# Patient Record
Sex: Female | Born: 1945
Health system: Southern US, Community
[De-identification: ages and names within clinical notes are randomized; demographics above are authoritative.]

## PROBLEM LIST (undated history)

## (undated) DIAGNOSIS — C4491 Basal cell carcinoma of skin, unspecified: Secondary | ICD-10-CM

## (undated) DIAGNOSIS — D122 Benign neoplasm of ascending colon: Secondary | ICD-10-CM

## (undated) DIAGNOSIS — D649 Anemia, unspecified: Secondary | ICD-10-CM

## (undated) DIAGNOSIS — M1990 Unspecified osteoarthritis, unspecified site: Secondary | ICD-10-CM

## (undated) DIAGNOSIS — R011 Cardiac murmur, unspecified: Secondary | ICD-10-CM

## (undated) DIAGNOSIS — E042 Nontoxic multinodular goiter: Secondary | ICD-10-CM

## (undated) DIAGNOSIS — R0600 Dyspnea, unspecified: Principal | ICD-10-CM

## (undated) DIAGNOSIS — K2100 Gastro-esophageal reflux disease with esophagitis, without bleeding: Secondary | ICD-10-CM

## (undated) DIAGNOSIS — R195 Other fecal abnormalities: Secondary | ICD-10-CM

## (undated) DIAGNOSIS — K219 Gastro-esophageal reflux disease without esophagitis: Secondary | ICD-10-CM

## (undated) DIAGNOSIS — H3530 Unspecified macular degeneration: Secondary | ICD-10-CM

## (undated) DIAGNOSIS — G4730 Sleep apnea, unspecified: Secondary | ICD-10-CM

## (undated) DIAGNOSIS — M810 Age-related osteoporosis without current pathological fracture: Secondary | ICD-10-CM

## (undated) HISTORY — DX: Sleep apnea, unspecified: G47.30

## (undated) HISTORY — DX: Basal cell carcinoma of skin, unspecified: C44.91

## (undated) HISTORY — DX: Cardiac murmur, unspecified: R01.1

## (undated) HISTORY — DX: Nontoxic multinodular goiter: E04.2

## (undated) HISTORY — DX: Gastro-esophageal reflux disease with esophagitis, without bleeding: K21.00

## (undated) HISTORY — DX: Anemia, unspecified: D64.9

## (undated) HISTORY — PX: OTHER SURGICAL HISTORY: SHX169

## (undated) HISTORY — DX: Other fecal abnormalities: R19.5

## (undated) HISTORY — DX: Unspecified macular degeneration: H35.30

## (undated) HISTORY — DX: Unspecified osteoarthritis, unspecified site: M19.90

## (undated) HISTORY — DX: Dyspnea, unspecified: R06.00

## (undated) HISTORY — DX: Age-related osteoporosis without current pathological fracture: M81.0

## (undated) HISTORY — DX: Gastro-esophageal reflux disease without esophagitis: K21.9

## (undated) HISTORY — DX: Benign neoplasm of ascending colon: D12.2

---

## 1986-05-26 HISTORY — PX: BREAST SURGERY: SHX581

## 2004-04-03 ENCOUNTER — Ambulatory Visit: Payer: Self-pay | Admitting: Internal Medicine

## 2004-05-26 HISTORY — PX: CARDIAC CATHETERIZATION: SHX172

## 2004-10-16 ENCOUNTER — Ambulatory Visit: Payer: Self-pay | Admitting: General Practice

## 2005-01-31 ENCOUNTER — Ambulatory Visit: Payer: Self-pay | Admitting: Cardiovascular Disease

## 2005-12-11 ENCOUNTER — Ambulatory Visit: Payer: Self-pay | Admitting: General Practice

## 2006-05-21 ENCOUNTER — Ambulatory Visit: Payer: Self-pay | Admitting: Gastroenterology

## 2006-12-24 ENCOUNTER — Ambulatory Visit: Payer: Self-pay | Admitting: Endocrinology

## 2008-04-12 ENCOUNTER — Emergency Department (HOSPITAL_COMMUNITY): Admission: EM | Admit: 2008-04-12 | Discharge: 2008-04-12 | Payer: Self-pay | Admitting: Emergency Medicine

## 2009-03-22 ENCOUNTER — Encounter (INDEPENDENT_AMBULATORY_CARE_PROVIDER_SITE_OTHER): Payer: Self-pay | Admitting: Internal Medicine

## 2009-03-22 ENCOUNTER — Ambulatory Visit: Payer: Self-pay | Admitting: Internal Medicine

## 2009-03-22 DIAGNOSIS — K449 Diaphragmatic hernia without obstruction or gangrene: Secondary | ICD-10-CM

## 2009-03-22 DIAGNOSIS — M1990 Unspecified osteoarthritis, unspecified site: Secondary | ICD-10-CM | POA: Insufficient documentation

## 2009-03-22 DIAGNOSIS — E042 Nontoxic multinodular goiter: Secondary | ICD-10-CM | POA: Insufficient documentation

## 2009-03-22 DIAGNOSIS — G4730 Sleep apnea, unspecified: Secondary | ICD-10-CM

## 2009-03-22 HISTORY — DX: Sleep apnea, unspecified: G47.30

## 2009-04-02 ENCOUNTER — Ambulatory Visit: Payer: Self-pay | Admitting: Internal Medicine

## 2009-04-02 LAB — CONVERTED CEMR LAB
ALT: 24 units/L (ref 0–35)
Alkaline Phosphatase: 65 units/L (ref 39–117)
BUN: 12 mg/dL (ref 6–23)
Basophils Relative: 0 % (ref 0–1)
Calcium: 9.3 mg/dL (ref 8.4–10.5)
Cholesterol: 172 mg/dL (ref 0–200)
Eosinophils Absolute: 0.2 10*3/uL (ref 0.0–0.7)
Eosinophils Relative: 3 % (ref 0–5)
Free T4: 1.32 ng/dL (ref 0.80–1.80)
HCT: 38.1 % (ref 36.0–46.0)
HDL: 58 mg/dL (ref >39)
LDL Cholesterol: 96 mg/dL (ref 0–99)
Lymphocytes Relative: 41 % (ref 12–46)
Lymphs Abs: 2 10*3/uL (ref 0.7–4.0)
MCHC: 30.4 g/dL (ref 30.0–36.0)
Monocytes Relative: 8 % (ref 3–12)
Neutro Abs: 2.3 10*3/uL (ref 1.7–7.7)
OCCULT 1: NEGATIVE
OCCULT 2: NEGATIVE
OCCULT 3: NEGATIVE
RBC: 4.35 M/uL (ref 3.87–5.11)
TSH: 2.787 microintl units/mL (ref 0.350–4.5)
Total CHOL/HDL Ratio: 3
Total Protein: 7.1 g/dL (ref 6.0–8.3)

## 2010-02-13 ENCOUNTER — Ambulatory Visit: Payer: Self-pay

## 2010-02-19 ENCOUNTER — Ambulatory Visit: Payer: Self-pay

## 2011-05-06 ENCOUNTER — Ambulatory Visit: Payer: Self-pay

## 2012-10-15 ENCOUNTER — Ambulatory Visit: Payer: Self-pay | Admitting: Family

## 2013-02-03 ENCOUNTER — Ambulatory Visit: Payer: Self-pay | Admitting: Gastroenterology

## 2013-09-28 ENCOUNTER — Encounter: Payer: Self-pay | Admitting: Internal Medicine

## 2013-09-28 ENCOUNTER — Ambulatory Visit (INDEPENDENT_AMBULATORY_CARE_PROVIDER_SITE_OTHER): Payer: Commercial Managed Care - HMO | Admitting: Internal Medicine

## 2013-09-28 VITALS — BP 146/71 | HR 60 | Temp 97.0°F | Ht 62.0 in | Wt 209.1 lb

## 2013-09-28 DIAGNOSIS — M1990 Unspecified osteoarthritis, unspecified site: Secondary | ICD-10-CM

## 2013-09-28 DIAGNOSIS — D649 Anemia, unspecified: Secondary | ICD-10-CM

## 2013-09-28 DIAGNOSIS — Z1239 Encounter for other screening for malignant neoplasm of breast: Secondary | ICD-10-CM

## 2013-09-28 DIAGNOSIS — Z862 Personal history of diseases of the blood and blood-forming organs and certain disorders involving the immune mechanism: Secondary | ICD-10-CM

## 2013-09-28 DIAGNOSIS — E041 Nontoxic single thyroid nodule: Secondary | ICD-10-CM

## 2013-09-28 DIAGNOSIS — J449 Chronic obstructive pulmonary disease, unspecified: Secondary | ICD-10-CM | POA: Insufficient documentation

## 2013-09-28 DIAGNOSIS — Z8639 Personal history of other endocrine, nutritional and metabolic disease: Secondary | ICD-10-CM

## 2013-09-28 DIAGNOSIS — R0600 Dyspnea, unspecified: Secondary | ICD-10-CM

## 2013-09-28 DIAGNOSIS — K219 Gastro-esophageal reflux disease without esophagitis: Secondary | ICD-10-CM

## 2013-09-28 DIAGNOSIS — G4730 Sleep apnea, unspecified: Secondary | ICD-10-CM

## 2013-09-28 DIAGNOSIS — J438 Other emphysema: Secondary | ICD-10-CM

## 2013-09-28 DIAGNOSIS — Z0000 Encounter for general adult medical examination without abnormal findings: Secondary | ICD-10-CM

## 2013-09-28 HISTORY — DX: Dyspnea, unspecified: R06.00

## 2013-09-28 LAB — CBC WITH DIFFERENTIAL/PLATELET
BASOS ABS: 0 10*3/uL (ref 0.0–0.1)
BASOS PCT: 0 % (ref 0–1)
EOS ABS: 0.1 10*3/uL (ref 0.0–0.7)
EOS PCT: 2 % (ref 0–5)
HCT: 35.6 % — ABNORMAL LOW (ref 36.0–46.0)
Hemoglobin: 11.7 g/dL — ABNORMAL LOW (ref 12.0–15.0)
Lymphocytes Relative: 28 % (ref 12–46)
Lymphs Abs: 1.8 10*3/uL (ref 0.7–4.0)
MCH: 27.9 pg (ref 26.0–34.0)
MCHC: 32.9 g/dL (ref 30.0–36.0)
MCV: 85 fL (ref 78.0–100.0)
Monocytes Absolute: 0.4 10*3/uL (ref 0.1–1.0)
Monocytes Relative: 6 % (ref 3–12)
NEUTROS ABS: 4.1 10*3/uL (ref 1.7–7.7)
Neutrophils Relative %: 64 % (ref 43–77)
PLATELETS: 262 10*3/uL (ref 150–400)
RBC: 4.19 MIL/uL (ref 3.87–5.11)
RDW: 14.9 % (ref 11.5–15.5)
WBC: 6.4 10*3/uL (ref 4.0–10.5)

## 2013-09-28 LAB — GLUCOSE, CAPILLARY: GLUCOSE-CAPILLARY: 86 mg/dL (ref 70–99)

## 2013-09-28 MED ORDER — DICLOFENAC SODIUM 75 MG PO TBEC: 75.0000 mg | DELAYED_RELEASE_TABLET | Freq: Two times a day (BID) | ORAL | Status: DC

## 2013-09-28 MED ORDER — OMEPRAZOLE 20 MG PO CPDR: 20.0000 mg | DELAYED_RELEASE_CAPSULE | Freq: Every day | ORAL | Status: DC

## 2013-09-28 MED ORDER — TIOTROPIUM BROMIDE MONOHYDRATE 18 MCG IN CAPS: 18.0000 ug | ORAL_CAPSULE | Freq: Every day | RESPIRATORY_TRACT | Status: DC

## 2013-09-28 MED ORDER — IPRATROPIUM-ALBUTEROL 0.5-2.5 (3) MG/3ML IN SOLN: RESPIRATORY_TRACT | Status: DC

## 2013-09-28 MED ORDER — ALBUTEROL SULFATE HFA 108 (90 BASE) MCG/ACT IN AERS: 2.0000 | INHALATION_SPRAY | Freq: Four times a day (QID) | RESPIRATORY_TRACT | Status: DC | PRN

## 2013-09-28 NOTE — Assessment & Plan Note (Addendum)
Patient reports that she had multiple nodules in her thyroid in the past. Not sure if she had any further work up. A nodule is palpable on exam today on the left thyroid lobe. Discussed with the attending regarding further management and plan.  Plans: Repeat US of the neck to check for thyroid nodules. Will follow up.

## 2013-09-28 NOTE — Assessment & Plan Note (Addendum)
We do not have her health records currently. Not sure how the diagnosis was made. But signs of emphysema on the CXR in 2009 and patient was on spiriva and albuterol in the past.  Plans: Refilled spiriva, albuterol inhalers. Gave prescription for duonebs as well. Will consider spirometry to assess her lung function.

## 2013-09-28 NOTE — Assessment & Plan Note (Addendum)
Patient states that she had never used BiPap as she couldn't sleep with it.  Plans: Recommended to reconsider BiPAP.

## 2013-09-28 NOTE — Assessment & Plan Note (Addendum)
Check routene labs - CBC, CMP, FBG.FLP. Referral to mammogram was made. Patient is uptodate on colonoscopy. Requested to get the reports of colonoscopy.

## 2013-09-28 NOTE — Progress Notes (Signed)
Subjective:   Patient ID: Madison Craig female   DOB: [birth year] 68 y.o.   MRN: [id number]  HPI: [name] is a 68 y.o. woman with PMH significant for  Emphysema, H/O thyroid nodule comes to the office to re-establish her care with the office.  Patient was seen here in 2010 but she states that she didn't have insurance then hence couldn't follow up with the office. Now that she has medicare, she wants to re-establish her care with the clinic.   Patient reports that she has DOE and has not been using her inhalers for the last 4-5 years. Patient is requesting refill of her inhalers.   Patient is complains of chronic knee, ankle, hip, shoulder pain and states that she takes ibuprofen for her chronic pains as needed. She is requesting for diclofenac instead of ibuprofen as they work better for her.   She states that she had a mammogram two years ago which was normal and is requesting for a referral for mammogram at [facility] in [place]. Patient reports that she had a colonoscopy in 2014 September which showed diverticuli.   She denies any other complaints during this office visit.    Past Medical History  Diagnosis Date  . Anemia   . Arthritis   . Emphysema of lung   . GERD (gastroesophageal reflux disease)   . Thyroid disease    Current Outpatient Prescriptions  Medication Sig Dispense Refill  . albuterol (PROVENTIL HFA;VENTOLIN HFA) 108 (90 BASE) MCG/ACT inhaler Inhale 2 puffs into the lungs every 6 (six) hours as needed for wheezing or shortness of breath.  1 Inhaler  2  . diclofenac (VOLTAREN) 75 MG EC tablet Take 1 tablet (75 mg total) by mouth 2 (two) times daily.  60 tablet  2  . ipratropium-albuterol (DUONEB) 0.5-2.5 (3) MG/3ML SOLN Take 3 ml by nebulization as needed for shortness of breath.  360 mL  3  . omeprazole (PRILOSEC) 20 MG capsule Take 1 capsule (20 mg total) by mouth daily.  30 capsule  2  . tiotropium (SPIRIVA HANDIHALER) 18 MCG inhalation  capsule Place 1 capsule (18 mcg total) into inhaler and inhale daily.  30 capsule  2   No current facility-administered medications for this visit.   Family History  Problem Relation Age of Onset  . Heart disease Father   . Diabetes Son    History   Social History  . Marital Status: Widowed    Spouse Name: N/A    Number of Children: N/A  . Years of Education: N/A   Social History Main Topics  . Smoking status: Former Smoker -- 2.00 packs/day    Types: Cigarettes    Quit date: 09/29/1978  . Smokeless tobacco: None  . Alcohol Use: No  . Drug Use: No  . Sexual Activity: None   Other Topics Concern  . None   Social History Narrative  . None   Review of Systems: Pertinent items are noted in HPI. Objective:  Physical Exam: Filed Vitals:   09/28/13 0844  BP: 146/71  Pulse: 60  Temp: 97 F (36.1 C)  TempSrc: Oral  Height: 5\' 2"  (1.575 m)  Weight: 209 lb 1.6 oz (94.847 kg)  SpO2: 99%   Constitutional: Vital signs reviewed.   Patient is a well-developed and well-nourished and is in no acute distress and cooperative with exam. Alert and oriented x3.  Head: Normocephalic and atraumatic Nose: No erythema or drainage noted.  Turbinates normal Mouth: no  erythema or exudates, MMM Eyes: PERRL, EOMI, conjunctivae normal, No scleral icterus.  Neck: Supple, Trachea midline normal ROM. One palpable thyroid nodule on the left side. No thyromegaly noted.  Cardiovascular: RRR, S1 normal, S2 normal, no MRG, pulses symmetric and intact bilaterally Pulmonary/Chest: normal respiratory effort, CTAB, no wheezes, rales, or rhonchi Abdominal: Soft. Non-tender, non-distended, bowel sounds are normal, no masses, organomegaly, or guarding present.  Musculoskeletal: No joint deformities, erythema, or stiffness, ROM full and no nontender Neurological: A&O x3, Strength is normal and symmetric bilaterally, cranial nerve II-XII are grossly intact, no focal motor deficit, sensory intact to light  touch bilaterally.  Skin: Warm, dry and intact.  Psychiatric: Normal mood and affect.    Assessment & Plan:

## 2013-09-28 NOTE — Patient Instructions (Signed)
Take all the medications as advised. Follow up with our office in 3 months. Please get the thyroid ultrasound done and we will follow up with the results.

## 2013-09-29 LAB — COMPLETE METABOLIC PANEL WITH GFR
ALK PHOS: 74 U/L (ref 39–117)
ALT: 16 U/L (ref 0–35)
AST: 17 U/L (ref 0–37)
Albumin: 4.1 g/dL (ref 3.5–5.2)
BILIRUBIN TOTAL: 0.5 mg/dL (ref 0.2–1.2)
BUN: 15 mg/dL (ref 6–23)
CALCIUM: 9.5 mg/dL (ref 8.4–10.5)
CO2: 29 mEq/L (ref 19–32)
Chloride: 102 mEq/L (ref 96–112)
Creat: 0.67 mg/dL (ref 0.50–1.10)
GFR, Est African American: >89 mL/min
GLUCOSE: 85 mg/dL (ref 70–99)
POTASSIUM: 4.3 meq/L (ref 3.5–5.3)
SODIUM: 137 meq/L (ref 135–145)
TOTAL PROTEIN: 7.2 g/dL (ref 6.0–8.3)

## 2013-09-29 LAB — LIPID PANEL
CHOLESTEROL: 140 mg/dL (ref 0–200)
HDL: 49 mg/dL (ref >39)
LDL CALC: 79 mg/dL (ref 0–99)
TRIGLYCERIDES: 59 mg/dL (ref <150)
Total CHOL/HDL Ratio: 2.9 Ratio
VLDL: 12 mg/dL (ref 0–40)

## 2013-09-29 NOTE — Progress Notes (Signed)
Case discussed with Dr. Boggala at the time of the visit.  We reviewed the resident's history and exam and pertinent patient test results.  I agree with the assessment, diagnosis, and plan of care documented in the resident's note. 

## 2013-09-30 ENCOUNTER — Ambulatory Visit: Payer: Self-pay | Admitting: Internal Medicine

## 2013-10-03 ENCOUNTER — Other Ambulatory Visit: Payer: Self-pay | Admitting: Internal Medicine

## 2013-10-03 DIAGNOSIS — E669 Obesity, unspecified: Secondary | ICD-10-CM

## 2013-10-25 ENCOUNTER — Ambulatory Visit: Payer: Self-pay

## 2014-01-11 ENCOUNTER — Encounter: Payer: Self-pay | Admitting: Internal Medicine

## 2014-01-11 ENCOUNTER — Ambulatory Visit (INDEPENDENT_AMBULATORY_CARE_PROVIDER_SITE_OTHER): Payer: Commercial Managed Care - HMO | Admitting: Internal Medicine

## 2014-01-11 VITALS — BP 138/77 | HR 62 | Temp 97.1°F | Wt 211.0 lb

## 2014-01-11 DIAGNOSIS — K219 Gastro-esophageal reflux disease without esophagitis: Secondary | ICD-10-CM

## 2014-01-11 DIAGNOSIS — Z23 Encounter for immunization: Secondary | ICD-10-CM

## 2014-01-11 DIAGNOSIS — D500 Iron deficiency anemia secondary to blood loss (chronic): Secondary | ICD-10-CM | POA: Insufficient documentation

## 2014-01-11 DIAGNOSIS — H9190 Unspecified hearing loss, unspecified ear: Secondary | ICD-10-CM

## 2014-01-11 DIAGNOSIS — E041 Nontoxic single thyroid nodule: Secondary | ICD-10-CM

## 2014-01-11 DIAGNOSIS — J438 Other emphysema: Secondary | ICD-10-CM

## 2014-01-11 DIAGNOSIS — H9193 Unspecified hearing loss, bilateral: Secondary | ICD-10-CM

## 2014-01-11 DIAGNOSIS — D649 Anemia, unspecified: Secondary | ICD-10-CM

## 2014-01-11 DIAGNOSIS — H3530 Unspecified macular degeneration: Secondary | ICD-10-CM | POA: Insufficient documentation

## 2014-01-11 HISTORY — DX: Unspecified macular degeneration: H35.30

## 2014-01-11 LAB — TSH: TSH: 1.767 u[IU]/mL (ref 0.350–4.500)

## 2014-01-11 LAB — FERRITIN: Ferritin: 8 ng/mL — ABNORMAL LOW (ref 10–291)

## 2014-01-11 LAB — T4, FREE: FREE T4: 1.39 ng/dL (ref 0.80–1.80)

## 2014-01-11 NOTE — Assessment & Plan Note (Addendum)
Assessment: Symptoms are well controlled on omeprazole 40 mg daily.    Plan: Continue omeprazole 40 mg daily.  

## 2014-01-11 NOTE — Assessment & Plan Note (Signed)
Assessment: Patient reports that this is a chronic problem that started years ago, and that she has had a prior hearing test done.  Plan: Try to obtain outside records a prior workup.

## 2014-01-11 NOTE — Assessment & Plan Note (Addendum)
Lab Results  Component Value Date   HGB 11.7* 09/28/2013   HGB 11.6* 04/02/2009   MCV 85.0 09/28/2013   MCV 87.6 04/02/2009     Assessment: Patient has mild normocytic anemia.  She is asymptomatic.  Plan: Check ferritin level today.

## 2014-01-11 NOTE — Progress Notes (Signed)
   Subjective:    Patient ID: Madison Craig, female    DOB: [birth year], 68 y.o.   MRN: [id number]  HPI This is the first visit to my continuity clinic for Madison Craig, a patient who was previously followed in our clinic in 2010, who was seen here in May by Dr. Eyvonne Mechanic in order to re-establish care here.  Today she has no acute complaints.  She has several chronic complaints including: stable exertional dyspnea; occasional leg swelling which resolves when she elevates her legs at night; chronic hearing loss for more than 7 years which she attributes to having worked in [employer], worse on the right side; chronic joint pains involving ankles, knees, wrists/hands, and arms/shoulders.  She reports a history of macular degeneration followed by an ophthalmologist at [facility] in [place]; she reports that she was last seen there about 1 year ago.  She reports occasional forgetfulness but nothing significant.  She reports a history of obstructive sleep apnea diagnosed years ago by a sleep study in [place]; she reports that she has a BiPAP machine but has never used it because she was unable to sleep with it on.  She has GERD, and says her symptoms are well controlled on omeprazole 40 mg daily.  She has not been using her Spiriva regularly, and says she uses it mainly when she is having an increase in her dyspnea which occurs more in the fall.  She uses her albuterol inhaler on an as-needed basis only.  I reviewed and updated the past medical history, past surgical history, family history, and social history  Review of Systems  Constitutional: Negative for fever, chills and diaphoresis.  HENT: Positive for hearing loss. Negative for ear discharge and ear pain.   Eyes: Negative for pain.  Respiratory: Positive for shortness of breath (Chronic exertional dyspnea). Negative for cough and wheezing.   Cardiovascular: Positive for leg swelling (Occasional). Negative for chest pain.  Gastrointestinal:  Negative for nausea, vomiting, abdominal pain, diarrhea, blood in stool and anal bleeding.  Genitourinary: Negative for dysuria, urgency and difficulty urinating.  Musculoskeletal: Positive for arthralgias (Ankles, knees, wrists/hands, arms/shoulders) and myalgias.  Skin: Negative for rash.  Neurological: Negative for dizziness, syncope, weakness and numbness.       Objective:   Physical Exam  Constitutional: No distress.  Cardiovascular: Normal rate, regular rhythm and normal heart sounds.  Exam reveals no gallop and no friction rub.   No murmur heard. Pulmonary/Chest: Effort normal and breath sounds normal. No respiratory distress. She has no wheezes. She has no rales.  Abdominal: Soft. Bowel sounds are normal. There is no tenderness.  Musculoskeletal: She exhibits no edema.       Assessment & Plan:

## 2014-01-11 NOTE — Assessment & Plan Note (Addendum)
Assessment: Patient reports that this is followed by [facility] in [place].  Plan: I advised patient to followup with her ophthalmologist.

## 2014-01-11 NOTE — Assessment & Plan Note (Addendum)
Assessment: Patient reports reasonable control of her symptoms; she uses her albuterol inhaler on an as-needed basis; she uses her Spiriva intermittently depending on the level of her symptoms; she has not recently been using the Combivent nebs although she has it available.  She reports that she has never had pulmonary function studies.  Plan: Continue inhaled bronchodilator regimen.  Refer for pulmonary function tests.

## 2014-01-11 NOTE — Assessment & Plan Note (Addendum)
Assessment: Patient has a history of multiple thyroid nodules.  Thyroid ultrasound was ordered at the time of her last visit, but has not been done.  Plan: Check TSH and free T4; obtain thyroid ultrasound as previously ordered.

## 2014-01-11 NOTE — Patient Instructions (Signed)
A referral has been made for pulmonary function tests. A referral has been made for a thyroid ultrasound. Please ask your pharmacist what the monthly co-pay would be for an ipratropium (Atrovent) inhaler.

## 2014-01-20 ENCOUNTER — Telehealth: Payer: Self-pay | Admitting: Internal Medicine

## 2014-01-20 DIAGNOSIS — D649 Anemia, unspecified: Secondary | ICD-10-CM

## 2014-01-20 MED ORDER — FERROUS SULFATE 325 (65 FE) MG PO TABS: 325.0000 mg | ORAL_TABLET | Freq: Two times a day (BID) | ORAL | Status: DC

## 2014-01-20 NOTE — Telephone Encounter (Signed)
Telephone Contact Note  Lab Results  Component Value Date   HGB 11.7* 09/28/2013   MCV 85.0 09/28/2013    Lab Results  Component Value Date   FERRITIN 8* 01/11/2014     Assessment: Patient has mild normocytic anemia.  Ferritin is low.    Plan: Plan is start ferrous sulfate 325 mg twice a day.  I called patient at home and discussed this with her.

## 2014-01-20 NOTE — Assessment & Plan Note (Addendum)
Telephone Contact Note  Lab Results  Component Value Date   HGB 11.7* 09/28/2013   MCV 85.0 09/28/2013    Lab Results  Component Value Date   FERRITIN 8* 01/11/2014     Assessment: Patient has mild normocytic anemia.  Ferritin is low.    Plan: Plan is start ferrous sulfate 325 mg twice a day.  I called patient at home and discussed this with her.

## 2014-01-25 ENCOUNTER — Other Ambulatory Visit: Payer: Self-pay | Admitting: Internal Medicine

## 2014-01-25 ENCOUNTER — Telehealth: Payer: Self-pay | Admitting: Internal Medicine

## 2014-01-25 ENCOUNTER — Ambulatory Visit (HOSPITAL_COMMUNITY)
Admission: RE | Admit: 2014-01-25 | Discharge: 2014-01-25 | Disposition: A | Discharge status: Home / Self Care | Payer: Medicare PPO | Source: Ambulatory Visit | Attending: Internal Medicine | Admitting: Internal Medicine

## 2014-01-25 DIAGNOSIS — E042 Nontoxic multinodular goiter: Secondary | ICD-10-CM | POA: Insufficient documentation

## 2014-01-25 DIAGNOSIS — E041 Nontoxic single thyroid nodule: Secondary | ICD-10-CM

## 2014-01-25 NOTE — Telephone Encounter (Signed)
Called and spoke with [name] regarding the results of her thyroid ultrasound.  The ultrasound showed a nodule that does meet criteria for biopsy.  I have ordered an US guided biopsy and [name] is aware someone will be calling her to schedule this study.   I also spoke with [facility] who will assist in scheduling.   [name] also was concerned that she had not gotten her iron prescription from [facility].  I called and confirmed that the prescription was there and relayed the message to [name].   Gilles Chiquito, MD

## 2014-02-06 ENCOUNTER — Ambulatory Visit (HOSPITAL_COMMUNITY)
Admission: RE | Admit: 2014-02-06 | Discharge: 2014-02-06 | Disposition: A | Discharge status: Home / Self Care | Payer: Medicare PPO | Source: Ambulatory Visit | Attending: Internal Medicine | Admitting: Internal Medicine

## 2014-02-06 DIAGNOSIS — E041 Nontoxic single thyroid nodule: Secondary | ICD-10-CM | POA: Diagnosis not present

## 2014-02-06 MED ORDER — LIDOCAINE HCL (PF) 1 % IJ SOLN
INTRAMUSCULAR | Status: AC
  Filled 2014-02-06: qty 10

## 2014-02-06 NOTE — Procedures (Signed)
Left thyroid nodule Enlarging  Inrad needle 22 g x 3  Pt tolerated well

## 2014-02-09 ENCOUNTER — Ambulatory Visit (HOSPITAL_COMMUNITY)
Admission: RE | Admit: 2014-02-09 | Discharge: 2014-02-09 | Disposition: A | Discharge status: Home / Self Care | Payer: Medicare PPO | Source: Ambulatory Visit | Attending: Internal Medicine | Admitting: Internal Medicine

## 2014-02-09 ENCOUNTER — Telehealth: Payer: Self-pay | Admitting: Internal Medicine

## 2014-02-09 DIAGNOSIS — R0609 Other forms of dyspnea: Secondary | ICD-10-CM | POA: Diagnosis not present

## 2014-02-09 DIAGNOSIS — J438 Other emphysema: Secondary | ICD-10-CM | POA: Insufficient documentation

## 2014-02-09 DIAGNOSIS — R062 Wheezing: Secondary | ICD-10-CM | POA: Diagnosis not present

## 2014-02-09 DIAGNOSIS — Z87891 Personal history of nicotine dependence: Secondary | ICD-10-CM | POA: Diagnosis not present

## 2014-02-09 DIAGNOSIS — R0989 Other specified symptoms and signs involving the circulatory and respiratory systems: Secondary | ICD-10-CM | POA: Insufficient documentation

## 2014-02-09 LAB — PULMONARY FUNCTION TEST
DL/VA % PRED: 135 %
DL/VA: 5.75 ml/min/mmHg/L
DLCO COR % PRED: 102 %
DLCO COR: 19.39 ml/min/mmHg
DLCO UNC % PRED: 102 %
DLCO UNC: 19.39 ml/min/mmHg
FEF 25-75 POST: 1.96 L/s
FEF 25-75 Pre: 1.17 L/sec
FEF2575-%Change-Post: 67 %
FEF2575-%PRED-POST: 106 %
FEF2575-%PRED-PRE: 63 %
FEV1-%Change-Post: 10 %
FEV1-%PRED-POST: 94 %
FEV1-%Pred-Pre: 85 %
FEV1-POST: 1.9 L
FEV1-Pre: 1.71 L
FEV1FVC-%Change-Post: 5 %
FEV1FVC-%Pred-Pre: 95 %
FEV6-%Change-Post: 6 %
FEV6-%PRED-PRE: 90 %
FEV6-%Pred-Post: 96 %
FEV6-POST: 2.44 L
FEV6-PRE: 2.28 L
FEV6FVC-%CHANGE-POST: 1 %
FEV6FVC-%PRED-POST: 105 %
FEV6FVC-%PRED-PRE: 103 %
FVC-%Change-Post: 4 %
FVC-%PRED-POST: 92 %
FVC-%PRED-PRE: 87 %
FVC-PRE: 2.32 L
FVC-Post: 2.44 L
POST FEV6/FVC RATIO: 100 %
Post FEV1/FVC ratio: 78 %
Pre FEV1/FVC ratio: 74 %
Pre FEV6/FVC Ratio: 98 %
RV % pred: 116 %
RV: 2.23 L
TLC % pred: 100 %
TLC: 4.46 L

## 2014-02-09 MED ORDER — ALBUTEROL SULFATE (2.5 MG/3ML) 0.083% IN NEBU
2.5000 mg | INHALATION_SOLUTION | Freq: Once | RESPIRATORY_TRACT | Status: AC
  Administered 2014-02-09: 2.5 mg via RESPIRATORY_TRACT

## 2014-02-09 NOTE — Telephone Encounter (Signed)
I called the patient and discussed the results of her thyroid ultrasound which showed a multinodular goiter, and the results of the fine-needle aspiration of the 1.5 cm nodule in the inferior left thyroid lobe, which showed findings consistent with a nonneoplastic goiter.  I also briefly discussed the preliminary report of her pulmonary function studies, and will call her when the final report is available for review.  I will be away next week and will call after I return.

## 2014-04-05 ENCOUNTER — Ambulatory Visit (INDEPENDENT_AMBULATORY_CARE_PROVIDER_SITE_OTHER): Payer: Commercial Managed Care - HMO | Admitting: Internal Medicine

## 2014-04-05 ENCOUNTER — Encounter: Payer: Self-pay | Admitting: Internal Medicine

## 2014-04-05 VITALS — BP 142/61 | HR 66 | Temp 98.2°F | Ht 63.0 in | Wt 205.3 lb

## 2014-04-05 DIAGNOSIS — Z0000 Encounter for general adult medical examination without abnormal findings: Secondary | ICD-10-CM

## 2014-04-05 DIAGNOSIS — E041 Nontoxic single thyroid nodule: Secondary | ICD-10-CM

## 2014-04-05 DIAGNOSIS — D649 Anemia, unspecified: Secondary | ICD-10-CM

## 2014-04-05 DIAGNOSIS — Z13820 Encounter for screening for osteoporosis: Secondary | ICD-10-CM

## 2014-04-05 DIAGNOSIS — E042 Nontoxic multinodular goiter: Secondary | ICD-10-CM

## 2014-04-05 DIAGNOSIS — Z23 Encounter for immunization: Secondary | ICD-10-CM

## 2014-04-05 DIAGNOSIS — R0600 Dyspnea, unspecified: Secondary | ICD-10-CM

## 2014-04-05 LAB — CBC WITH DIFFERENTIAL/PLATELET
Basophils Absolute: 0 10*3/uL (ref 0.0–0.1)
Basophils Relative: 0 % (ref 0–1)
Eosinophils Absolute: 0.1 10*3/uL (ref 0.0–0.7)
Eosinophils Relative: 3 % (ref 0–5)
HCT: 39.3 % (ref 36.0–46.0)
Hemoglobin: 12.8 g/dL (ref 12.0–15.0)
LYMPHS ABS: 1.8 10*3/uL (ref 0.7–4.0)
LYMPHS PCT: 36 % (ref 12–46)
MCH: 28.8 pg (ref 26.0–34.0)
MCHC: 32.6 g/dL (ref 30.0–36.0)
MCV: 88.5 fL (ref 78.0–100.0)
Monocytes Absolute: 0.3 10*3/uL (ref 0.1–1.0)
Monocytes Relative: 7 % (ref 3–12)
NEUTROS ABS: 2.6 10*3/uL (ref 1.7–7.7)
Neutrophils Relative %: 54 % (ref 43–77)
Platelets: 226 10*3/uL (ref 150–400)
RBC: 4.44 MIL/uL (ref 3.87–5.11)
RDW: 13.8 % (ref 11.5–15.5)
WBC: 4.9 10*3/uL (ref 4.0–10.5)

## 2014-04-05 LAB — FERRITIN: Ferritin: 21 ng/mL (ref 10–291)

## 2014-04-05 LAB — BASIC METABOLIC PANEL WITH GFR
BUN: 13 mg/dL (ref 6–23)
CHLORIDE: 100 meq/L (ref 96–112)
CO2: 27 mEq/L (ref 19–32)
Calcium: 9.6 mg/dL (ref 8.4–10.5)
Creat: 0.83 mg/dL (ref 0.50–1.10)
GFR, EST AFRICAN AMERICAN: 85 mL/min
GFR, EST NON AFRICAN AMERICAN: 74 mL/min
Glucose, Bld: 97 mg/dL (ref 70–99)
Potassium: 4.6 mEq/L (ref 3.5–5.3)
SODIUM: 139 meq/L (ref 135–145)

## 2014-04-05 LAB — PRO B NATRIURETIC PEPTIDE: Pro B Natriuretic peptide (BNP): 24.03 pg/mL (ref <126)

## 2014-04-05 MED ORDER — FERROUS SULFATE 325 (65 FE) MG PO TABS: 325.0000 mg | ORAL_TABLET | Freq: Two times a day (BID) | ORAL | Status: DC

## 2014-04-05 MED ORDER — ZOSTER VACCINE LIVE 19400 UNT/0.65ML ~~LOC~~ SOLR: 0.6500 mL | Freq: Once | SUBCUTANEOUS | Status: DC

## 2014-04-05 MED ORDER — IPRATROPIUM BROMIDE HFA 17 MCG/ACT IN AERS: 2.0000 | INHALATION_SPRAY | Freq: Four times a day (QID) | RESPIRATORY_TRACT | Status: DC | PRN

## 2014-04-05 NOTE — Patient Instructions (Addendum)
Stop Spiriva. Start ipratropium (Atrovent) inhaler 2 puffs every 6 hours as needed for wheezing or shortness of breath. Please schedule an appointment with your ophthalmologist (eye doctor). An echocardiogram has been ordered to evaluate your heart function and valves. A bone density (DXA) scan has been ordered to screen for osteoporosis.

## 2014-04-05 NOTE — Progress Notes (Signed)
   Subjective:    Patient ID: Madison Craig, female    DOB: 05-01-1946, 68 y.o.   MRN: [id number]  HPI Patient returns for follow-up of her dyspnea, anemia, and other chronic problems.  She reports that she has been doing well since her last visit, with no new complaints.  She has stable exertional dyspnea with some dyspnea at rest.  She has not been using Spiriva for some time because of the cost; she occasionally uses her albuterol inhaler or her Duonebs but not frequently.  She takes her omeprazole regularly, and reports that she does have reflux symptoms whenever she forgets to take omeprazole.  She has not recently been taking her ferrous sulfate.  She uses diclofenac on an as-needed basis.   Review of Systems  Constitutional: Negative for fever, chills, diaphoresis and appetite change.  Respiratory: Positive for shortness of breath (Chronic, stable) and wheezing (Occasional). Negative for cough.   Cardiovascular: Positive for leg swelling (Mild stable ankle edema after she has been on her feet). Negative for chest pain.  Gastrointestinal: Negative for nausea, vomiting, abdominal pain, blood in stool and anal bleeding.  Genitourinary: Negative for dysuria and difficulty urinating.    I reviewed and updated the medication list, allergies, past medical history, past surgical history, family history, and social history.     Objective:   Physical Exam  Constitutional: No distress.  Cardiovascular: Normal rate, regular rhythm and normal heart sounds.  Exam reveals no gallop and no friction rub.   No murmur heard. Pulmonary/Chest: Effort normal and breath sounds normal. No respiratory distress. She has no wheezes. She has no rales.  Abdominal: Soft. Bowel sounds are normal. She exhibits no distension. There is no tenderness. There is no rebound and no guarding.  Musculoskeletal: She exhibits no edema.       Assessment & Plan:

## 2014-04-05 NOTE — Assessment & Plan Note (Addendum)
Assessment: The patient carries a label of emphysema, but pulmonary function testing done on 02/09/2014 showed minimal airway obstruction with a lack of response to bronchodilators; her FEV1 was normal, the FEV1/FVC ratio and the FEF 25-75% were reduced; the airway resistance was normal; lung volumes were within normal limits; the diffusing capacity was high for the measured volumes.  The conclusion of the pulmonary function testing was that she has minimal obstructive airways disease, slight response to bronchodilator, and increased diffusing capacity which was felt to be consistent with asthma or a cardiovascular process such as left heart failure or shunting.  Plan: Patient appears to get some benefit clinically from her inhaled bronchodilators, but she cannot afford Spiriva.  The plan is to stop Spiriva and start ipratropium (Atrovent) inhaler 2 puffs every 6 hours at as needed; continue albuterol 2 puffs every 6 hours as needed (I emphasized to her that the albuterol is for rescue inhaler); obtain 2-D echocardiogram.  I discussed patient by phone with cardiologist Dr. Haroldine Laws, and he advised getting a 2-D echocardiogram with microcavitation bubble study, with subsequent workup that would include a cardiopulmonary exercise test (CPX test) if the echocardiogram is unrevealing.  He stated that he would be happy to see her in consultation if needed.

## 2014-04-05 NOTE — Assessment & Plan Note (Deleted)
A flu shot was given today.  I discussed the risks/benefits of Zostavax with patient and provided the vaccine information statement for her to review; I also provided a prescription for the Zostavax to be administered by her pharmacy.  Pneumovax will be due one year after Prevnar, which was given on 01/11/2014.  A DEXA scan was ordered today.

## 2014-04-05 NOTE — Assessment & Plan Note (Signed)
A flu shot was given today.  I discussed the risks/benefits of Zostavax with patient and provided the vaccine information statement for her to review; I also provided a prescription for the Zostavax to be administered by her pharmacy.  Pneumovax will be due one year after Prevnar, which was given on 01/11/2014.  A DEXA scan was ordered today.

## 2014-04-05 NOTE — Addendum Note (Signed)
Addended by: Bertha Stakes on: 04/05/2014 03:59 PM   Modules accepted: Orders

## 2014-04-05 NOTE — Assessment & Plan Note (Signed)
Lab Results  Component Value Date   TSH 1.767 01/11/2014   FREET4 1.39 01/11/2014     Assessment: Thyroid ultrasound on 01/25/2014 showed a multinodular goiter, with a 1.5 cm nodule in the inferior left thyroid lobe that met the criteria for ultrasound-guided biopsy.  On 02/06/2014 an ultrasound guided needle aspirate biopsy was performed of the left inferior thyroid nodule; cytopathology findings were consistent with non-neoplastic goiter.   Plan: I discussed the results with patient again today; these were previously discussed by phone with her.  No further workup is indicated at this time.

## 2014-04-05 NOTE — Assessment & Plan Note (Signed)
Lab Results  Component Value Date   HGB 12.8 04/05/2014   FERRITIN 21 04/05/2014     Assessment: Hemoglobin is stable; ferritin is still low.  Plan: Continue ferrous sulfate 325 mg twice a day.

## 2014-04-14 ENCOUNTER — Other Ambulatory Visit: Payer: Self-pay | Admitting: *Deleted

## 2014-04-14 MED ORDER — DICLOFENAC SODIUM 75 MG PO TBEC: 75.0000 mg | DELAYED_RELEASE_TABLET | Freq: Every day | ORAL | Status: DC

## 2014-04-25 ENCOUNTER — Ambulatory Visit (HOSPITAL_COMMUNITY)
Admission: RE | Admit: 2014-04-25 | Discharge: 2014-04-25 | Disposition: A | Discharge status: Home / Self Care | Payer: Commercial Managed Care - HMO | Source: Ambulatory Visit | Attending: Internal Medicine | Admitting: Internal Medicine

## 2014-04-25 DIAGNOSIS — R0600 Dyspnea, unspecified: Secondary | ICD-10-CM | POA: Insufficient documentation

## 2014-04-25 DIAGNOSIS — I517 Cardiomegaly: Secondary | ICD-10-CM

## 2014-04-25 NOTE — Progress Notes (Signed)
  Echocardiogram 2D Echocardiogram has been performed.  Darlina Sicilian M 04/25/2014, 9:46 AM

## 2014-07-05 ENCOUNTER — Ambulatory Visit (HOSPITAL_COMMUNITY)
Admission: RE | Admit: 2014-07-05 | Discharge: 2014-07-05 | Disposition: A | Discharge status: Home / Self Care | Payer: Commercial Managed Care - HMO | Source: Ambulatory Visit | Attending: Internal Medicine | Admitting: Internal Medicine

## 2014-07-05 ENCOUNTER — Ambulatory Visit (INDEPENDENT_AMBULATORY_CARE_PROVIDER_SITE_OTHER): Payer: Commercial Managed Care - HMO | Admitting: Internal Medicine

## 2014-07-05 ENCOUNTER — Encounter: Payer: Self-pay | Admitting: Internal Medicine

## 2014-07-05 VITALS — BP 132/69 | HR 66 | Temp 98.0°F | Wt 207.0 lb

## 2014-07-05 DIAGNOSIS — M19011 Primary osteoarthritis, right shoulder: Secondary | ICD-10-CM | POA: Diagnosis not present

## 2014-07-05 DIAGNOSIS — K219 Gastro-esophageal reflux disease without esophagitis: Secondary | ICD-10-CM

## 2014-07-05 DIAGNOSIS — M170 Bilateral primary osteoarthritis of knee: Secondary | ICD-10-CM

## 2014-07-05 DIAGNOSIS — Z0000 Encounter for general adult medical examination without abnormal findings: Secondary | ICD-10-CM

## 2014-07-05 DIAGNOSIS — R0602 Shortness of breath: Secondary | ICD-10-CM | POA: Insufficient documentation

## 2014-07-05 DIAGNOSIS — R0600 Dyspnea, unspecified: Secondary | ICD-10-CM

## 2014-07-05 DIAGNOSIS — M1990 Unspecified osteoarthritis, unspecified site: Secondary | ICD-10-CM | POA: Diagnosis not present

## 2014-07-05 DIAGNOSIS — M19071 Primary osteoarthritis, right ankle and foot: Secondary | ICD-10-CM | POA: Diagnosis not present

## 2014-07-05 DIAGNOSIS — M19072 Primary osteoarthritis, left ankle and foot: Secondary | ICD-10-CM | POA: Diagnosis not present

## 2014-07-05 DIAGNOSIS — M19012 Primary osteoarthritis, left shoulder: Secondary | ICD-10-CM

## 2014-07-05 DIAGNOSIS — Z780 Asymptomatic menopausal state: Secondary | ICD-10-CM

## 2014-07-05 LAB — BASIC METABOLIC PANEL WITH GFR
BUN: 15 mg/dL (ref 6–23)
CALCIUM: 9.2 mg/dL (ref 8.4–10.5)
CO2: 28 mEq/L (ref 19–32)
CREATININE: 0.93 mg/dL (ref 0.50–1.10)
Chloride: 103 mEq/L (ref 96–112)
GFR, EST NON AFRICAN AMERICAN: 64 mL/min
GFR, Est African American: 74 mL/min
Glucose, Bld: 91 mg/dL (ref 70–99)
Potassium: 4.4 mEq/L (ref 3.5–5.3)
Sodium: 140 mEq/L (ref 135–145)

## 2014-07-05 MED ORDER — DICLOFENAC SODIUM 75 MG PO TBEC: 75.0000 mg | DELAYED_RELEASE_TABLET | Freq: Every day | ORAL | Status: DC | PRN

## 2014-07-05 MED ORDER — TIOTROPIUM BROMIDE MONOHYDRATE 18 MCG IN CAPS: 18.0000 ug | ORAL_CAPSULE | Freq: Every day | RESPIRATORY_TRACT | Status: DC

## 2014-07-05 NOTE — Assessment & Plan Note (Signed)
I previously gave patient a prescription for Zostavax, but she did not have the vaccine administered at a pharmacy.  I offered today to provide another prescription, but she declined saying that she had decided not to get the Zostavax vaccine.  She would prefer to have her DEXA bone density scan done at [facility], so I reordered that study today.

## 2014-07-05 NOTE — Assessment & Plan Note (Signed)
Assessment: Patient reports chronic joint pain involving both shoulders, both knees, and both ankles.  She reports good control of her symptoms on diclofenac EC 75 mg daily, which she has been taking on a fairly regular basis and which she can afford when purchased at [facility].  She denies any GI symptoms to suggest upper GI side effects.  I discussed with her the potential risks of long-term NSAID use, but given her preference for diclofenac and its affordability, I will continue that medication.  I did encourage her if possible to limit the use of diclofenac to an as-needed basis rather than taking it on a daily basis chronically.  Plan: Continue diclofenac EC 75 mg daily as needed for pain; will check a basic metabolic panel today to monitor for possible renal toxicity.  Continue omeprazole for GI protection.

## 2014-07-05 NOTE — Addendum Note (Signed)
Addended by: Marcelino Duster on: 07/05/2014 09:59 AM   Modules accepted: Orders

## 2014-07-05 NOTE — Patient Instructions (Signed)
A referral has been made for a DEXA bone density scan at [facility].

## 2014-07-05 NOTE — Progress Notes (Signed)
   Subjective:    Patient ID: Madison Craig, female    DOB: 1945/08/21, 69 y.o.   MRN: [id number]  HPI Patient returns for management of her chronic exertional dyspnea, osteoarthritis, GERD, and other chronic medical problems.  Today she has no acute complaints.  She reports that her exertional dyspnea is stable; following her last visit, she did not get her ipratropium HFA filled because of the cost.  She prefers to go back to Spiriva in combination with albuterol HFA when necessary.  She has a nebulizer which she uses very infrequently.  She otherwise reports that she has been compliant with her medications.  Her GERD symptoms are well controlled on her current dose of omeprazole.  She has been using the diclofenac on a daily basis for her chronic arthritis pain, and reports adequate pain relief on the diclofenac. Upon further discussion of her prior workup in [facility], patient reports that she had a negative cardiac catheterization done around 2007 at [facility].  She was also previously followed by [facility].    Review of Systems  Respiratory: Positive for shortness of breath (Stable exertional dyspnea). Negative for wheezing.   Cardiovascular: Positive for leg swelling (Mild ankle swelling after being on feet; goes away at night). Negative for chest pain and palpitations.  Gastrointestinal: Negative for nausea, vomiting and blood in stool.  Genitourinary: Negative for dysuria, frequency and difficulty urinating.  Musculoskeletal: Positive for arthralgias (Chronic affecting shoulders, knees and ankles). Negative for back pain.  Neurological: Negative for dizziness, seizures and syncope.  Psychiatric/Behavioral: Negative for sleep disturbance and dysphoric mood.    I reviewed and updated the medication list, allergies, past medical history, past surgical history, family history, and social history.      Objective:   Physical Exam    Constitutional: No distress.  Cardiovascular: Normal rate and regular rhythm.  Exam reveals no gallop and no friction rub.   No murmur heard. No lower extremity edema  Pulmonary/Chest: Effort normal. She has rales (Few bibasilar crackles).  Abdominal: Soft. Bowel sounds are normal. She exhibits no distension. There is no tenderness. There is no rebound and no guarding.        Assessment & Plan:

## 2014-07-05 NOTE — Assessment & Plan Note (Signed)
Assessment: Patient is doing well on omeprazole 40 mg daily.    Plan: Continue omeprazole 40 mg daily.   

## 2014-07-05 NOTE — Assessment & Plan Note (Addendum)
Assessment: Pulmonary function testing done on 02/09/2014 showed minimal obstructive airways disease, slight response to bronchodilator, and increased diffusing capacity which was felt to be consistent with asthma or a cardiovascular process such as left heart failure or shunting.  A 2-D echocardiogram with bubble study done 04/25/2014 showed normal left ventricular systolic function with an estimated ejection fraction of 55% to 60%, normal wall motion, and normal left ventricular diastolic function parameters; there was no atrial septal defect or patent foramen ovale identified.  A chest x-ray today showed chronic bronchitic change without acute superimposed disease.  Plan: Patient does appear to get some clinical benefit from inhaled bronchodilators; she requested today that I change her back from ipratropium HFA to Spiriva, which is more affordable with her insurance plan.  Will also continue albuterol HFA 2 puffs every 6 hours as needed.  Patient also has DuoNebs which she uses very infrequently; I advised her not to use Spiriva on days when she is using the Duonebs.  I again discussed with patient the option of referral to cardiologist Dr. Haroldine Laws for workup that might include a cardiopulmonary exercise test, or alternatively referral to a pulmonologist.  However, she feels that her exertional dyspnea is chronic and stable, and she declined referral at this time.  She agreed to let us know if her symptoms worsen or she changes her mind about referral.  Today she reported a history of prior cardiac catheterization done at [facility], and we have requested copies of records from [facility] and from [facility].

## 2014-08-09 ENCOUNTER — Encounter: Payer: Self-pay | Admitting: *Deleted

## 2014-08-16 ENCOUNTER — Ambulatory Visit: Payer: Self-pay

## 2014-08-16 DIAGNOSIS — E2839 Other primary ovarian failure: Secondary | ICD-10-CM | POA: Diagnosis not present

## 2014-08-16 DIAGNOSIS — M810 Age-related osteoporosis without current pathological fracture: Secondary | ICD-10-CM | POA: Diagnosis not present

## 2014-09-18 ENCOUNTER — Encounter: Payer: Self-pay | Admitting: Internal Medicine

## 2014-09-18 DIAGNOSIS — M810 Age-related osteoporosis without current pathological fracture: Secondary | ICD-10-CM | POA: Insufficient documentation

## 2014-09-18 HISTORY — DX: Age-related osteoporosis without current pathological fracture: M81.0

## 2014-09-22 ENCOUNTER — Telehealth: Payer: Self-pay | Admitting: Internal Medicine

## 2014-09-22 ENCOUNTER — Encounter: Payer: Self-pay | Admitting: Internal Medicine

## 2014-09-22 DIAGNOSIS — M810 Age-related osteoporosis without current pathological fracture: Secondary | ICD-10-CM

## 2014-09-22 MED ORDER — CALCIUM-VITAMIN D 600-400 MG-UNIT PO TABS: 1.0000 | ORAL_TABLET | Freq: Two times a day (BID) | ORAL | Status: DC

## 2014-09-22 NOTE — Assessment & Plan Note (Signed)
Telephone Contact Note  I called patient and discussed the results of her DXA scan done 08/16/2014 which showed osteopenia of right femur neck (T-score -2.3) and osteoporosis of left forearm radius (T-score -2.9) (lumbar spine was not utilized due to advanced degenerative changes.)  I advised that she start taking calcium 600 mg-vitamin D 400 units twice a day; I also discussed bisphosphonate therapy, but I would like for her to discuss the risks/benefits of a bisphosphonate in person with Dr. Algis Liming at her upcoming clinic visit scheduled for 10/03/2014.  She will also need her vitamin D level checked then.

## 2014-09-22 NOTE — Telephone Encounter (Signed)
I called patient and discussed the results of her DXA scan done 08/16/2014 which showed osteopenia of right femur neck (T-score -2.3) and osteoporosis of left forearm radius (T-score -2.9) (lumbar spine was not utilized due to advanced degenerative changes.)  I advised that she start taking calcium 600 mg-vitamin D 400 units twice a day; I also discussed bisphosphonate therapy, but I would like for her to discuss the risks/benefits of a bisphosphonate in person with Dr. Algis Liming at her upcoming clinic visit scheduled for 10/03/2014.  She will also need her vitamin D level checked then.  Patient reports that she previously called and made the clinic appointment for 10/03/2014 because of hip pain which was limiting her ability to climb stairs; she asked about referral to a rheumatologist.  Her hip pain has improved but has not completely resolved.  She denies any injury or trauma to her hip, and denies any fever, chills, or other constitutional symptoms.  I advised her that it would be best for this to be evaluated initially in clinic and that hip x-rays would be likely be needed prior to a rheumatology referral; I offered to have the clinic move her appointment up to an earlier date, but she declined and would prefer to keep the 10/03/2014 appointment rather than coming in earlier.  I advised her that if her symptoms worsen, or she has any new symptoms, she should come in right away.

## 2014-10-03 ENCOUNTER — Encounter: Payer: Self-pay | Admitting: Internal Medicine

## 2014-10-03 ENCOUNTER — Ambulatory Visit (INDEPENDENT_AMBULATORY_CARE_PROVIDER_SITE_OTHER): Payer: Commercial Managed Care - HMO | Admitting: Internal Medicine

## 2014-10-03 VITALS — BP 132/67 | HR 87 | Temp 98.2°F | Resp 20 | Ht 61.5 in | Wt 206.3 lb

## 2014-10-03 DIAGNOSIS — Z91048 Other nonmedicinal substance allergy status: Secondary | ICD-10-CM

## 2014-10-03 DIAGNOSIS — Z9109 Other allergy status, other than to drugs and biological substances: Secondary | ICD-10-CM | POA: Insufficient documentation

## 2014-10-03 NOTE — Assessment & Plan Note (Signed)
Patient symptoms and physical exam findings inconsistent with seasonal allergies. The patient is currently on no treatment. The patient has been previous surgery with Zyrtec when she had similar symptoms and improved. The patient shows no systemic signs of infection and this is less likely bronchitis. The patient has minimal to no wheezes on exam. Therefore no need for chest x-ray today. Over-the-counter Zyrtec and over-the-counter Flonase or Rhinocort nasal spray Reevaluated the clinic if symptoms progress or get worse

## 2014-10-03 NOTE — Progress Notes (Signed)
Subjective:   Patient ID: Madison Craig female   DOB: 09-04-1945 69 y.o.   MRN: [id number]  HPI: Madison Craig is a 69 y.o. woman with past medical history as listed below who presents for acute visit regarding nasal congestion.  Patient states this has started over the past couple of days and consisted of some itchy eyes, PND, irritated throat causing a cough and difficulty breathing at night given some congestion. She has denied any fever or chills, sore throat, skin rashes, headaches,wheezing, significant shortness of breath, or chest pain. She has not tried any over-the-counter medications to relieve her symptoms. She was told at one point that she have seasonal allergies and was on Zyrtec that provided improvement in her symptoms but has not since used any of these medications. She is a nonsmoker. She works in a daycare.   Past Medical History  Diagnosis Date  . Anemia   . Arthritis   . Obstructive lung disease     Minimal  . GERD (gastroesophageal reflux disease)   . Multiple thyroid nodules     Thyroid ultrasound on 01/25/2014 showed a multinodular goiter, with a 1.5 cm nodule in the inferior left thyroid lobe that met the criteria for ultrasound-guided biopsy.  On 02/06/2014 an ultrasound guided needle aspirate biopsy was performed of the left inferior thyroid nodule; cytopathology findings were consistent with non-neoplastic goiter.     . Macular degeneration 01/11/2014    Followed at [facility] in [place], [place].   Marland Kitchen SLEEP APNEA 03/22/2009    Had sleep study in [place] about 10 years, has BiPAP, cannot sleep with it on.    Marland Kitchen Dyspnea 09/28/2013    Pulmonary function testing done on 02/09/2014 showed minimal airway obstruction with a lack of response to bronchodilators; her FEV1 was normal, the FEV1/FVC ratio and the FEF 25-75% were reduced; the airway resistance was normal; lung volumes were within normal limits; the diffusing capacity was high for the  measured volumes.    . Osteoporosis 09/18/2014    DXA 08/16/2014 showed osteopenia of right femur neck (T-score -2.3) and osteoporosis of left forearm radius (T-score -2.9).  Lumbar spine was not utilized due to advanced degenerative changes.    Current Outpatient Prescriptions  Medication Sig Dispense Refill  . albuterol (PROVENTIL HFA;VENTOLIN HFA) 108 (90 BASE) MCG/ACT inhaler Inhale 2 puffs into the lungs every 6 (six) hours as needed for wheezing or shortness of breath. (Patient not taking: Reported on 07/05/2014) 1 Inhaler 2  . Calcium Carb-Cholecalciferol (CALCIUM-VITAMIN D) 600-400 MG-UNIT TABS Take 1 tablet by mouth 2 (two) times daily. 60 tablet 3  . diclofenac (VOLTAREN) 75 MG EC tablet Take 1 tablet (75 mg total) by mouth daily as needed for moderate pain. 90 tablet 0  . ferrous sulfate 325 (65 FE) MG tablet Take 1 tablet (325 mg total) by mouth 2 (two) times daily with a meal. 60 tablet 2  . ipratropium-albuterol (DUONEB) 0.5-2.5 (3) MG/3ML SOLN Take 3 ml by nebulization as needed for shortness of breath. 360 mL 3  . omeprazole (PRILOSEC) 40 MG capsule Take 40 mg by mouth daily.    Marland Kitchen tiotropium (SPIRIVA HANDIHALER) 18 MCG inhalation capsule Place 1 capsule (18 mcg total) into inhaler and inhale daily. 30 capsule 5   No current facility-administered medications for this visit.   Family History  Problem Relation Age of Onset  . Heart disease Father 25  . Diabetes Son 74  . Stroke Mother   . Stroke Sister  31  . Heart attack Mother 48  . Colon cancer Neg Hx   . Breast cancer Neg Hx    History   Social History  . Marital Status: Widowed    Spouse Name: N/A  . Number of Children: 3  . Years of Education: N/A   Social History Main Topics  . Smoking status: Former Smoker -- 2.00 packs/day for 16 years    Types: Cigarettes    Quit date: 09/29/1978  . Smokeless tobacco: Never Used  . Alcohol Use: No  . Drug Use: No  . Sexual Activity: Not on file   Other Topics Concern  .  None   Social History Narrative   Review of Systems: Pertinent items are noted in HPI. Objective:  Physical Exam: Filed Vitals:   10/03/14 1004 10/03/14 1005  BP: 119/51 132/67  Pulse: 77 87  Temp: 98.2 F (36.8 C)   TempSrc: Oral   Resp: 20   Height: 5' 1.5" (1.562 m)   Weight: 206 lb 4.8 oz (93.577 kg)   SpO2: 97% 97%   General: sitting in chair, NAD HEENT: PERRL, EOMI, no scleral icterus, watery eyes with minimal conjunctival injection, clear PND with some erythema/cobblestoning of oropharynx Cardiac: RRR, no rubs, murmurs or gallops Pulm: clear to auscultation bilaterally, only slight minimal expiratory wheeze, moving normal volumes of air Abd: soft, nontender, nondistended, BS present Ext: warm and well perfused, no pedal edema Neuro: alert and oriented X3, cranial nerves II-XII grossly intact  Assessment & Plan:  Please see problem oriented charting  Pt discussed with Dr. Dareen Piano

## 2014-10-03 NOTE — Patient Instructions (Addendum)
General Instructions:   Please bring your medicines with you each time you come to clinic.  Medicines may include prescription medications, over-the-counter medications, herbal remedies, eye drops, vitamins, or other pills.   For your allergies:  1. Try Zyrtec $RemoveBe'10mg'ZrJgAkZAy$  2. Try flonase or rhinocort which is an over the counter nasal spray and can start with the 2 puffs a day until it gets more clear   Progress Toward Treatment Goals:  No flowsheet data found.  Self Care Goals & Plans:  No flowsheet data found.  No flowsheet data found.   Care Management & Community Referrals:  Referral 07/05/2014  Referrals made to community resources none       Allergies Allergies may happen from anything your body is sensitive to. This may be food, medicines, pollens, chemicals, and nearly anything around you in everyday life that produces allergens. An allergen is anything that causes an allergy producing substance. Heredity is often a factor in causing these problems. This means you may have some of the same allergies as your parents. Food allergies happen in all age groups. Food allergies are some of the most severe and life threatening. Some common food allergies are cow's milk, seafood, eggs, nuts, wheat, and soybeans. SYMPTOMS   Swelling around the mouth.  An itchy red rash or hives.  Vomiting or diarrhea.  Difficulty breathing. SEVERE ALLERGIC REACTIONS ARE LIFE-THREATENING. This reaction is called anaphylaxis. It can cause the mouth and throat to swell and cause difficulty with breathing and swallowing. In severe reactions only a trace amount of food (for example, peanut oil in a salad) may cause death within seconds. Seasonal allergies occur in all age groups. These are seasonal because they usually occur during the same season every year. They may be a reaction to molds, grass pollens, or tree pollens. Other causes of problems are house dust mite allergens, pet dander, and mold spores.  The symptoms often consist of nasal congestion, a runny itchy nose associated with sneezing, and tearing itchy eyes. There is often an associated itching of the mouth and ears. The problems happen when you come in contact with pollens and other allergens. Allergens are the particles in the air that the body reacts to with an allergic reaction. This causes you to release allergic antibodies. Through a chain of events, these eventually cause you to release histamine into the blood stream. Although it is meant to be protective to the body, it is this release that causes your discomfort. This is why you were given anti-histamines to feel better. If you are unable to pinpoint the offending allergen, it may be determined by skin or blood testing. Allergies cannot be cured but can be controlled with medicine. Hay fever is a collection of all or some of the seasonal allergy problems. It may often be treated with simple over-the-counter medicine such as diphenhydramine. Take medicine as directed. Do not drink alcohol or drive while taking this medicine. Check with your caregiver or package insert for child dosages. If these medicines are not effective, there are many new medicines your caregiver can prescribe. Stronger medicine such as nasal spray, eye drops, and corticosteroids may be used if the first things you try do not work well. Other treatments such as immunotherapy or desensitizing injections can be used if all else fails. Follow up with your caregiver if problems continue. These seasonal allergies are usually not life threatening. They are generally more of a nuisance that can often be handled using medicine. HOME CARE INSTRUCTIONS  If unsure what causes a reaction, keep a diary of foods eaten and symptoms that follow. Avoid foods that cause reactions.  If hives or rash are present:  Take medicine as directed.  You may use an over-the-counter antihistamine (diphenhydramine) for hives and itching as  needed.  Apply cold compresses (cloths) to the skin or take baths in cool water. Avoid hot baths or showers. Heat will make a rash and itching worse.  If you are severely allergic:  Following a treatment for a severe reaction, hospitalization is often required for closer follow-up.  Wear a medic-alert bracelet or necklace stating the allergy.  You and your family must learn how to give adrenaline or use an anaphylaxis kit.  If you have had a severe reaction, always carry your anaphylaxis kit or EpiPen with you. Use this medicine as directed by your caregiver if a severe reaction is occurring. Failure to do so could have a fatal outcome. SEEK MEDICAL CARE IF:  You suspect a food allergy. Symptoms generally happen within 30 minutes of eating a food.  Your symptoms have not gone away within 2 days or are getting worse.  You develop new symptoms.  You want to retest yourself or your child with a food or drink you think causes an allergic reaction. Never do this if an anaphylactic reaction to that food or drink has happened before. Only do this under the care of a caregiver. SEEK IMMEDIATE MEDICAL CARE IF:   You have difficulty breathing, are wheezing, or have a tight feeling in your chest or throat.  You have a swollen mouth, or you have hives, swelling, or itching all over your body.  You have had a severe reaction that has responded to your anaphylaxis kit or an EpiPen. These reactions may return when the medicine has worn off. These reactions should be considered life threatening. MAKE SURE YOU:   Understand these instructions.  Will watch your condition.  Will get help right away if you are not doing well or get worse. Document Released: 08/05/2002 Document Revised: 09/06/2012 Document Reviewed: 01/10/2008 [facility] Patient Information 2015 [place], [place]. This information is not intended to replace advice given to you by your health care provider. Make sure you discuss any  questions you have with your health care provider.

## 2014-10-03 NOTE — Progress Notes (Signed)
INTERNAL MEDICINE TEACHING ATTENDING ADDENDUM - Weldon Nouri, MD: I reviewed and discussed at the time of visit with the resident Dr. Krall, the patient's medical history, physical examination, diagnosis and results of pertinent tests and treatment and I agree with the patient's care as documented.  

## 2014-10-26 ENCOUNTER — Other Ambulatory Visit: Payer: Self-pay | Admitting: Internal Medicine

## 2014-10-27 NOTE — Telephone Encounter (Signed)
Saw PCP Feb. Needs PCP appt Aug / Sep  [facility] Feb.

## 2014-12-15 ENCOUNTER — Emergency Department (HOSPITAL_COMMUNITY): Payer: Commercial Managed Care - HMO

## 2014-12-15 ENCOUNTER — Encounter (HOSPITAL_COMMUNITY): Payer: Self-pay

## 2014-12-15 ENCOUNTER — Emergency Department (HOSPITAL_COMMUNITY)
Admission: EM | Admit: 2014-12-15 | Discharge: 2014-12-15 | Disposition: A | Discharge status: Home / Self Care | Payer: Commercial Managed Care - HMO | Attending: Emergency Medicine | Admitting: Emergency Medicine

## 2014-12-15 DIAGNOSIS — Z8669 Personal history of other diseases of the nervous system and sense organs: Secondary | ICD-10-CM | POA: Diagnosis not present

## 2014-12-15 DIAGNOSIS — Z8639 Personal history of other endocrine, nutritional and metabolic disease: Secondary | ICD-10-CM | POA: Insufficient documentation

## 2014-12-15 DIAGNOSIS — Z87891 Personal history of nicotine dependence: Secondary | ICD-10-CM | POA: Diagnosis not present

## 2014-12-15 DIAGNOSIS — K219 Gastro-esophageal reflux disease without esophagitis: Secondary | ICD-10-CM | POA: Insufficient documentation

## 2014-12-15 DIAGNOSIS — Z8739 Personal history of other diseases of the musculoskeletal system and connective tissue: Secondary | ICD-10-CM | POA: Insufficient documentation

## 2014-12-15 DIAGNOSIS — M810 Age-related osteoporosis without current pathological fracture: Secondary | ICD-10-CM | POA: Diagnosis not present

## 2014-12-15 DIAGNOSIS — R0602 Shortness of breath: Secondary | ICD-10-CM | POA: Diagnosis not present

## 2014-12-15 DIAGNOSIS — Z79899 Other long term (current) drug therapy: Secondary | ICD-10-CM | POA: Insufficient documentation

## 2014-12-15 DIAGNOSIS — R0789 Other chest pain: Secondary | ICD-10-CM | POA: Insufficient documentation

## 2014-12-15 DIAGNOSIS — S299XXA Unspecified injury of thorax, initial encounter: Secondary | ICD-10-CM | POA: Diagnosis not present

## 2014-12-15 DIAGNOSIS — D649 Anemia, unspecified: Secondary | ICD-10-CM | POA: Diagnosis not present

## 2014-12-15 DIAGNOSIS — R05 Cough: Secondary | ICD-10-CM | POA: Insufficient documentation

## 2014-12-15 DIAGNOSIS — R52 Pain, unspecified: Secondary | ICD-10-CM

## 2014-12-15 DIAGNOSIS — R059 Cough, unspecified: Secondary | ICD-10-CM

## 2014-12-15 MED ORDER — PREDNISONE 10 MG PO TABS: ORAL_TABLET | ORAL | Status: DC

## 2014-12-15 MED ORDER — AZITHROMYCIN 250 MG PO TABS
500.0000 mg | ORAL_TABLET | Freq: Once | ORAL | Status: AC
  Administered 2014-12-15: 500 mg via ORAL
  Filled 2014-12-15: qty 2

## 2014-12-15 MED ORDER — AZITHROMYCIN 250 MG PO TABS: 250.0000 mg | ORAL_TABLET | Freq: Every day | ORAL | Status: DC

## 2014-12-15 MED ORDER — HYDROCOD POLST-CPM POLST ER 10-8 MG/5ML PO SUER: 5.0000 mL | Freq: Every evening | ORAL | Status: DC | PRN

## 2014-12-15 NOTE — ED Provider Notes (Signed)
CSN: [id number]     Arrival date & time 12/15/14  1912 History   First MD Initiated Contact with Patient 12/15/14 2006     Chief Complaint  Patient presents with  . Cough     (Consider location/radiation/quality/duration/timing/severity/associated sxs/prior Treatment) Patient is a 69 y.o. female presenting with cough. The history is provided by the patient. No language interpreter was used.  Cough Cough characteristics:  Productive Sputum characteristics:  Green Duration:  12 weeks Progression:  Worsening Smoker: no   Worsened by:  Lying down and activity Associated symptoms: chest pain and shortness of breath   Associated symptoms: no chills, no fever, no myalgias and no sore throat   Associated symptoms comment:  Cough for the past 3 months, productive, without fever. She has a nebulizer at home that offers no relief. Non-smoker. Onset chest pain on right yesterday, "I'm concerned about a fractured rib." Symptoms initially included head congestion but no current symptoms of sinus or nasal pressure/congestion, sore throat.   Past Medical History  Diagnosis Date  . Anemia   . Arthritis   . Obstructive lung disease     Minimal  . GERD (gastroesophageal reflux disease)   . Multiple thyroid nodules     Thyroid ultrasound on 01/25/2014 showed a multinodular goiter, with a 1.5 cm nodule in the inferior left thyroid lobe that met the criteria for ultrasound-guided biopsy.  On 02/06/2014 an ultrasound guided needle aspirate biopsy was performed of the left inferior thyroid nodule; cytopathology findings were consistent with non-neoplastic goiter.     . Macular degeneration 01/11/2014    Followed at [facility] in [place], [place].   Marland Kitchen SLEEP APNEA 03/22/2009    Had sleep study in [place] about 10 years, has BiPAP, cannot sleep with it on.    Marland Kitchen Dyspnea 09/28/2013    Pulmonary function testing done on 02/09/2014 showed minimal airway obstruction with a lack of response to bronchodilators;  her FEV1 was normal, the FEV1/FVC ratio and the FEF 25-75% were reduced; the airway resistance was normal; lung volumes were within normal limits; the diffusing capacity was high for the measured volumes.    . Osteoporosis 09/18/2014    DXA 08/16/2014 showed osteopenia of right femur neck (T-score -2.3) and osteoporosis of left forearm radius (T-score -2.9).  Lumbar spine was not utilized due to advanced degenerative changes.    History reviewed. No pertinent past surgical history. Family History  Problem Relation Age of Onset  . Heart disease Father 62  . Diabetes Son 36  . Stroke Mother   . Stroke Sister 93  . Heart attack Mother 76  . Colon cancer Neg Hx   . Breast cancer Neg Hx    History  Substance Use Topics  . Smoking status: Former Smoker -- 2.00 packs/day for 16 years    Types: Cigarettes    Quit date: 09/29/1978  . Smokeless tobacco: Never Used  . Alcohol Use: No   OB History    No data available     Review of Systems  Constitutional: Negative for fever and chills.  HENT: Negative.  Negative for congestion and sore throat.   Respiratory: Positive for cough, chest tightness and shortness of breath.   Cardiovascular: Positive for chest pain.  Gastrointestinal: Negative.  Negative for nausea and vomiting.  Musculoskeletal: Negative.  Negative for myalgias.  Skin: Negative.   Neurological: Negative.       Allergies  Review of patient's allergies indicates no known allergies.  Home Medications  Prior to Admission medications   Medication Sig Start Date End Date Taking? Authorizing Provider  albuterol (PROVENTIL HFA;VENTOLIN HFA) 108 (90 BASE) MCG/ACT inhaler Inhale 2 puffs into the lungs every 6 (six) hours as needed for wheezing or shortness of breath. Patient not taking: Reported on 07/05/2014 09/28/13   Malena Catholic, MD  Calcium Carb-Cholecalciferol (CALCIUM-VITAMIN D) 600-400 MG-UNIT TABS Take 1 tablet by mouth 2 (two) times daily. 09/22/14   Bertha Stakes, MD  diclofenac (VOLTAREN) 75 MG EC tablet TAKE ONE TABLET BY MOUTH ONCE DAILY AS NEEDED FOR MODERATE PAIN 10/27/14   Bartholomew Crews, MD  ferrous sulfate 325 (65 FE) MG tablet Take 1 tablet (325 mg total) by mouth 2 (two) times daily with a meal. 04/05/14 04/05/15  Bertha Stakes, MD  ipratropium-albuterol (DUONEB) 0.5-2.5 (3) MG/3ML SOLN Take 3 ml by nebulization as needed for shortness of breath. 09/28/13   Malena Catholic, MD  omeprazole (PRILOSEC) 40 MG capsule Take 40 mg by mouth daily.    Historical Provider, MD  tiotropium (SPIRIVA HANDIHALER) 18 MCG inhalation capsule Place 1 capsule (18 mcg total) into inhaler and inhale daily. 07/05/14 07/05/15  Bertha Stakes, MD   BP 132/71 mmHg  Pulse 83  Temp(Src) 97.8 F (36.6 C) (Oral)  Resp 20  Ht 5' 2"  (1.575 m)  Wt 205 lb (92.987 kg)  BMI 37.49 kg/m2  SpO2 95% Physical Exam  Constitutional: She is oriented to person, place, and time. She appears well-developed and well-nourished.  HENT:  Head: Normocephalic.  Mouth/Throat: Oropharynx is clear and moist.  Eyes: Conjunctivae are normal.  Neck: Normal range of motion. Neck supple.  Cardiovascular: Normal rate and regular rhythm.   Pulmonary/Chest: Effort normal and breath sounds normal. She has no wheezes. She has no rales. She exhibits tenderness.  Abdominal: Soft. Bowel sounds are normal. There is no tenderness. There is no rebound and no guarding.  Musculoskeletal: Normal range of motion. She exhibits no edema.  Lymphadenopathy:    She has no cervical adenopathy.  Neurological: She is alert and oriented to person, place, and time.  Skin: Skin is warm and dry. No rash noted.  Psychiatric: She has a normal mood and affect.    ED Course  Procedures (including critical care time) Labs Review Labs Reviewed - No data to display  Imaging Review Dg Chest 2 View  12/15/2014   CLINICAL DATA:  Productive cough for 2 months  EXAM: CHEST  2 VIEW  COMPARISON:  July 05, 2014   FINDINGS: There is no edema or consolidation. There is central peribronchial thickening, stable. Heart size and pulmonary vascularity are normal. No adenopathy. There is degenerative change in thoracic spine.  IMPRESSION: Evidence a degree of chronic bronchitis, stable. No edema or consolidation.   Electronically Signed   By: Lowella Grip III M.D.   On: 12/15/2014 20:02     EKG Interpretation None      MDM   Final diagnoses:  None   1. Cough  The patient is non-toxic appearing. No respiratory distress. Cough for 3 months that persists. Will treat with abx and prednisone. Tussionex for night cough relief. Encouraged continuation of Zyrtec and nebulizer and PCP recheck in 1 week.    Charlann Lange, PA-C 12/16/14 [id number]  Alfonzo Beers, MD 12/17/14 [phone]

## 2014-12-15 NOTE — Discharge Instructions (Signed)
Cough, Adult  A cough is a reflex that helps clear your throat and airways. It can help heal the body or may be a reaction to an irritated airway. A cough may only last 2 or 3 weeks (acute) or may last more than 8 weeks (chronic).  CAUSES Acute cough:  Viral or bacterial infections. Chronic cough:  Infections.  Allergies.  Asthma.  Post-nasal drip.  Smoking.  Heartburn or acid reflux.  Some medicines.  Chronic lung problems (COPD).  Cancer. SYMPTOMS   Cough.  Fever.  Chest pain.  Increased breathing rate.  High-pitched whistling sound when breathing (wheezing).  Colored mucus that you cough up (sputum). TREATMENT   A bacterial cough may be treated with antibiotic medicine.  A viral cough must run its course and will not respond to antibiotics.  Your caregiver may recommend other treatments if you have a chronic cough. HOME CARE INSTRUCTIONS   Only take over-the-counter or prescription medicines for pain, discomfort, or fever as directed by your caregiver. Use cough suppressants only as directed by your caregiver.  Use a cold steam vaporizer or humidifier in your bedroom or home to help loosen secretions.  Sleep in a semi-upright position if your cough is worse at night.  Rest as needed.  Stop smoking if you smoke. SEEK IMMEDIATE MEDICAL CARE IF:   You have pus in your sputum.  Your cough starts to worsen.  You cannot control your cough with suppressants and are losing sleep.  You begin coughing up blood.  You have difficulty breathing.  You develop pain which is getting worse or is uncontrolled with medicine.  You have a fever. MAKE SURE YOU:   Understand these instructions.  Will watch your condition.  Will get help right away if you are not doing well or get worse. Document Released: 11/08/2010 Document Revised: 08/04/2011 Document Reviewed: 11/08/2010 Exit[employer] Patient Information 2015 [employer]. This information is not intended  to replace advice given to you by your health care provider. Make sure you discuss any questions you have with your health care provider.  

## 2014-12-15 NOTE — ED Notes (Signed)
Patient reports she has had chest congestion since May.  Her PCP diagnosed her with seasonal allergies.  She states that Zyrtec and PRN Albuterol treatments are not helping.  She says when she had the same symptoms in the past she was successfully treated with ZPack and prednisone.

## 2014-12-15 NOTE — ED Notes (Signed)
Patient is alert and orientated.  She verbalize discharge instructions.

## 2015-02-02 ENCOUNTER — Telehealth: Payer: Self-pay | Admitting: Student in an Organized Health Care Education/Training Program

## 2015-02-02 NOTE — Telephone Encounter (Signed)
Call to patient to confirm appointment for 02/05/15 at 8:15 lmtcb

## 2015-02-05 ENCOUNTER — Ambulatory Visit (HOSPITAL_COMMUNITY)
Admission: RE | Admit: 2015-02-05 | Discharge: 2015-02-05 | Disposition: A | Discharge status: Home / Self Care | Payer: Commercial Managed Care - HMO | Source: Ambulatory Visit | Attending: Student in an Organized Health Care Education/Training Program | Admitting: Student in an Organized Health Care Education/Training Program

## 2015-02-05 ENCOUNTER — Encounter: Payer: Self-pay | Admitting: Student in an Organized Health Care Education/Training Program

## 2015-02-05 ENCOUNTER — Ambulatory Visit (INDEPENDENT_AMBULATORY_CARE_PROVIDER_SITE_OTHER): Payer: Commercial Managed Care - HMO | Admitting: Student in an Organized Health Care Education/Training Program

## 2015-02-05 ENCOUNTER — Other Ambulatory Visit: Payer: Self-pay | Admitting: Student in an Organized Health Care Education/Training Program

## 2015-02-05 VITALS — BP 151/70 | HR 63 | Temp 98.0°F | Wt 207.0 lb

## 2015-02-05 DIAGNOSIS — Z0000 Encounter for general adult medical examination without abnormal findings: Secondary | ICD-10-CM

## 2015-02-05 DIAGNOSIS — J4520 Mild intermittent asthma, uncomplicated: Secondary | ICD-10-CM

## 2015-02-05 DIAGNOSIS — M810 Age-related osteoporosis without current pathological fracture: Secondary | ICD-10-CM | POA: Diagnosis not present

## 2015-02-05 DIAGNOSIS — M25561 Pain in right knee: Secondary | ICD-10-CM | POA: Diagnosis not present

## 2015-02-05 DIAGNOSIS — J989 Respiratory disorder, unspecified: Secondary | ICD-10-CM | POA: Diagnosis not present

## 2015-02-05 DIAGNOSIS — M1990 Unspecified osteoarthritis, unspecified site: Secondary | ICD-10-CM | POA: Diagnosis not present

## 2015-02-05 DIAGNOSIS — Z23 Encounter for immunization: Secondary | ICD-10-CM

## 2015-02-05 DIAGNOSIS — D500 Iron deficiency anemia secondary to blood loss (chronic): Secondary | ICD-10-CM | POA: Diagnosis not present

## 2015-02-05 DIAGNOSIS — M170 Bilateral primary osteoarthritis of knee: Secondary | ICD-10-CM | POA: Diagnosis not present

## 2015-02-05 DIAGNOSIS — D509 Iron deficiency anemia, unspecified: Secondary | ICD-10-CM

## 2015-02-05 DIAGNOSIS — M25562 Pain in left knee: Secondary | ICD-10-CM

## 2015-02-05 DIAGNOSIS — M8580 Other specified disorders of bone density and structure, unspecified site: Secondary | ICD-10-CM

## 2015-02-05 MED ORDER — ALENDRONATE SODIUM 70 MG PO TABS: 70.0000 mg | ORAL_TABLET | ORAL | Status: DC

## 2015-02-05 MED ORDER — DICLOFENAC SODIUM 75 MG PO TBEC: DELAYED_RELEASE_TABLET | ORAL | Status: DC

## 2015-02-05 NOTE — Assessment & Plan Note (Signed)
Iron deficiency anemia, could be exacerbating symptoms of dyspnea on exertion. Last hemoglobin was low normal at 12.8, ferritin has been as low as 8 in 2015. Source of blood loss is unclear. She is post-menopausal, last colonoscopy in 2014 at outside facility. Repeat CBC and ferritin today. If ferritin is still low, we'll need to get the records about her prior GI imaging and workup source of blood loss.

## 2015-02-05 NOTE — Assessment & Plan Note (Signed)
DEXA scan in 2016 with right femur T score -2.3. She had one fracture about 10 years ago of her left humerus after a fall from standing. Based on this I think the patient has osteoporosis. We talked about the risks and benefits of fracture primary prevention with bisphosphonate therapy. We will initiate alendronate 70 mg once weekly, I advised reflux precautions. Plan for at least 5 years of treatment.

## 2015-02-05 NOTE — Assessment & Plan Note (Signed)
Symptoms of pain in her knees seems very consistent with osteoarthritis. She has a prior history of joint effusion, and some sensation of morning stiffness, so we will evaluate for rheumatoid arthritis. We'll get x-rays of bilateral knees today, which I anticipate will show degenerative changes. Checking CRP, rheumatoid factor, and anti-CCP. Plan for conservative treatment with diclofenac 75 mg daily along with omeprazole 40 mg daily for gastritis prophylaxis. Also advised to use Tylenol as needed for bad days.

## 2015-02-05 NOTE — Assessment & Plan Note (Signed)
Subjective sensation of dyspnea on exertion is stable, and currently not preventing her daily activities. Pulmonary function testing completed in 2015 and showed very mild small airway obstruction that was somewhat reversible (FEV1 improved by 10%). She doesn't meet the threshold for COPD or asthma diagnosis at this point. Because she gets so many flares of symptoms that resolve with prednisone and azithromycin, I think she has a predisposition to reactive airway disease especially after viral URIs. For now we will treat her conservatively, discontinue tiotropium and DuoNeb's. Advised her to use albuterol as needed and to call me if she starts to use it more frequently. We'll treat her exacerbations with either inhaled corticosteroids for mild symptoms or systemic steroids for more severe.

## 2015-02-05 NOTE — Progress Notes (Signed)
   See Encounters tab for problem-based medical decision making  __________________________________________________________  HPI:  69 year old woman comes in today for evaluation of worsening pain in both of her knees. Patient reports a long history of waxing and waning pain in both knees and along both greater trochanters. She reports that about 20 years ago she was told that she might have rheumatoid arthritis by a physician because she was having some swelling around her knees. No further diagnostic workup done since that time. Symptoms have worsened over the last few weeks. She says that she mostly has pain and stiffness at the end of the day, especially when she gets into her car after work. Denies any recent trauma. Reports mild morning stiffness in her knees and hands which resolves after only a few minutes. Denies any pain in her shoulders or elbows. No recent rashes or skin nodules. No fevers or chills. Denies any swelling of her joints. No changes in her skin or erythema. For this pain she takes diclofenac once daily and reports some good benefit. Also uses Tylenol as needed. 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 Denies any falls this year, but she does feel unsteady on her feet, she is very careful to go up and down stairs.  Patient also has a history of a normocytic anemia. She was prescribed ferrous sulfate twice a day but did not start this medication. She's been about 5 years postmenopausal. Does have a history of colonic polyps, last colonoscopy 2014. She has had a recent workup for dyspnea on exertion. Currently she says she feels the same. She is able to get through her day of work, walk up a couple flight of stairs, and does walk 2 the end of her driveway without difficulty. She has a little bit of shortness of breath if she does much more than this. Denies any chest pain. No cough or productive sputum.  She says she had one upper respiratory infection this spring which had to be treated with prednisone and azithromycin. This cleared up her symptoms very well. She self discontinued the DuoNeb and Spiriva she didn't find much benefit from them. Currently not using albuterol at all.  __________________________________________________________  Problem List: Patient Active Problem List   Diagnosis Date Noted  . Osteoporosis 09/18/2014  . Health care maintenance 04/05/2014  . Anemia 01/11/2014  . Macular degeneration 01/11/2014  . Hearing loss of both ears 01/11/2014  . Obesity 10/03/2013  . Dyspnea 09/28/2013  . Routine health maintenance 09/28/2013  . GERD (gastroesophageal reflux disease) 09/28/2013  . HIATAL HERNIA WITH REFLUX 03/22/2009  . Osteoarthritis 03/22/2009  . SLEEP APNEA 03/22/2009  . Thyroid nodule 03/22/2009    Medications: Reconciled today in Epic __________________________________________________________  Physical Exam:  Vital Signs: Filed Vitals:   02/05/15 0824  BP: 151/70  Pulse: 63  Temp: 98 F (36.7 C)  TempSrc: Oral  Weight: 207 lb (93.895 kg)  SpO2: 99%    Gen: Well appearing, NAD ENT: OP clear without erythema or exudate. Upper dentures, lower edentulous. Neck: No cervical LAD, No thyromegaly or nodules, No JVD. CV: RRR, no murmurs Pulm: Normal effort, CTA throughout, no wheezing Abd: Soft, NT, ND. Ext: Warm, trace non-pitting edema, bilateral LE varicose veins around the ankles, right knee with moderate crepitus, no effusion. Skin: No atypical appearing moles. No rashes

## 2015-02-05 NOTE — Patient Instructions (Signed)
1. For your knee pain, keep taking Voltaren daily, you can also use Tylenol as needed if the pain is worse.   2. Start Fosamax for osteoperosis (thinning of the bones) which will reduce your risk of fractures.   3. I will call you with your blood work results.   4. Let me know if you have any further upper respiratory infections in the future

## 2015-02-06 LAB — CBC
Hematocrit: 38.6 % (ref 34.0–46.6)
Hemoglobin: 12.5 g/dL (ref 11.1–15.9)
MCH: 29.3 pg (ref 26.6–33.0)
MCHC: 32.4 g/dL (ref 31.5–35.7)
MCV: 90 fL (ref 79–97)
Platelets: 234 10*3/uL (ref 150–379)
RBC: 4.27 x10E6/uL (ref 3.77–5.28)
RDW: 14.6 % (ref 12.3–15.4)
WBC: 5 10*3/uL (ref 3.4–10.8)

## 2015-02-06 LAB — FERRITIN: FERRITIN: 17 ng/mL (ref 15–150)

## 2015-02-06 LAB — RHEUMATOID FACTOR: RHEUMATOID FACTOR: 14.4 [IU]/mL — AB (ref 0.0–13.9)

## 2015-02-06 LAB — C-REACTIVE PROTEIN: CRP: 3.2 mg/L (ref 0.0–4.9)

## 2015-02-06 LAB — HEPATITIS C ANTIBODY: HEP C VIRUS AB: 0.1 {s_co_ratio} (ref 0.0–0.9)

## 2015-02-07 LAB — CYCLIC CITRUL PEPTIDE ANTIBODY, IGG/IGA: CYCLIC CITRULLIN PEPTIDE AB: 16 U (ref 0–19)

## 2015-02-08 ENCOUNTER — Encounter: Payer: Self-pay | Admitting: Student in an Organized Health Care Education/Training Program

## 2015-07-13 ENCOUNTER — Encounter: Payer: Self-pay | Admitting: Internal Medicine

## 2015-07-13 ENCOUNTER — Ambulatory Visit (INDEPENDENT_AMBULATORY_CARE_PROVIDER_SITE_OTHER): Payer: Commercial Managed Care - HMO | Admitting: Internal Medicine

## 2015-07-13 VITALS — BP 142/71 | HR 83 | Temp 98.0°F | Wt 206.2 lb

## 2015-07-13 DIAGNOSIS — Z0000 Encounter for general adult medical examination without abnormal findings: Secondary | ICD-10-CM

## 2015-07-13 DIAGNOSIS — J069 Acute upper respiratory infection, unspecified: Secondary | ICD-10-CM

## 2015-07-13 DIAGNOSIS — J020 Streptococcal pharyngitis: Secondary | ICD-10-CM

## 2015-07-13 LAB — RAPID STREP SCREEN (MED CTR MEBANE ONLY): Streptococcus, Group A Screen (Direct): NEGATIVE

## 2015-07-13 NOTE — Patient Instructions (Signed)
-  Drink plenty of fluids and gargle with salt water  -Take tylenol as needed for fever and pain -Will call you with the results of your rapid strep test -If you do not improve in the next 5 days then come back -Very nice meeting you and hope you feel better soon!  Upper Respiratory Infection, Adult Most upper respiratory infections (URIs) are caused by a virus. A URI affects the nose, throat, and upper air passages. The most common type of URI is often called "the common cold." HOME CARE   Take medicines only as told by your doctor.  Gargle warm saltwater or take cough drops to comfort your throat as told by your doctor.  Use a warm mist humidifier or inhale steam from a shower to increase air moisture. This may make it easier to breathe.  Drink enough fluid to keep your pee (urine) clear or pale yellow.  Eat soups and other clear broths.  Have a healthy diet.  Rest as needed.  Go back to work when your fever is gone or your doctor says it is okay.  You may need to stay home longer to avoid giving your URI to others.  You can also wear a face mask and wash your hands often to prevent spread of the virus.  Use your inhaler more if you have asthma.  Do not use any tobacco products, including cigarettes, chewing tobacco, or electronic cigarettes. If you need help quitting, ask your doctor. GET HELP IF:  You are getting worse, not better.  Your symptoms are not helped by medicine.  You have chills.  You are getting more short of breath.  You have brown or red mucus.  You have yellow or brown discharge from your nose.  You have pain in your face, especially when you bend forward.  You have a fever.  You have puffy (swollen) neck glands.  You have pain while swallowing.  You have white areas in the back of your throat. GET HELP RIGHT AWAY IF:   You have very bad or constant:  Headache.  Ear pain.  Pain in your forehead, behind your eyes, and over your  cheekbones (sinus pain).  Chest pain.  You have long-lasting (chronic) lung disease and any of the following:  Wheezing.  Long-lasting cough.  Coughing up blood.  A change in your usual mucus.  You have a stiff neck.  You have changes in your:  Vision.  Hearing.  Thinking.  Mood. MAKE SURE YOU:   Understand these instructions.  Will watch your condition.  Will get help right away if you are not doing well or get worse.   This information is not intended to replace advice given to you by your health care provider. Make sure you discuss any questions you have with your health care provider.   Document Released: 10/29/2007 Document Revised: 09/26/2014 Document Reviewed: 08/17/2013 Elsevier Interactive Patient Education 2016 [employer] Instructions:   Please bring your medicines with you each time you come to clinic.  Medicines may include prescription medications, over-the-counter medications, herbal remedies, eye drops, vitamins, or other pills.   Progress Toward Treatment Goals:  No flowsheet data found.  Self Care Goals & Plans:  No flowsheet data found.  No flowsheet data found.   Care Management & Community Referrals:  Referral 07/05/2014  Referrals made to community resources none

## 2015-07-13 NOTE — Assessment & Plan Note (Addendum)
Assessment: Pt with acute URI symptoms with recent sick contacts at daycare most likely due to the common cold.  Plan:  -Rapid strep throat performed was negative  -Pt instructed to increase fluid intake, rest, and gargle salt water  -Pt instructed to take OTC tylenol for pain and fever -Pt instructed to return if symptoms do no improve within 5 days   ADDENDUM on 07/16/15: Pt with few GABS on throat culture, will prescribe penicillin V potassium 500 mg BID for 10 days, pt called this morning regarding results.

## 2015-07-13 NOTE — Progress Notes (Signed)
Patient ID: Madison Craig, female   DOB: 1945/12/22, 70 y.o.   MRN: [id number]    Subjective:   Patient ID: Madison Craig female   DOB: 25-Feb-1946 70 y.o.   MRN: [id number]  HPI: [name] is a 70 y.o. pleasant woman with past medical history of RAD, macular degeneration, osteoporosis, multinodular goiter, OA, GERD, and OSA who presents with chief complaint of sore throat.  She reports having sore throat which she describes as feeling "raw" with rhinorrhea, nasal congestion, productive cough, chills, subjective fever, right ear pain, and headache for the past 2 days. She reports her symptoms have improved since they first began. 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 She denies body aches worse from baseline or extreme weakness or fatigue. She denies tonsillar exudates, cervical lymphadenopathy, sinus/facial pain, dyspnea, or wheezing. She had mild watery diarrhea earlier today but denies nausea, vomiting, abdominal pain, or urinary symptoms. She took tylenol without relief. She did receive the flu shot this year.       Past Medical History  Diagnosis Date  . Anemia   . Arthritis   . Obstructive lung disease (HCC)     Minimal  . GERD (gastroesophageal reflux disease)   . Multiple thyroid nodules     Thyroid ultrasound on 01/25/2014 showed a multinodular goiter, with a 1.5 cm nodule in the inferior left thyroid lobe that met the criteria for ultrasound-guided biopsy.  On 02/06/2014 an ultrasound guided needle aspirate biopsy was performed of the left inferior thyroid nodule; cytopathology findings were consistent with non-neoplastic goiter.     . Macular degeneration 01/11/2014    Followed at [facility] in [place], [place].   Marland Kitchen SLEEP APNEA 03/22/2009    Had sleep study in [place] about 10 years, has BiPAP, cannot sleep with it on.    Marland Kitchen Dyspnea 09/28/2013    Pulmonary  function testing done on 02/09/2014 showed minimal airway obstruction with a lack of response to bronchodilators; her FEV1 was normal, the FEV1/FVC ratio and the FEF 25-75% were reduced; the airway resistance was normal; lung volumes were within normal limits; the diffusing capacity was high for the measured volumes.    . Osteoporosis 09/18/2014    DXA 08/16/2014 showed osteopenia of right femur neck (T-score -2.3) and osteoporosis of left forearm radius (T-score -2.9).  Lumbar spine was not utilized due to advanced degenerative changes.    Current Outpatient Prescriptions  Medication Sig Dispense Refill  . albuterol (PROVENTIL HFA;VENTOLIN HFA) 108 (90 BASE) MCG/ACT inhaler Inhale 2 puffs into the lungs every 6 (six) hours as needed for wheezing or shortness of breath. (Patient not taking: Reported on 07/05/2014) 1 Inhaler 2  . alendronate (FOSAMAX) 70 MG tablet Take 1 tablet (70 mg total) by mouth once a week. Take with a full glass of water on an empty stomach. 12 tablet 3  . diclofenac (VOLTAREN) 75 MG EC tablet One tablet daily 90 tablet 2  . omeprazole (PRILOSEC) 40 MG capsule Take 40 mg by mouth daily.     No current facility-administered medications for this visit.   Family History  Problem Relation Age of Onset  . Heart disease Father 39  . Diabetes Son 23  . Stroke Mother   . Stroke Sister 41  . Heart attack Mother 79  . Colon cancer Neg Hx   . Breast cancer Neg Hx    Social History  Social History  . Marital Status: Widowed    Spouse Name: N/A  . Number of Children: 3  . Years of Education: N/A   Social History Main Topics  . Smoking status: Former Smoker -- 2.00 packs/day for 16 years    Types: Cigarettes    Quit date: 09/29/1978  . Smokeless tobacco: Never Used  . Alcohol Use: No  . Drug Use: No  . Sexual Activity: Not Asked   Other Topics Concern  . None   Social History Narrative   Review of Systems: Review of Systems  Constitutional: Positive for fever  (subjective) and chills. Negative for malaise/fatigue.  HENT: Positive for congestion, ear pain (right) and sore throat.        Rhinorrhea  Respiratory: Positive for cough and sputum production. Negative for shortness of breath and wheezing.   Cardiovascular: Negative for chest pain and leg swelling.  Gastrointestinal: Positive for diarrhea. Negative for nausea, vomiting, abdominal pain and constipation.  Genitourinary: Negative for dysuria, urgency and frequency.  Neurological: Positive for headaches. Negative for dizziness and weakness.     Objective:  Physical Exam: Filed Vitals:   07/13/15 1424  BP: 142/71  Pulse: 83  Temp: 98 F (36.7 C)  TempSrc: Oral  Weight: 206 lb 3.2 oz (93.532 kg)  SpO2: 97%    Physical Exam  Constitutional: She is oriented to person, place, and time. She appears well-nourished. No distress.  HENT:  Head: Normocephalic and atraumatic.  Right Ear: External ear normal.  Left Ear: External ear normal.  Nose: Nose normal.  Mouth/Throat: Oropharynx is clear and moist. No oropharyngeal exudate.  Tympanic membranes normal.   Eyes: Conjunctivae and EOM are normal. Pupils are equal, round, and reactive to light. Right eye exhibits no discharge. No scleral icterus.  Neck: Normal range of motion. Neck supple.  Cardiovascular: Normal rate, regular rhythm and normal heart sounds.   No murmur heard. Pulmonary/Chest: Effort normal and breath sounds normal. No respiratory distress. She has no wheezes. She has no rales.  Abdominal: Soft. Bowel sounds are normal. She exhibits no distension. There is no tenderness. There is no rebound and no guarding.  Musculoskeletal: Normal range of motion. She exhibits no edema or tenderness.  Neurological: She is alert and oriented to person, place, and time.  Skin: Skin is warm and dry. No rash noted. She is not diaphoretic. No erythema. No pallor.  Psychiatric: She has a normal mood and affect. Her behavior is normal. Judgment  and thought content normal.    Assessment & Plan:   Please see problem list for problem-based assessment and plan

## 2015-07-13 NOTE — Assessment & Plan Note (Addendum)
Once pt in normal health should receive PCV 13 and zoster vaccinations

## 2015-07-15 LAB — CULTURE, GROUP A STREP (THRC)

## 2015-07-16 DIAGNOSIS — J020 Streptococcal pharyngitis: Secondary | ICD-10-CM | POA: Insufficient documentation

## 2015-07-16 MED ORDER — PENICILLIN V POTASSIUM 500 MG PO TABS: 500.0000 mg | ORAL_TABLET | Freq: Two times a day (BID) | ORAL | Status: DC

## 2015-07-16 NOTE — Addendum Note (Signed)
Addended byJuluis Mire on: 07/16/2015 07:48 AM   Modules accepted: Orders

## 2015-07-16 NOTE — Progress Notes (Signed)
Medicine attending: Medical history, presenting problems, physical findings, and medications, reviewed with resident physician Dr Marjan Rabbani on the day of the patient visit and I concur with her evaluation and management plan. 

## 2015-08-07 ENCOUNTER — Ambulatory Visit: Payer: Self-pay | Admitting: Student in an Organized Health Care Education/Training Program

## 2015-11-21 ENCOUNTER — Telehealth: Payer: Self-pay | Admitting: Student in an Organized Health Care Education/Training Program

## 2015-11-21 ENCOUNTER — Ambulatory Visit: Payer: Self-pay | Admitting: Family Medicine

## 2015-11-21 NOTE — Telephone Encounter (Signed)
Needs appt asap

## 2015-11-22 NOTE — Telephone Encounter (Signed)
Spoke with patient today and she confirmed transferring care to [facility] in [place].  Dr. Evette Doffing removed as pcp.

## 2015-12-03 ENCOUNTER — Encounter: Payer: Self-pay | Admitting: Family Medicine

## 2015-12-03 ENCOUNTER — Ambulatory Visit (INDEPENDENT_AMBULATORY_CARE_PROVIDER_SITE_OTHER): Payer: Commercial Managed Care - HMO | Admitting: Family Medicine

## 2015-12-03 VITALS — BP 162/96 | HR 65 | Temp 97.6°F | Ht 61.25 in | Wt 207.2 lb

## 2015-12-03 DIAGNOSIS — J449 Chronic obstructive pulmonary disease, unspecified: Secondary | ICD-10-CM

## 2015-12-03 DIAGNOSIS — K449 Diaphragmatic hernia without obstruction or gangrene: Secondary | ICD-10-CM | POA: Diagnosis not present

## 2015-12-03 DIAGNOSIS — Z862 Personal history of diseases of the blood and blood-forming organs and certain disorders involving the immune mechanism: Secondary | ICD-10-CM

## 2015-12-03 DIAGNOSIS — R030 Elevated blood-pressure reading, without diagnosis of hypertension: Secondary | ICD-10-CM

## 2015-12-03 DIAGNOSIS — IMO0001 Reserved for inherently not codable concepts without codable children: Secondary | ICD-10-CM

## 2015-12-03 DIAGNOSIS — E669 Obesity, unspecified: Secondary | ICD-10-CM | POA: Diagnosis not present

## 2015-12-03 DIAGNOSIS — E042 Nontoxic multinodular goiter: Secondary | ICD-10-CM | POA: Diagnosis not present

## 2015-12-03 DIAGNOSIS — Z23 Encounter for immunization: Secondary | ICD-10-CM

## 2015-12-03 DIAGNOSIS — E785 Hyperlipidemia, unspecified: Secondary | ICD-10-CM | POA: Diagnosis not present

## 2015-12-03 DIAGNOSIS — M1990 Unspecified osteoarthritis, unspecified site: Secondary | ICD-10-CM | POA: Diagnosis not present

## 2015-12-03 DIAGNOSIS — I10 Essential (primary) hypertension: Secondary | ICD-10-CM | POA: Insufficient documentation

## 2015-12-03 DIAGNOSIS — Z1239 Encounter for other screening for malignant neoplasm of breast: Secondary | ICD-10-CM

## 2015-12-03 DIAGNOSIS — Z0001 Encounter for general adult medical examination with abnormal findings: Secondary | ICD-10-CM

## 2015-12-03 LAB — CBC
HCT: 37 % (ref 36.0–46.0)
Hemoglobin: 12.2 g/dL (ref 12.0–15.0)
MCHC: 32.9 g/dL (ref 30.0–36.0)
MCV: 86 fl (ref 78.0–100.0)
PLATELETS: 225 10*3/uL (ref 150.0–400.0)
RBC: 4.3 Mil/uL (ref 3.87–5.11)
RDW: 14 % (ref 11.5–15.5)
WBC: 6.4 10*3/uL (ref 4.0–10.5)

## 2015-12-03 LAB — COMPREHENSIVE METABOLIC PANEL
ALT: 21 U/L (ref 0–35)
AST: 22 U/L (ref 0–37)
Albumin: 4.2 g/dL (ref 3.5–5.2)
Alkaline Phosphatase: 64 U/L (ref 39–117)
BUN: 14 mg/dL (ref 6–23)
CALCIUM: 9.8 mg/dL (ref 8.4–10.5)
CO2: 30 mEq/L (ref 19–32)
CREATININE: 0.84 mg/dL (ref 0.40–1.20)
Chloride: 101 mEq/L (ref 96–112)
GFR: 71.59 mL/min (ref >60.00)
Glucose, Bld: 90 mg/dL (ref 70–99)
Potassium: 4.2 mEq/L (ref 3.5–5.1)
SODIUM: 137 meq/L (ref 135–145)
Total Bilirubin: 0.5 mg/dL (ref 0.2–1.2)
Total Protein: 7.4 g/dL (ref 6.0–8.3)

## 2015-12-03 LAB — LIPID PANEL
Cholesterol: 175 mg/dL (ref 0–200)
HDL: 55.5 mg/dL (ref >39.00)
LDL CALC: 103 mg/dL — AB (ref 0–99)
NonHDL: 119.08
TRIGLYCERIDES: 82 mg/dL (ref 0.0–149.0)
Total CHOL/HDL Ratio: 3
VLDL: 16.4 mg/dL (ref 0.0–40.0)

## 2015-12-03 LAB — HEMOGLOBIN A1C: Hgb A1c MFr Bld: 5.9 % (ref 4.6–6.5)

## 2015-12-03 MED ORDER — BECLOMETHASONE DIPROPIONATE 80 MCG/ACT IN AERS: 1.0000 | INHALATION_SPRAY | Freq: Two times a day (BID) | RESPIRATORY_TRACT | Status: DC

## 2015-12-03 NOTE — Assessment & Plan Note (Signed)
Mammogram ordered. Pneumovax given today. Screening labs today.  Declined zostavax. Remainder of preventative health care up to date.

## 2015-12-03 NOTE — Assessment & Plan Note (Signed)
Starting QVAR.

## 2015-12-03 NOTE — Assessment & Plan Note (Signed)
Needs follow up US. Order placed today.

## 2015-12-03 NOTE — Patient Instructions (Signed)
Start the QVAR (1 puff twice daily).  We will call regarding your Ultrasound and your lab results.  Follow up in 1 month.  Take care  Dr. Adriana Simas   Health Maintenance, Female Adopting a healthy lifestyle and getting preventive care can go a long way to promote health and wellness. Talk with your health care provider about what schedule of regular examinations is right for you. This is a good chance for you to check in with your provider about disease prevention and staying healthy. In between checkups, there are plenty of things you can do on your own. Experts have done a lot of research about which lifestyle changes and preventive measures are most likely to keep you healthy. Ask your health care provider for more information. WEIGHT AND DIET  Eat a healthy diet  Be sure to include plenty of vegetables, fruits, low-fat dairy products, and lean protein.  Do not eat a lot of foods high in solid fats, added sugars, or salt.  Get regular exercise. This is one of the most important things you can do for your health.  Most adults should exercise for at least 150 minutes each week. The exercise should increase your heart rate and make you sweat (moderate-intensity exercise).  Most adults should also do strengthening exercises at least twice a week. This is in addition to the moderate-intensity exercise.  Maintain a healthy weight  Body mass index (BMI) is a measurement that can be used to identify possible weight problems. It estimates body fat based on height and weight. Your health care provider can help determine your BMI and help you achieve or maintain a healthy weight.  For females 73 years of age and older:   A BMI below 18.5 is considered underweight.  A BMI of 18.5 to 24.9 is normal.  A BMI of 25 to 29.9 is considered overweight.  A BMI of 30 and above is considered obese.  Watch levels of cholesterol and blood lipids  You should start having your blood tested for lipids  and cholesterol at 68 years of age, then have this test every 5 years.  You may need to have your cholesterol levels checked more often if:  Your lipid or cholesterol levels are high.  You are older than 68 years of age.  You are at high risk for heart disease.  CANCER SCREENING   Lung Cancer  Lung cancer screening is recommended for adults 76-44 years old who are at high risk for lung cancer because of a history of smoking.  A yearly low-dose CT scan of the lungs is recommended for people who:  Currently smoke.  Have quit within the past 15 years.  Have at least a 30-pack-year history of smoking. A pack year is smoking an average of one pack of cigarettes a day for 1 year.  Yearly screening should continue until it has been 15 years since you quit.  Yearly screening should stop if you develop a health problem that would prevent you from having lung cancer treatment.  Breast Cancer  Practice breast self-awareness. This means understanding how your breasts normally appear and feel.  It also means doing regular breast self-exams. Let your health care provider know about any changes, no matter how small.  If you are in your 20s or 30s, you should have a clinical breast exam (CBE) by a health care provider every 1-3 years as part of a regular health exam.  If you are 78 or older, have a CBE every  year. Also consider having a breast X-ray (mammogram) every year.  If you have a family history of breast cancer, talk to your health care provider about genetic screening.  If you are at high risk for breast cancer, talk to your health care provider about having an MRI and a mammogram every year.  Breast cancer gene (BRCA) assessment is recommended for women who have family members with BRCA-related cancers. BRCA-related cancers include:  Breast.  Ovarian.  Tubal.  Peritoneal cancers.  Results of the assessment will determine the need for genetic counseling and BRCA1 and  BRCA2 testing. Cervical Cancer Your health care provider may recommend that you be screened regularly for cancer of the pelvic organs (ovaries, uterus, and vagina). This screening involves a pelvic examination, including checking for microscopic changes to the surface of your cervix (Pap test). You may be encouraged to have this screening done every 3 years, beginning at age 21.  For women ages 30-65, health care providers may recommend pelvic exams and Pap testing every 3 years, or they may recommend the Pap and pelvic exam, combined with testing for human papilloma virus (HPV), every 5 years. Some types of HPV increase your risk of cervical cancer. Testing for HPV may also be done on women of any age with unclear Pap test results.  Other health care providers may not recommend any screening for nonpregnant women who are considered low risk for pelvic cancer and who do not have symptoms. Ask your health care provider if a screening pelvic exam is right for you.  If you have had past treatment for cervical cancer or a condition that could lead to cancer, you need Pap tests and screening for cancer for at least 20 years after your treatment. If Pap tests have been discontinued, your risk factors (such as having a new sexual partner) need to be reassessed to determine if screening should resume. Some women have medical problems that increase the chance of getting cervical cancer. In these cases, your health care provider may recommend more frequent screening and Pap tests. Colorectal Cancer  This type of cancer can be detected and often prevented.  Routine colorectal cancer screening usually begins at 68 years of age and continues through 68 years of age.  Your health care provider may recommend screening at an earlier age if you have risk factors for colon cancer.  Your health care provider may also recommend using home test kits to check for hidden blood in the stool.  A small camera at the end of  a tube can be used to examine your colon directly (sigmoidoscopy or colonoscopy). This is done to check for the earliest forms of colorectal cancer.  Routine screening usually begins at age 50.  Direct examination of the colon should be repeated every 5-10 years through 68 years of age. However, you may need to be screened more often if early forms of precancerous polyps or small growths are found. Skin Cancer  Check your skin from head to toe regularly.  Tell your health care provider about any new moles or changes in moles, especially if there is a change in a mole's shape or color.  Also tell your health care provider if you have a mole that is larger than the size of a pencil eraser.  Always use sunscreen. Apply sunscreen liberally and repeatedly throughout the day.  Protect yourself by wearing long sleeves, pants, a wide-brimmed hat, and sunglasses whenever you are outside. HEART DISEASE, DIABETES, AND HIGH BLOOD PRESSURE     High blood pressure causes heart disease and increases the risk of stroke. High blood pressure is more likely to develop in:  People who have blood pressure in the high end of the normal range (130-139/85-89 mm Hg).  People who are overweight or obese.  People who are African American.  If you are 18-39 years of age, have your blood pressure checked every 3-5 years. If you are 40 years of age or older, have your blood pressure checked every year. You should have your blood pressure measured twice--once when you are at a hospital or clinic, and once when you are not at a hospital or clinic. Record the average of the two measurements. To check your blood pressure when you are not at a hospital or clinic, you can use:  An automated blood pressure machine at a pharmacy.  A home blood pressure monitor.  If you are between 55 years and 79 years old, ask your health care provider if you should take aspirin to prevent strokes.  Have regular diabetes screenings. This  involves taking a blood sample to check your fasting blood sugar level.  If you are at a normal weight and have a low risk for diabetes, have this test once every three years after 68 years of age.  If you are overweight and have a high risk for diabetes, consider being tested at a younger age or more often. PREVENTING INFECTION  Hepatitis B  If you have a higher risk for hepatitis B, you should be screened for this virus. You are considered at high risk for hepatitis B if:  You were born in a country where hepatitis B is common. Ask your health care provider which countries are considered high risk.  Your parents were born in a high-risk country, and you have not been immunized against hepatitis B (hepatitis B vaccine).  You have HIV or AIDS.  You use needles to inject street drugs.  You live with someone who has hepatitis B.  You have had sex with someone who has hepatitis B.  You get hemodialysis treatment.  You take certain medicines for conditions, including cancer, organ transplantation, and autoimmune conditions. Hepatitis C  Blood testing is recommended for:  Everyone born from 1945 through 1965.  Anyone with known risk factors for hepatitis C. Sexually transmitted infections (STIs)  You should be screened for sexually transmitted infections (STIs) including gonorrhea and chlamydia if:  You are sexually active and are younger than 68 years of age.  You are older than 68 years of age and your health care provider tells you that you are at risk for this type of infection.  Your sexual activity has changed since you were last screened and you are at an increased risk for chlamydia or gonorrhea. Ask your health care provider if you are at risk.  If you do not have HIV, but are at risk, it may be recommended that you take a prescription medicine daily to prevent HIV infection. This is called pre-exposure prophylaxis (PrEP). You are considered at risk if:  You are  sexually active and do not regularly use condoms or know the HIV status of your partner(s).  You take drugs by injection.  You are sexually active with a partner who has HIV. Talk with your health care provider about whether you are at high risk of being infected with HIV. If you choose to begin PrEP, you should first be tested for HIV. You should then be tested every 3 months for as   long as you are taking PrEP.  PREGNANCY   If you are premenopausal and you may become pregnant, ask your health care provider about preconception counseling.  If you may become pregnant, take 400 to 800 micrograms (mcg) of folic acid every day.  If you want to prevent pregnancy, talk to your health care provider about birth control (contraception). OSTEOPOROSIS AND MENOPAUSE   Osteoporosis is a disease in which the bones lose minerals and strength with aging. This can result in serious bone fractures. Your risk for osteoporosis can be identified using a bone density scan.  If you are 27 years of age or older, or if you are at risk for osteoporosis and fractures, ask your health care provider if you should be screened.  Ask your health care provider whether you should take a calcium or vitamin D supplement to lower your risk for osteoporosis.  Menopause may have certain physical symptoms and risks.  Hormone replacement therapy may reduce some of these symptoms and risks. Talk to your health care provider about whether hormone replacement therapy is right for you.  HOME CARE INSTRUCTIONS   Schedule regular health, dental, and eye exams.  Stay current with your immunizations.   Do not use any tobacco products including cigarettes, chewing tobacco, or electronic cigarettes.  If you are pregnant, do not drink alcohol.  If you are breastfeeding, limit how much and how often you drink alcohol.  Limit alcohol intake to no more than 1 drink per day for nonpregnant women. One drink equals 12 ounces of beer, 5  ounces of wine, or 1 ounces of hard liquor.  Do not use street drugs.  Do not share needles.  Ask your health care provider for help if you need support or information about quitting drugs.  Tell your health care provider if you often feel depressed.  Tell your health care provider if you have ever been abused or do not feel safe at home.   This information is not intended to replace advice given to you by your health care provider. Make sure you discuss any questions you have with your health care provider.   Document Released: 11/25/2010 Document Revised: 06/02/2014 Document Reviewed: 04/13/2013 Elsevier Interactive Patient Education [employer].

## 2015-12-03 NOTE — Assessment & Plan Note (Signed)
BP elevated today. Refused treatment. Will follow closely.

## 2015-12-03 NOTE — Progress Notes (Signed)
Subjective:  Patient ID: Madison Craig, female    DOB: 04/08/1946  Age: 70 y.o. MRN: [id number]  CC: Establish care  HPI Madison Craig is a 70 y.o. female presents to the clinic today to establish care. Issues/concerns are below.  Preventative Healthcare  Pap smear: No longer needed per screening guidelines.  Mammogram: In need of. Will order today.   Colonoscopy: Up to date.  Immunizations  Tetanus - Up to date.  Pneumococcal - In need of Pneumococcal vaccine today.  Zoster - Declines.   Hepatitis C screening - Done.  Labs: Screening labs today.  Exercise: No regular exercise.   Alcohol use: No.   Smoking/tobacco use: Former smoker.  Thyroid nodules  History of Multinodular goiter.  Has had biopsy of a thyroid nodule in 2015.  In need of follow-up ultrasound which has not been done.  SOB/cough  Patient with ongoing shortness of breath/cough which has been worse recently.  Patient has a history of mild obstructive airway disease per pulmonary function tests.  Uses albuterol. Not on a controller medication.  No recent fever, chills.  No know exacerbating or relieving factors.  PMH, Surgical Hx, Family Hx, Social History reviewed and updated as below.  Past Medical History  Diagnosis Date  . Anemia   . Arthritis   . GERD (gastroesophageal reflux disease)   . Multiple thyroid nodules     Thyroid ultrasound on 01/25/2014 showed a multinodular goiter, with a 1.5 cm nodule in the inferior left thyroid lobe that met the criteria for ultrasound-guided biopsy.  On 02/06/2014 an ultrasound guided needle aspirate biopsy was performed of the left inferior thyroid nodule; cytopathology findings were consistent with non-neoplastic goiter.     . Macular degeneration 01/11/2014    Followed at [facility] in [place], [place].   Marland Kitchen SLEEP APNEA 03/22/2009    Had sleep study in [place] about 10 years, has BiPAP, cannot sleep with it on.    Marland Kitchen  Dyspnea 09/28/2013    Pulmonary function testing done on 02/09/2014 showed minimal airway obstruction with a lack of response to bronchodilators; her FEV1 was normal, the FEV1/FVC ratio and the FEF 25-75% were reduced; the airway resistance was normal; lung volumes were within normal limits; the diffusing capacity was high for the measured volumes.    . Osteoporosis 09/18/2014    DXA 08/16/2014 showed osteopenia of right femur neck (T-score -2.3) and osteoporosis of left forearm radius (T-score -2.9).  Lumbar spine was not utilized due to advanced degenerative changes.    Past Surgical History  Procedure Laterality Date  . Breast surgery  1988    breast biopsy   Family History  Problem Relation Age of Onset  . Heart disease Father 100  . Sudden death Father   . Hypertension Father   . Hyperlipidemia Father   . Diabetes Son 74  . Stroke Mother   . Heart attack Mother 81  . Hypertension Mother   . Stroke Sister 28  . Alcohol abuse Sister   . Colon cancer Neg Hx   . Breast cancer Neg Hx    Social History  Substance Use Topics  . Smoking status: Former Smoker -- 2.00 packs/day for 16 years    Types: Cigarettes    Quit date: 09/29/1978  . Smokeless tobacco: Never Used  . Alcohol Use: No    Review of Systems  HENT: Positive for hearing loss.   Respiratory: Positive for shortness of breath.   All other systems reviewed and  are negative.  Objective:   Today's Vitals: BP 162/96 mmHg  Pulse 65  Temp(Src) 97.6 F (36.4 C) (Oral)  Ht 5' 1.25" (1.556 m)  Wt 207 lb 4 oz (94.008 kg)  BMI 38.83 kg/m2  SpO2 97%  Physical Exam  Constitutional: She is oriented to person, place, and time. She appears well-developed and well-nourished. No distress.  HENT:  Head: Normocephalic and atraumatic.  Nose: Nose normal.  Mouth/Throat: Oropharynx is clear and moist. No oropharyngeal exudate.  Normal TM's bilaterally.   Eyes: Conjunctivae are normal. No scleral icterus.  Neck: Neck supple. No  thyromegaly present.  Cardiovascular: Normal rate and regular rhythm.   No murmur heard. Pulmonary/Chest: Effort normal and breath sounds normal. She has no wheezes. She has no rales.  Abdominal: Soft. She exhibits no distension. There is no tenderness. There is no rebound and no guarding.  Musculoskeletal: Normal range of motion. She exhibits no edema.  Lymphadenopathy:    She has no cervical adenopathy.  Neurological: She is alert and oriented to person, place, and time.  Skin: Skin is warm and dry. No rash noted.  Psychiatric: She has a normal mood and affect.  Vitals reviewed.  Assessment & Plan:   Problem List Items Addressed This Visit    Multinodular goiter    Needs follow up US. Order placed today.      Relevant Orders   US Soft Tissue Head/Neck   OAD (obstructive airway disease) ([place])    Starting QVAR.      Relevant Medications   beclomethasone (QVAR) 80 MCG/ACT inhaler   Encounter for preventative adult health care exam with abnormal findings - Primary    Mammogram ordered. Pneumovax given today. Screening labs today.  Declined zostavax. Remainder of preventative health care up to date.       Elevated BP    BP elevated today. Refused treatment. Will follow closely.      Relevant Orders   Comp Met (CMET) (Completed)    Other Visit Diagnoses    History of anemia        Relevant Orders    CBC (Completed)    Hyperlipidemia        Relevant Orders    Lipid Profile (Completed)    Obesity (BMI 30-39.9)        Relevant Orders    HgB A1c (Completed)    Screening for breast cancer        Relevant Orders    MM DIGITAL SCREENING BILATERAL    Need for pneumococcal vaccination        Relevant Orders    Pneumococcal polysaccharide vaccine 23-valent greater than or equal to 2yo subcutaneous/IM (Completed)       Outpatient Encounter Prescriptions as of 12/03/2015  Medication Sig  . diclofenac (VOLTAREN) 75 MG EC tablet TAKE ONE TABLET BY MOUTH ONCE DAILY  .  omeprazole (PRILOSEC) 40 MG capsule Take 40 mg by mouth daily.  . beclomethasone (QVAR) 80 MCG/ACT inhaler Inhale 1 puff into the lungs 2 (two) times daily.  . [DISCONTINUED] albuterol (PROVENTIL HFA;VENTOLIN HFA) 108 (90 BASE) MCG/ACT inhaler Inhale 2 puffs into the lungs every 6 (six) hours as needed for wheezing or shortness of breath. (Patient not taking: Reported on 07/05/2014)  . [DISCONTINUED] penicillin v potassium (VEETID) 500 MG tablet Take 1 tablet (500 mg total) by mouth 2 (two) times daily.   No facility-administered encounter medications on file as of 12/03/2015.    Follow-up: 1 month.  [facility]  [employer]

## 2015-12-05 ENCOUNTER — Other Ambulatory Visit: Payer: Self-pay | Admitting: Family Medicine

## 2015-12-05 MED ORDER — ATORVASTATIN CALCIUM 40 MG PO TABS: 40.0000 mg | ORAL_TABLET | Freq: Every day | ORAL | Status: DC

## 2015-12-10 ENCOUNTER — Ambulatory Visit
Admission: RE | Admit: 2015-12-10 | Discharge: 2015-12-10 | Disposition: A | Discharge status: Home / Self Care | Payer: Commercial Managed Care - HMO | Source: Ambulatory Visit | Attending: Family Medicine | Admitting: Family Medicine

## 2015-12-10 DIAGNOSIS — E042 Nontoxic multinodular goiter: Secondary | ICD-10-CM | POA: Insufficient documentation

## 2015-12-10 DIAGNOSIS — E041 Nontoxic single thyroid nodule: Secondary | ICD-10-CM | POA: Diagnosis not present

## 2015-12-20 ENCOUNTER — Other Ambulatory Visit: Payer: Self-pay | Admitting: Family Medicine

## 2015-12-20 ENCOUNTER — Ambulatory Visit
Admission: RE | Admit: 2015-12-20 | Discharge: 2015-12-20 | Disposition: A | Discharge status: Home / Self Care | Payer: Commercial Managed Care - HMO | Source: Ambulatory Visit | Attending: Family Medicine | Admitting: Family Medicine

## 2015-12-20 DIAGNOSIS — Z1231 Encounter for screening mammogram for malignant neoplasm of breast: Secondary | ICD-10-CM | POA: Insufficient documentation

## 2015-12-20 DIAGNOSIS — Z1239 Encounter for other screening for malignant neoplasm of breast: Secondary | ICD-10-CM

## 2016-01-03 ENCOUNTER — Ambulatory Visit: Payer: Self-pay | Admitting: Family Medicine

## 2016-01-23 ENCOUNTER — Encounter (INDEPENDENT_AMBULATORY_CARE_PROVIDER_SITE_OTHER): Payer: Self-pay

## 2016-01-23 ENCOUNTER — Ambulatory Visit (INDEPENDENT_AMBULATORY_CARE_PROVIDER_SITE_OTHER): Payer: Commercial Managed Care - HMO | Admitting: Family Medicine

## 2016-01-23 DIAGNOSIS — K219 Gastro-esophageal reflux disease without esophagitis: Secondary | ICD-10-CM

## 2016-01-23 DIAGNOSIS — M1990 Unspecified osteoarthritis, unspecified site: Secondary | ICD-10-CM | POA: Diagnosis not present

## 2016-01-23 DIAGNOSIS — IMO0001 Reserved for inherently not codable concepts without codable children: Secondary | ICD-10-CM

## 2016-01-23 DIAGNOSIS — R030 Elevated blood-pressure reading, without diagnosis of hypertension: Secondary | ICD-10-CM

## 2016-01-23 DIAGNOSIS — M810 Age-related osteoporosis without current pathological fracture: Secondary | ICD-10-CM | POA: Diagnosis not present

## 2016-01-23 DIAGNOSIS — Z23 Encounter for immunization: Secondary | ICD-10-CM | POA: Diagnosis not present

## 2016-01-23 DIAGNOSIS — E042 Nontoxic multinodular goiter: Secondary | ICD-10-CM

## 2016-01-23 MED ORDER — NAPROXEN 500 MG PO TABS: 500.0000 mg | ORAL_TABLET | Freq: Two times a day (BID) | ORAL | 6 refills | Status: DC

## 2016-01-23 NOTE — Progress Notes (Signed)
Pre visit review using our clinic review tool, if applicable. No additional management support is needed unless otherwise documented below in the visit note. 

## 2016-01-23 NOTE — Assessment & Plan Note (Signed)
BP mildly elevated today. Given age, goal is less than 150/90. We'll continue to monitor closely. Patient to check blood pressures regularly at home.

## 2016-01-23 NOTE — Assessment & Plan Note (Signed)
Stable. Starting naproxen 500 mg twice a day PRN. Cautioned about chronic/frequent use. On PPI.

## 2016-01-23 NOTE — Patient Instructions (Signed)
Take the naproxen twice daily as needed.  Follow up in 6 months.  Keep an eye on your BP.  Take care  Dr. Lacinda Axon

## 2016-01-23 NOTE — Assessment & Plan Note (Signed)
Uncontrolled/untreated.  Discussed treatment options today. Patient will think about treatment options and consider.

## 2016-01-23 NOTE — Assessment & Plan Note (Addendum)
Stable per ultrasound. Follow-up ultrasound in 12 months - July 2018.

## 2016-01-23 NOTE — Assessment & Plan Note (Signed)
Stable on omeprazole  Continue

## 2016-01-23 NOTE — Progress Notes (Signed)
Subjective:  Patient ID: Madison Craig, female    DOB: Jan 26, 1946  Age: 70 y.o. MRN: [id number]  CC: Follow up  HPI:  70 year old female with a past medical history of sleep apnea, multinodular goiter, OA, osteoporosis, GERD, elevated BP presents for follow-up.  OA  Patient has a long-standing history of osteoarthritis.  She has been recently taking naproxen and is doing fairly well on this.  She would like to discuss starting prescription strength naproxen today.  GERD  Stable on omeprazole.  Osteoporosis  Patient is on calcium and vitamin D at this time.   Is not on any other treatment. This is uncontrolled at this time.  Will discuss today.  Elevated BP  Blood pressure was significantly elevated at our last visit.  BP much improved today.  We'll discuss today.  Multinodular goiter  Stable.  Had ultrasound.  Will review and discuss today.  Social Hx   Social History   Social History  . Marital status: Widowed    Spouse name: N/A  . Number of children: 3  . Years of education: N/A   Social History Main Topics  . Smoking status: Former Smoker    Packs/day: 2.00    Years: 16.00    Types: Cigarettes    Quit date: 09/29/1978  . Smokeless tobacco: Never Used  . Alcohol use No  . Drug use: No  . Sexual activity: Not on file   Other Topics Concern  . Not on file   Social History Narrative  . No narrative on file   Review of Systems  Constitutional: Negative.   Musculoskeletal: Positive for arthralgias.  Neurological: Positive for numbness.   Objective:  BP 140/84 (BP Location: Left Arm, Patient Position: Sitting, Cuff Size: Large)   Pulse 69   Temp 97.9 F (36.6 C) (Oral)   Wt 205 lb 8 oz (93.2 kg)   SpO2 96%   BMI 38.51 kg/m   BP/Weight 01/23/2016 12/03/2015 99991111  Systolic BP [id number]  Diastolic BP 84 96 71  Wt. (Lbs) 205.5 207.25 206.2  BMI 38.51 38.83 37.71   Physical Exam  Constitutional: She is oriented to  person, place, and time. She appears well-developed. No distress.  Cardiovascular: Normal rate and regular rhythm.   Pulmonary/Chest: Effort normal. She has no wheezes. She has no rales.  Musculoskeletal:  Heberden's nodes noted.   Neurological: She is alert and oriented to person, place, and time.  Psychiatric: She has a normal mood and affect.  Vitals reviewed.  Lab Results  Component Value Date   WBC 6.4 12/03/2015   HGB 12.2 12/03/2015   HCT 37.0 12/03/2015   PLT 225.0 12/03/2015   GLUCOSE 90 12/03/2015   CHOL 175 12/03/2015   TRIG 82.0 12/03/2015   HDL 55.50 12/03/2015   LDLCALC 103 (H) 12/03/2015   ALT 21 12/03/2015   AST 22 12/03/2015   NA 137 12/03/2015   K 4.2 12/03/2015   CL 101 12/03/2015   CREATININE 0.84 12/03/2015   BUN 14 12/03/2015   CO2 30 12/03/2015   TSH 1.767 01/11/2014   HGBA1C 5.9 12/03/2015    Assessment & Plan:   Problem List Items Addressed This Visit    Osteoarthritis    Stable. Starting naproxen 500 mg twice a day PRN. Cautioned about chronic/frequent use. On PPI.      Relevant Medications   naproxen (NAPROSYN) 500 MG tablet   Multinodular goiter    Stable per ultrasound. Follow-up ultrasound in 12 months -  July 2018.      GERD (gastroesophageal reflux disease)    Stable on omeprazole. Continue.      Osteoporosis    Uncontrolled/untreated.  Discussed treatment options today. Patient will think about treatment options and consider.      Elevated BP    BP mildly elevated today. Given age, goal is less than 150/90. We'll continue to monitor closely. Patient to check blood pressures regularly at home.       Other Visit Diagnoses    Encounter for immunization       Relevant Orders   Flu Vaccine QUAD 36+ mos IM (Completed)      Meds ordered this encounter  Medications  . naproxen (NAPROSYN) 500 MG tablet    Sig: Take 1 tablet (500 mg total) by mouth 2 (two) times daily with a meal. As needed.    Dispense:  60 tablet     Refill:  6    Follow-up: Return in about 6 months (around 07/23/2016).  [place]

## 2016-06-22 ENCOUNTER — Other Ambulatory Visit: Payer: Self-pay | Admitting: Student in an Organized Health Care Education/Training Program

## 2016-06-30 ENCOUNTER — Telehealth: Payer: Self-pay | Admitting: Family Medicine

## 2016-06-30 NOTE — Telephone Encounter (Signed)
I called pt and left a vm to call the office to sch a AWV. Thank you! °

## 2016-07-15 IMAGING — CR DG CHEST 2V
2 series · 2 of 2 positions shown · non-contrast
Comparison: July 05, 2014

CLINICAL DATA: Productive cough for 2 months

EXAM:
CHEST  2 VIEW

[w chest pa]
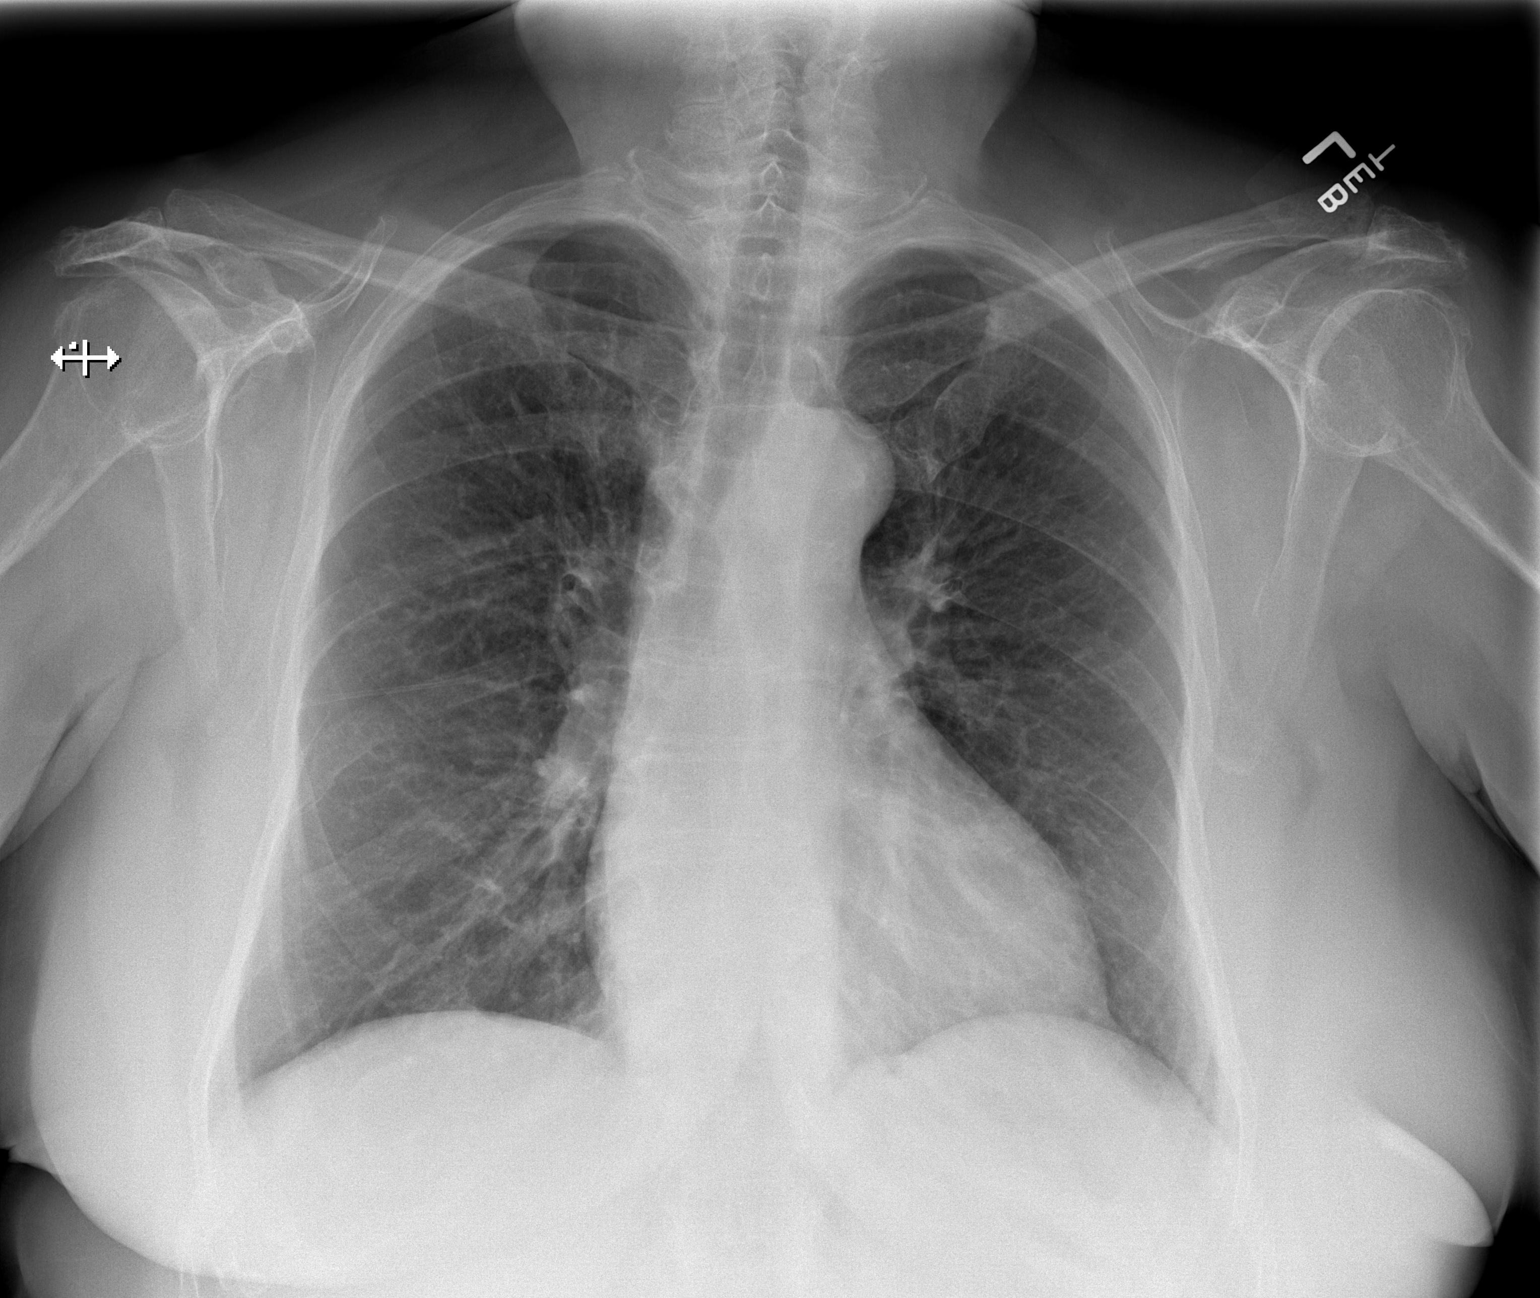

[w chest lat]
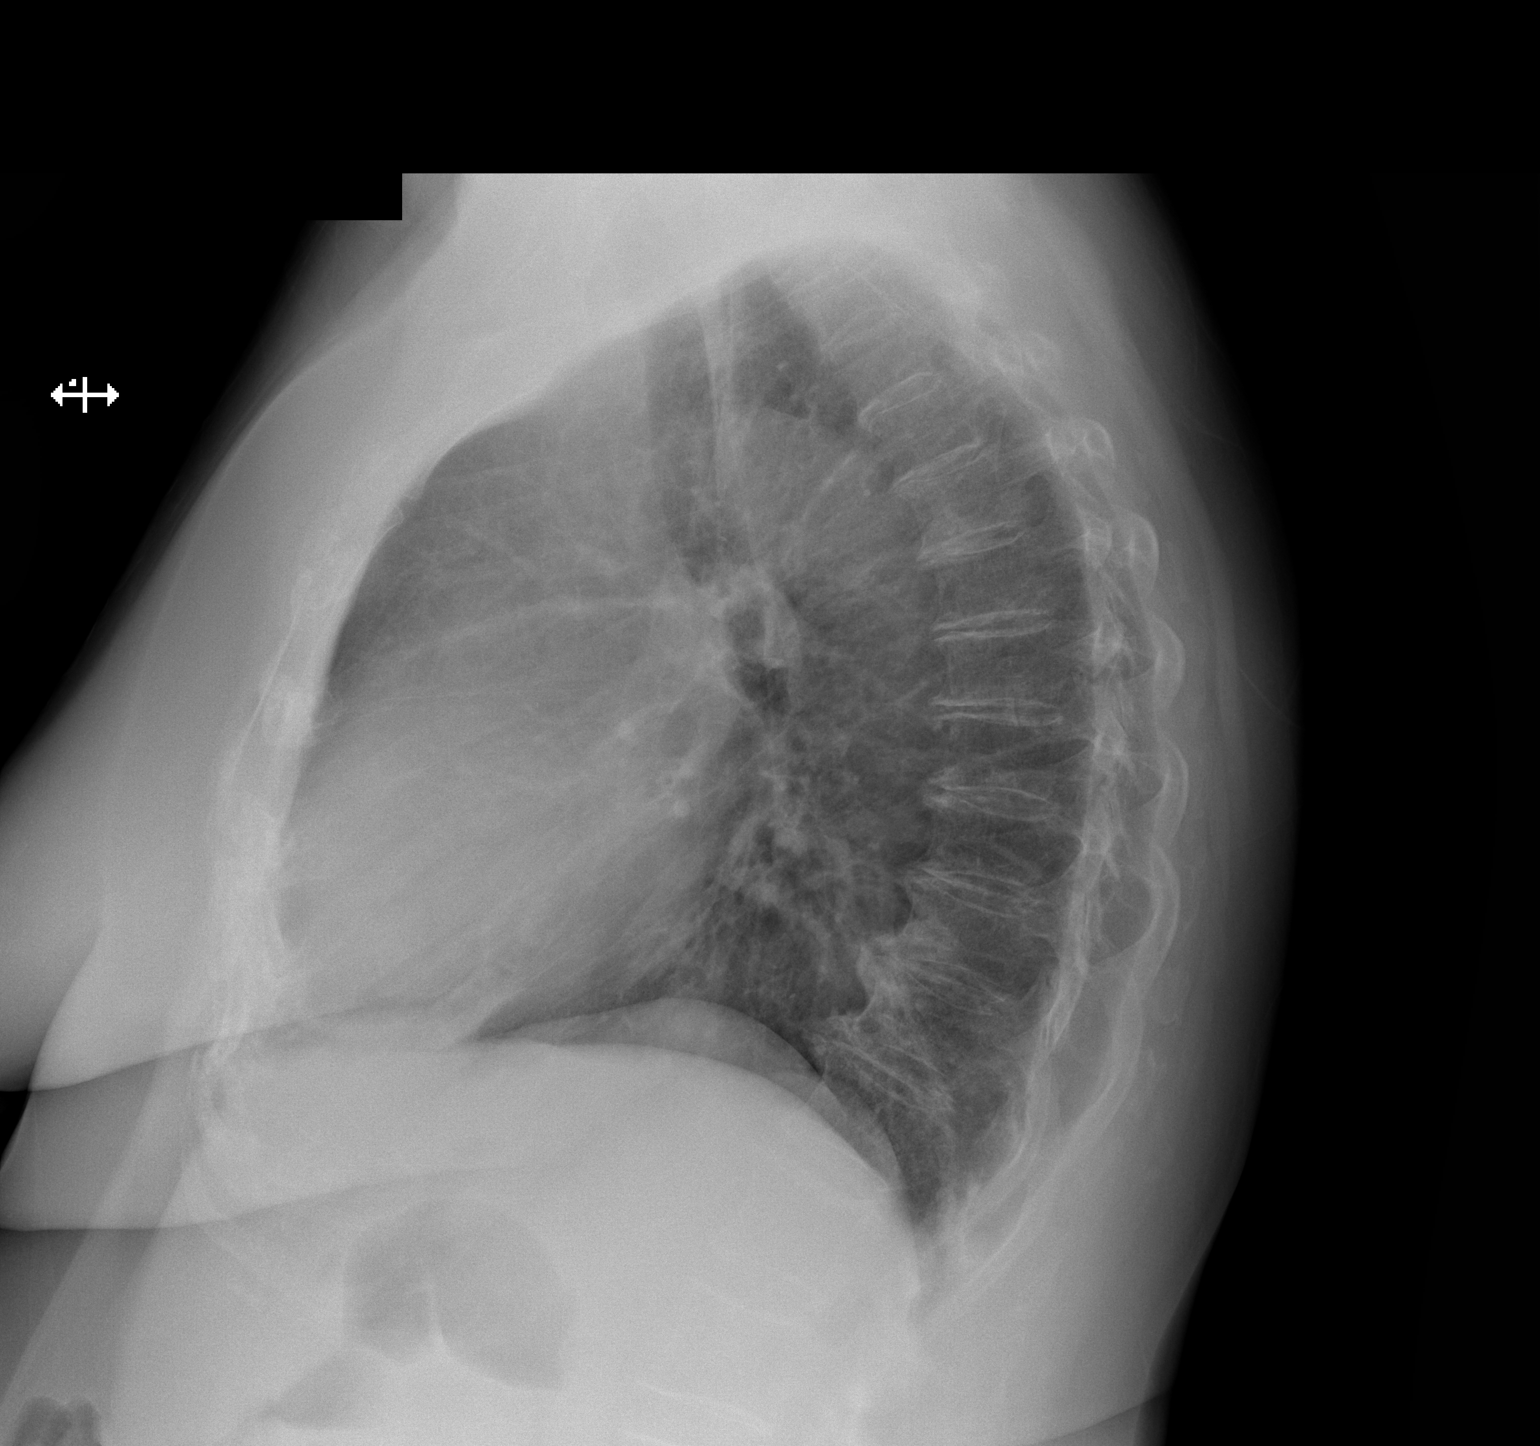

[2 of 2 positions shown; findings below may reference images not displayed]

FINDINGS: There is no edema or consolidation. There is central peribronchial
thickening, stable. Heart size and pulmonary vascularity are normal.
No adenopathy. There is degenerative change in thoracic spine.
IMPRESSION: Evidence a degree of chronic bronchitis, stable. No edema or
consolidation.

## 2016-07-24 ENCOUNTER — Ambulatory Visit (INDEPENDENT_AMBULATORY_CARE_PROVIDER_SITE_OTHER): Payer: PPO | Admitting: Family Medicine

## 2016-07-24 ENCOUNTER — Encounter: Payer: Self-pay | Admitting: Family Medicine

## 2016-07-24 DIAGNOSIS — M810 Age-related osteoporosis without current pathological fracture: Secondary | ICD-10-CM | POA: Diagnosis not present

## 2016-07-24 DIAGNOSIS — I10 Essential (primary) hypertension: Secondary | ICD-10-CM

## 2016-07-24 DIAGNOSIS — K219 Gastro-esophageal reflux disease without esophagitis: Secondary | ICD-10-CM | POA: Diagnosis not present

## 2016-07-24 DIAGNOSIS — M1990 Unspecified osteoarthritis, unspecified site: Secondary | ICD-10-CM

## 2016-07-24 MED ORDER — DICLOFENAC SODIUM 75 MG PO TBEC: 75.0000 mg | DELAYED_RELEASE_TABLET | Freq: Two times a day (BID) | ORAL | 1 refills | Status: DC | PRN

## 2016-07-24 MED ORDER — HYDROCOD POLST-CPM POLST ER 10-8 MG/5ML PO SUER: 5.0000 mL | Freq: Two times a day (BID) | ORAL | 0 refills | Status: DC | PRN

## 2016-07-24 MED ORDER — RALOXIFENE HCL 60 MG PO TABS: 60.0000 mg | ORAL_TABLET | Freq: Every day | ORAL | 3 refills | Status: DC

## 2016-07-24 MED ORDER — OMEPRAZOLE 40 MG PO CPDR: 40.0000 mg | DELAYED_RELEASE_CAPSULE | Freq: Every day | ORAL | 3 refills | Status: DC

## 2016-07-24 MED ORDER — AMOXICILLIN-POT CLAVULANATE 875-125 MG PO TABS: 1.0000 | ORAL_TABLET | Freq: Two times a day (BID) | ORAL | 0 refills | Status: DC

## 2016-07-24 NOTE — Assessment & Plan Note (Signed)
Switching to diclofenac.

## 2016-07-24 NOTE — Progress Notes (Signed)
Pre visit review using our clinic review tool, if applicable. No additional management support is needed unless otherwise documented below in the visit note. 

## 2016-07-24 NOTE — Assessment & Plan Note (Signed)
Starting Evista.

## 2016-07-24 NOTE — Assessment & Plan Note (Signed)
New Problem. Patient meets criteria for hypertension. She does not want treatment at this time.

## 2016-07-24 NOTE — Progress Notes (Signed)
Subjective:  Patient ID: Madison Craig, female    DOB: [birth year]  Age: 71 y.o. MRN: [id number]  CC: Follow up  HPI:  71 year old female with OSA, osteoarthritis, osteoporosis, multinodular goiter, GERD presents for follow-up.  Osteoporosis  Last bone density scan was in 2016.  She is not on treatment at this time. Will discuss today.  Was previously on Evista as she could not tolerate bisphosphonate.  GERD  Stable on Omeprazole.  Needs refill.  Elevated BP  BP elevated today.  Will discuss.  OA  Stable.  Wants to switch from naproxen to diclofenac.  Social Hx   Social History   Social History  . Marital status: Widowed    Spouse name: N/A  . Number of children: 3  . Years of education: N/A   Social History Main Topics  . Smoking status: Former Smoker    Packs/day: 2.00    Years: 16.00    Types: Cigarettes    Quit date: 09/29/1978  . Smokeless tobacco: Never Used  . Alcohol use No  . Drug use: No  . Sexual activity: Not Asked   Other Topics Concern  . None   Social History Narrative  . None    Review of Systems  Constitutional: Negative.   Musculoskeletal: Positive for arthralgias.   Objective:  BP (!) 142/72   Pulse 68   Temp 98.6 F (37 C) (Oral)   Wt 196 lb 12.8 oz (89.3 kg)   SpO2 95%   BMI 36.88 kg/m   BP/Weight 07/24/2016 01/23/2016 [id number]  Systolic BP [id number]  Diastolic BP 72 84 96  Wt. (Lbs) 196.8 205.5 207.25  BMI 36.88 38.51 38.83    Physical Exam  Constitutional: She is oriented to person, place, and time. She appears well-developed. No distress.  Cardiovascular: Normal rate and regular rhythm.   Pulmonary/Chest: Effort normal and breath sounds normal.  Neurological: She is alert and oriented to person, place, and time.  Psychiatric: She has a normal mood and affect.  Vitals reviewed.   Lab Results  Component Value Date   WBC 6.4 12/03/2015   HGB 12.2 12/03/2015   HCT 37.0 12/03/2015   PLT 225.0  12/03/2015   GLUCOSE 90 12/03/2015   CHOL 175 12/03/2015   TRIG 82.0 12/03/2015   HDL 55.50 12/03/2015   LDLCALC 103 (H) 12/03/2015   ALT 21 12/03/2015   AST 22 12/03/2015   NA 137 12/03/2015   K 4.2 12/03/2015   CL 101 12/03/2015   CREATININE 0.84 12/03/2015   BUN 14 12/03/2015   CO2 30 12/03/2015   TSH 1.767 01/11/2014   HGBA1C 5.9 12/03/2015    Assessment & Plan:   Problem List Items Addressed This Visit    Osteoporosis    Starting Evista.      Relevant Medications   raloxifene (EVISTA) 60 MG tablet   Osteoarthritis    Switching to diclofenac.      Relevant Medications   diclofenac (VOLTAREN) 75 MG EC tablet   GERD (gastroesophageal reflux disease)    Stable. Refilling Omeprazole.      Relevant Medications   omeprazole (PRILOSEC) 40 MG capsule   Essential hypertension    New Problem. Patient meets criteria for hypertension. She does not want treatment at this time.         Meds ordered this encounter  Medications  . omeprazole (PRILOSEC) 40 MG capsule    Sig: Take 1 capsule (40 mg total) by mouth daily.  Dispense:  90 capsule    Refill:  3  . diclofenac (VOLTAREN) 75 MG EC tablet    Sig: Take 1 tablet (75 mg total) by mouth 2 (two) times daily as needed.    Dispense:  60 tablet    Refill:  1  . DISCONTD: amoxicillin-clavulanate (AUGMENTIN) 875-125 MG tablet    Sig: Take 1 tablet by mouth 2 (two) times daily.    Dispense:  20 tablet    Refill:  0  . DISCONTD: chlorpheniramine-HYDROcodone (TUSSIONEX PENNKINETIC ER) 10-8 MG/5ML SUER    Sig: Take 5 mLs by mouth every 12 (twelve) hours as needed.    Dispense:  115 mL    Refill:  0  . raloxifene (EVISTA) 60 MG tablet    Sig: Take 1 tablet (60 mg total) by mouth daily.    Dispense:  90 tablet    Refill:  3    Follow-up: 6 months.  [place]

## 2016-07-24 NOTE — Patient Instructions (Signed)
Continue your meds.  Follow up in 6 months.  Consider repeating dexa vs starting medication.  Take care  Dr. Lacinda Axon

## 2016-07-24 NOTE — Assessment & Plan Note (Signed)
Stable. Refilling Omeprazole.

## 2016-08-21 ENCOUNTER — Ambulatory Visit (INDEPENDENT_AMBULATORY_CARE_PROVIDER_SITE_OTHER): Payer: PPO

## 2016-08-21 VITALS — BP 142/78 | HR 64 | Temp 97.6°F | Resp 14 | Ht 61.0 in | Wt 194.1 lb

## 2016-08-21 DIAGNOSIS — Z0000 Encounter for general adult medical examination without abnormal findings: Secondary | ICD-10-CM | POA: Diagnosis not present

## 2016-08-21 DIAGNOSIS — H9190 Unspecified hearing loss, unspecified ear: Secondary | ICD-10-CM

## 2016-08-21 NOTE — Progress Notes (Signed)
Subjective:   Madison Craig is a 71 y.o. female who presents for an Initial Medicare Annual Wellness Visit.  Review of Systems    No ROS.  Medicare Wellness Visit.  Cardiac Risk Factors include: advanced age (>43mn, >>55women);hypertension;obesity (BMI >30kg/m2)     Objective:    Today's Vitals   08/21/16 1100  BP: (!) 142/78  Pulse: 64  Resp: 14  Temp: 97.6 F (36.4 C)  TempSrc: Oral  SpO2: 95%  Weight: 194 lb 1.9 oz (88.1 kg)  Height: 5' 1"  (1.549 m)   Body mass index is 36.68 kg/m.   Current Medications (verified) Outpatient Encounter Prescriptions as of 08/21/2016  Medication Sig  . diclofenac (VOLTAREN) 75 MG EC tablet Take 1 tablet (75 mg total) by mouth 2 (two) times daily as needed.  .Marland Kitchenomeprazole (PRILOSEC) 40 MG capsule Take 1 capsule (40 mg total) by mouth daily.  . raloxifene (EVISTA) 60 MG tablet Take 1 tablet (60 mg total) by mouth daily.  . [DISCONTINUED] beclomethasone (QVAR) 80 MCG/ACT inhaler Inhale 1 puff into the lungs 2 (two) times daily.   No facility-administered encounter medications on file as of 08/21/2016.     Allergies (verified) Patient has no known allergies.   History: Past Medical History:  Diagnosis Date  . Anemia   . Arthritis   . Dyspnea 09/28/2013   Pulmonary function testing done on 02/09/2014 showed minimal airway obstruction with a lack of response to bronchodilators; her FEV1 was normal, the FEV1/FVC ratio and the FEF 25-75% were reduced; the airway resistance was normal; lung volumes were within normal limits; the diffusing capacity was high for the measured volumes.    .Marland KitchenGERD (gastroesophageal reflux disease)   . Macular degeneration 01/11/2014   [facility] [place]   . Multiple thyroid nodules    Thyroid ultrasound on 01/25/2014 showed a multinodular goiter, with a 1.5 cm nodule in the inferior left thyroid lobe that met the criteria for ultrasound-guided biopsy.  On 02/06/2014 an ultrasound  guided needle aspirate biopsy was performed of the left inferior thyroid nodule; cytopathology findings were consistent with non-neoplastic goiter.     . Osteoporosis 09/18/2014   DXA 08/16/2014 showed osteopenia of right femur neck (T-score -2.3) and osteoporosis of left forearm radius (T-score -2.9).  Lumbar spine was not utilized due to advanced degenerative changes.   .Marland KitchenSLEEP APNEA 03/22/2009   [place], cannot sleep with it on.     Past Surgical History:  Procedure Laterality Date  . BREAST SURGERY  1988   breast biopsy   Family History  Problem Relation Age of Onset  . Heart disease Father 418 . Sudden death Father   . Hypertension Father   . Hyperlipidemia Father   . Heart attack Father   . Diabetes Son 450 . Stroke Mother   . Hypertension Mother   . Dementia Mother   . Stroke Sister 667 . Alcohol abuse Sister   . Colon cancer Neg Hx   . Breast cancer Neg Hx    Social History   Occupational History  . Not on file.   Social History Main Topics  . Smoking status: Former Smoker    Packs/day: 2.00    Years: 16.00    Types: Cigarettes    Quit date: 09/29/1978  . Smokeless tobacco: Never Used  . Alcohol use No  . Drug use: No  . Sexual activity:  No    Tobacco Counseling Counseling given: Not Answered   Activities of Daily Living In your present state of health, do you have any difficulty performing the following activities: 08/21/2016  Hearing? Y  Vision? N  Difficulty concentrating or making decisions? N  Walking or climbing stairs? Y  Dressing or bathing? N  Doing errands, shopping? N  Preparing Food and eating ? N  Using the Toilet? N  In the past six months, have you accidently leaked urine? N  Do you have problems with loss of bowel control? N  Managing your Medications? N  Managing your Finances? N  Housekeeping or managing your Housekeeping? N  Some recent data might be hidden    Immunizations and  Health Maintenance Immunization History  Administered Date(s) Administered  . Influenza,inj,Quad PF,36+ Mos 04/05/2014, 02/05/2015, 01/23/2016  . Pneumococcal Conjugate-13 01/11/2014  . Pneumococcal Polysaccharide-23 12/03/2015  . Tdap 01/11/2014   There are no preventive care reminders to display for this patient.  Patient Care Team: Coral Spikes, DO as PCP - General (Family Medicine)  Indicate any recent Medical Services you may have received from other than [facility] providers in the past year (date may be approximate).     Assessment:   This is a routine wellness examination for Madison Craig. The goal of the wellness visit is to assist the patient how to close the gaps in care and create a preventative care plan for the patient.   Osteoporosis reviewed.  Medications reviewed; taking without issues or barriers.  Safety issues reviewed; smoke detectors in the home. No firearms in the home.  Wears seatbelts when driving or riding with others. Patient does wear sunscreen or protective clothing when in direct sunlight. No violence in the home.  Patient is alert, normal appearance, oriented to person/place/and time. Correctly identified the president of the [place], recall of 3/3 words, and performing simple calculations.  Patient displays appropriate judgement and can read correct time from watch face.  No new identified risk were noted.  No failures at ADL's or IADL's.   BMI- discussed the importance of a healthy diet, water intake and exercise. Educational material provided.   HTN- followed by PCP.  Dental- wears dentures.  Sleep patterns- Sleeps 6-7 hours at night.  Wakes feeling rested. Bi-PAP in use.  Health maintenance gaps- closed.  Patient Concerns: None at this time. Follow up with PCP as needed.  Hearing/Vision screen Hearing Screening Comments: Patient does have difficulty hearing conversational tones.  Audiology referral placed.   Vision Screening Comments:  Followed by [facility]) Macular degeneration Legally blind, R eye Wears corrective lenses when reading Visual acuity not assessed per patient preference since they have regular follow up with the ophthalmologist  Dietary issues and exercise activities discussed: Current Exercise Habits: Home exercise routine (Working at a daycare 5 days weekly, 7 hours), Type of exercise: walking, Time (Minutes): 20, Frequency (Times/Week): 5, Weekly Exercise (Minutes/Week): 100, Intensity: Moderate  Goals    . Increase water intake          Stay hydrated      Depression Screen PHQ 2/9 Scores 07/24/2016 07/13/2015 02/05/2015 10/03/2014 04/05/2014 09/28/2013  PHQ - 2 Score 0 0 0 0 0 0    Fall Risk Fall Risk  07/24/2016 07/13/2015 02/05/2015 10/03/2014 04/05/2014  Falls in the past year? No No No No No    Cognitive Function: MMSE - Mini Mental State Exam 08/21/2016  Orientation to time 5  Orientation to Place 5  Registration  3  Attention/ Calculation 5  Recall 3  Language- name 2 objects 2  Language- repeat 1  Language- follow 3 step command 3  Language- read & follow direction 1  Write a sentence 1  Copy design 1  Total score 30        Screening Tests Health Maintenance  Topic Date Due  . MAMMOGRAM  12/19/2017  . COLONOSCOPY  02/16/2023  . TETANUS/TDAP  01/12/2024  . INFLUENZA VACCINE  Completed  . DEXA SCAN  Completed  . Hepatitis C Screening  Completed  . PNA vac Low Risk Adult  Completed      Plan:   End of life planning; Advanced aging; Advanced directives discussed.  No HCPOA/Living Will.  Additional information declined at this time.  Medicare Attestation I have personally reviewed: The patient's medical and social history Their use of alcohol, tobacco or illicit drugs Their current medications and supplements The patient's functional ability including ADLs,fall risks, home safety risks, cognitive, and hearing and visual impairment Diet and physical  activities Evidence for depression   The patient's weight, height, BMI, and visual acuity have been recorded in the chart.  I have made referrals and provided education to the patient based on review of the above and I have provided the patient with a written personalized care plan for preventive services.    During the course of the visit, Mafalda was educated and counseled about the following appropriate screening and preventive services:   Vaccines to include Pneumoccal, Influenza, Hepatitis B, Td, Zostavax, HCV  Colorectal cancer screening-UTD  Bone density screening-UTD  Glaucoma-annual eye exams  Mammography-UTD  Nutrition counseling  Patient Instructions (the written plan) were given to the patient.    Varney Biles, LPN   7/58/8325

## 2016-08-21 NOTE — Patient Instructions (Addendum)
Madison Craig , Thank you for taking time to come for your Medicare Wellness Visit. I appreciate your ongoing commitment to your health goals. Please review the following plan we discussed and let me know if I can assist you in the future.   Follow up with Dr. Lacinda Axon as needed.    Audiology testing referral placed. Follow as directed.  Have a great day!  These are the goals we discussed: Goals    . Increase water intake          Stay hydrated       This is a list of the screening recommended for you and due dates:  Health Maintenance  Topic Date Due  . Mammogram  12/19/2017  . Colon Cancer Screening  02/16/2023  . Tetanus Vaccine  01/12/2024  . Flu Shot  Completed  . DEXA scan (bone density measurement)  Completed  .  Hepatitis C: One time screening is recommended by Center for Disease Control  (CDC) for  adults born from [birth year] through 1963.   Completed  . Pneumonia vaccines  Completed      Hearing Loss Hearing loss is a partial or total loss of the ability to hear. This can be temporary or permanent, and it can happen in one or both ears. Hearing loss may be referred to as deafness. Medical care is necessary to treat hearing loss properly and to prevent the condition from getting worse. Your hearing may partially or completely come back, depending on what caused your hearing loss and how severe it is. In some cases, hearing loss is permanent. What are the causes? Common causes of hearing loss include:  Too much wax in the ear canal.  Infection of the ear canal or middle ear.  Fluid in the middle ear.  Injury to the ear or surrounding area.  An object stuck in the ear.  Prolonged exposure to loud sounds, such as music. Less common causes of hearing loss include:  Tumors in the ear.  Viral or bacterial infections, such as meningitis.  A hole in the eardrum (perforated eardrum).  Problems with the hearing nerve that sends signals between the brain and the  ear.  Certain medicines. What are the signs or symptoms? Symptoms of this condition may include:  Difficulty telling the difference between sounds.  Difficulty following a conversation when there is background noise.  Lack of response to sounds in your environment. This may be most noticeable when you do not respond to startling sounds.  Needing to turn up the volume on the television, radio, etc.  Ringing in the ears.  Dizziness.  Pain in the ears. How is this diagnosed? This condition is diagnosed based on a physical exam and a hearing test (audiometry). The audiometry test will be performed by a hearing specialist (audiologist). You may also be referred to an ear, nose, and throat (ENT) specialist (otolaryngologist). How is this treated? Treatment for recent onset of hearing loss may include:  Ear wax removal.  Being prescribed medicines to prevent infection (antibiotics).  Being prescribed medicines to reduce inflammation (corticosteroids). Follow these instructions at home:  If you were prescribed an antibiotic medicine, take it as told by your health care provider. Do not stop taking the antibiotic even if you start to feel better.  Take over-the-counter and prescription medicines only as told by your health care provider.  Avoid loud noises.  Return to your normal activities as told by your health care provider. Ask your health care provider what  activities are safe for you.  Keep all follow-up visits as told by your health care provider. This is important. Contact a health care provider if:  You feel dizzy.  You develop new symptoms.  You vomit or feel nauseous.  You have a fever. Get help right away if:  You develop sudden changes in your vision.  You have severe ear pain.  You have new or increased weakness.  You have a severe headache. This information is not intended to replace advice given to you by your health care provider. Make sure you  discuss any questions you have with your health care provider. Document Released: 05/12/2005 Document Revised: 10/18/2015 Document Reviewed: 09/27/2014 Elsevier Interactive Patient Education  2017 [employer].

## 2016-08-21 NOTE — Progress Notes (Signed)
Care was provided under my supervision. I agree with the management as indicated in the note.  Anner Baity DO  

## 2016-11-13 ENCOUNTER — Ambulatory Visit: Payer: PPO | Attending: Family Medicine | Admitting: Audiology

## 2016-11-13 DIAGNOSIS — H903 Sensorineural hearing loss, bilateral: Secondary | ICD-10-CM | POA: Insufficient documentation

## 2016-11-13 DIAGNOSIS — H93213 Auditory recruitment, bilateral: Secondary | ICD-10-CM | POA: Diagnosis not present

## 2016-11-13 DIAGNOSIS — H9191 Unspecified hearing loss, right ear: Secondary | ICD-10-CM | POA: Insufficient documentation

## 2016-11-13 DIAGNOSIS — H9193 Unspecified hearing loss, bilateral: Secondary | ICD-10-CM | POA: Diagnosis not present

## 2016-11-13 DIAGNOSIS — H9192 Unspecified hearing loss, left ear: Secondary | ICD-10-CM

## 2016-11-13 NOTE — Procedures (Signed)
[facility] [place]  [place], [place] [address]  [phone]   Audiological Evaluation Patient Name: Kierre Deines  Status: Outpatient   DOB: March 18, 1946    Diagnosis: Hearing Loss                 MRN: [id number] Date:  11/13/2016     Referent: Coral Spikes, DO  History: Margel Joens was seen for an audiological evaluation. 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 She report trouble hearing the telephone-usually uses the cell phone.  Primary Concern: Hearing loss. Pain: None History of hearing problems: Y - has been aware of hearing loss since 2008 following a hearing screen at the company that she worked with. The last year hearing has become worse-now can't hear at church.   History of ear infections:  N - but has "sinus drainage".  History of dizziness/vertigo:   Y - occationally now. About 20 years ago had a severe case of vertigo.  History of balance issues:  Y - unsteady and has "arthritis"  Tinnitus: N Sound sensitivity: N History of occupational noise exposure: Y- worked in Estate manager/land agent, Health and safety inspector, Restaurant manager, fast food and now in Herbalist. History of hypertension: Sometimes, not now.  History of diabetes:  Y- prediabetic last year. Family history of hearing loss: N Other concerns: Had a "biospsy for enlarged thyroid" in 2017 - it came back negative. Has osteoporisis.    Evaluation: Conventional pure tone audiometry from 250Hz  - 8000Hz  with using insert earphones.  Hearing Thresholds shows symmetrical hearing that is sensorineural bilaterally ranging from 45-55 dBHL bilaterally except for 40 dBHL in the left ear at 250Hz  and 60 dBHL in the right ear at 4000Hz . Reliability is good Speech reception levels (repeating words near threshold) using recorded spondee word lists:  Right ear: 45 dBHL.  Left ear:  45 dBHL Word recognition (at comfortably loud volumes) using recorded NU-6 word lists at 85 dBHL, in quiet.  Right ear:  100%.  Left ear:   96% Word recognition in minimal background noise:  +5 dBHL  Right ear: 60%                              Left ear:  76%  Tympanometry shows normal middle ear volume, pressure and compliance (Type A) bilaterally with ipsilateral acoustic reflexes that range from 85-90 dBHL from 500Hz  - 1000Hz , 95 dBHL at 2000Hz  and 100dBHL at 4000Hz .   CONCLUSION:      Marajade Lei has a significant moderate to borderline moderately severe sensorineural hearing loss bilaterally. She has excellent word recognition when presented at very loud levels, equivalent to very loud speech at 1-2 feet distance. In minimal background noise, her word recognition drops to poor on the right side and good on the left side.  Loudness recruitment is evident bilaterally.  Venetia has normal middle ear function bilaterally. This amount of hearing loss will adversely affect speech communication at normal conversational speech levels and Abbigayle should benefit from amplification; therefore a hearing aid evaluation is recommended. The test results were discussed and Marian Sorrow counseled.  RECOMMENDATIONS: 1.   Since [place] may qualify for one free state assisted hearing aid through the EDS hearing aid program. An appointment was made at an EDS participating provider in [place], [place] [address], [place], [place], [place] [address] ([place] [phone]) for July 25,2018 at 10:30am.  2.  Monitor hearing closely with a repeat audiological evaluation in 6 months (earlier if there is any change in hearing or ear pressure) since there are concerns that Ethell's hearing has changed over this past year. This appointment may be scheduled here or at [facility]. 3.  Strategies that help improve hearing include: A) Face the speaker directly. Optimal is having the speakers face well - lit.  Unless amplified, being within 3-6 feet of the speaker will enhance word recognition. B) Avoid having the  speaker back-lit as this will minimize the ability to use cues from lip-reading, facial expression and gestures. C)  Word recognition is poorer in background noise. For optimal word recognition, turn off the TV, radio or noisy fan when engaging in conversation. In a restaurant, try to sit away from noise sources and close to the primary speaker.  D)  Ask for topic clarification from time to time in order to remain in the conversation.  Most people don't mind repeating or clarifying a point when asked.  If needed, explain the difficulty hearing in background noise or hearing loss.  Chalyn Amescua L. Heide Spark, Au.D., CCC-A Doctor of Audiology 11/13/2016  cc: Coral Spikes, DO

## 2016-11-21 ENCOUNTER — Other Ambulatory Visit: Payer: Self-pay | Admitting: Family Medicine

## 2017-01-02 DIAGNOSIS — H903 Sensorineural hearing loss, bilateral: Secondary | ICD-10-CM | POA: Diagnosis not present

## 2017-02-05 ENCOUNTER — Ambulatory Visit (INDEPENDENT_AMBULATORY_CARE_PROVIDER_SITE_OTHER): Payer: PPO | Admitting: Family Medicine

## 2017-02-05 ENCOUNTER — Encounter: Payer: Self-pay | Admitting: Family Medicine

## 2017-02-05 VITALS — BP 130/90 | HR 61 | Temp 97.6°F | Resp 16 | Wt 202.0 lb

## 2017-02-05 DIAGNOSIS — R7303 Prediabetes: Secondary | ICD-10-CM | POA: Diagnosis not present

## 2017-02-05 DIAGNOSIS — E042 Nontoxic multinodular goiter: Secondary | ICD-10-CM

## 2017-02-05 DIAGNOSIS — K219 Gastro-esophageal reflux disease without esophagitis: Secondary | ICD-10-CM

## 2017-02-05 DIAGNOSIS — Z1231 Encounter for screening mammogram for malignant neoplasm of breast: Secondary | ICD-10-CM

## 2017-02-05 DIAGNOSIS — Z130 Encounter for screening for diseases of the blood and blood-forming organs and certain disorders involving the immune mechanism: Secondary | ICD-10-CM | POA: Diagnosis not present

## 2017-02-05 DIAGNOSIS — M1990 Unspecified osteoarthritis, unspecified site: Secondary | ICD-10-CM

## 2017-02-05 DIAGNOSIS — E785 Hyperlipidemia, unspecified: Secondary | ICD-10-CM | POA: Insufficient documentation

## 2017-02-05 DIAGNOSIS — M810 Age-related osteoporosis without current pathological fracture: Secondary | ICD-10-CM

## 2017-02-05 DIAGNOSIS — E7800 Pure hypercholesterolemia, unspecified: Secondary | ICD-10-CM | POA: Diagnosis not present

## 2017-02-05 DIAGNOSIS — Z1239 Encounter for other screening for malignant neoplasm of breast: Secondary | ICD-10-CM

## 2017-02-05 DIAGNOSIS — I10 Essential (primary) hypertension: Secondary | ICD-10-CM | POA: Diagnosis not present

## 2017-02-05 LAB — COMPREHENSIVE METABOLIC PANEL
ALBUMIN: 4.1 g/dL (ref 3.5–5.2)
ALT: 22 U/L (ref 0–35)
AST: 24 U/L (ref 0–37)
Alkaline Phosphatase: 58 U/L (ref 39–117)
BUN: 20 mg/dL (ref 6–23)
CALCIUM: 9.5 mg/dL (ref 8.4–10.5)
CHLORIDE: 102 meq/L (ref 96–112)
CO2: 29 meq/L (ref 19–32)
Creatinine, Ser: 0.96 mg/dL (ref 0.40–1.20)
GFR: 61.15 mL/min (ref >60.00)
Glucose, Bld: 100 mg/dL — ABNORMAL HIGH (ref 70–99)
POTASSIUM: 4.1 meq/L (ref 3.5–5.1)
Sodium: 136 mEq/L (ref 135–145)
Total Bilirubin: 0.5 mg/dL (ref 0.2–1.2)
Total Protein: 7.1 g/dL (ref 6.0–8.3)

## 2017-02-05 LAB — CBC
HEMATOCRIT: 36 % (ref 36.0–46.0)
HEMOGLOBIN: 11.6 g/dL — AB (ref 12.0–15.0)
MCHC: 32.3 g/dL (ref 30.0–36.0)
MCV: 88.3 fl (ref 78.0–100.0)
Platelets: 217 10*3/uL (ref 150.0–400.0)
RBC: 4.08 Mil/uL (ref 3.87–5.11)
RDW: 14.3 % (ref 11.5–15.5)
WBC: 5.5 10*3/uL (ref 4.0–10.5)

## 2017-02-05 LAB — LIPID PANEL
CHOL/HDL RATIO: 2
CHOLESTEROL: 164 mg/dL (ref 0–200)
HDL: 73.9 mg/dL (ref >39.00)
LDL CALC: 81 mg/dL (ref 0–99)
NonHDL: 90.39
Triglycerides: 49 mg/dL (ref 0.0–149.0)
VLDL: 9.8 mg/dL (ref 0.0–40.0)

## 2017-02-05 LAB — HEMOGLOBIN A1C: Hgb A1c MFr Bld: 6 % (ref 4.6–6.5)

## 2017-02-05 LAB — TSH: TSH: 1.76 u[IU]/mL (ref 0.35–4.50)

## 2017-02-05 MED ORDER — OMEPRAZOLE 40 MG PO CPDR: 40.0000 mg | DELAYED_RELEASE_CAPSULE | Freq: Every day | ORAL | 3 refills | Status: DC

## 2017-02-05 MED ORDER — DICLOFENAC SODIUM 75 MG PO TBEC: 75.0000 mg | DELAYED_RELEASE_TABLET | Freq: Two times a day (BID) | ORAL | 1 refills | Status: DC | PRN

## 2017-02-05 NOTE — Assessment & Plan Note (Signed)
Results reviewed with the patient. Patient not at high risk. Patient elects to wait on proceeding with [place].

## 2017-02-05 NOTE — Assessment & Plan Note (Signed)
Stable.       - Continue to monitor

## 2017-02-05 NOTE — Assessment & Plan Note (Signed)
Stable. Refilled diclofenac. She uses as needed.

## 2017-02-05 NOTE — Progress Notes (Addendum)
Subjective:  Patient ID: Madison Craig, female    DOB: Oct 19, 1945  Age: 71 y.o. MRN: [id number]  CC: Follow up  HPI:  71 year old female with HTN, GERD, Osteoporosis, Goiter, Prediabetes, HLD presents for follow up.  HTN  Stable. Patient declined medication previously.  GERD  Stable Prilosec. Needs refill.  Osteoarthritis  Stable on Diclofenac. Needs refill.  Goiter  Prior US recommend follow up if at high risk.  Patient concerned and wants to discuss today.  Osteoporosis  Needs treatment.  Has not started Evista.  Social Hx   Social History   Social History  . Marital status: Widowed    Spouse name: N/A  . Number of children: 3  . Years of education: N/A   Social History Main Topics  . Smoking status: Former Smoker    Packs/day: 2.00    Years: 16.00    Types: Cigarettes    Quit date: 09/29/1978  . Smokeless tobacco: Never Used  . Alcohol use No  . Drug use: No  . Sexual activity: No   Other Topics Concern  . None   Social History Narrative  . None    Review of Systems  Constitutional: Negative.   Musculoskeletal: Positive for arthralgias and joint swelling.   Objective:  BP 130/90 (BP Location: Left Arm, Patient Position: Sitting, Cuff Size: Normal)   Pulse 61   Temp 97.6 F (36.4 C) (Oral)   Resp 16   Wt 202 lb (91.6 kg)   SpO2 94%   BMI 38.17 kg/m   BP/Weight 02/05/2017 6/43/3295 06/02/8414  Systolic BP 606 301 601  Diastolic BP 90 78 72  Wt. (Lbs) 202 194.12 196.8  BMI 38.17 36.68 36.88   Physical Exam  Constitutional: She is oriented to person, place, and time. She appears well-developed. No distress.  Cardiovascular: Normal rate and regular rhythm.   Pulmonary/Chest: Effort normal. She has no wheezes. She has no rales.  Neurological: She is alert and oriented to person, place, and time.  Psychiatric: She has a normal mood and affect.  Vitals reviewed.   Lab Results  Component Value Date   WBC 6.4 12/03/2015   HGB  12.2 12/03/2015   HCT 37.0 12/03/2015   PLT 225.0 12/03/2015   GLUCOSE 90 12/03/2015   CHOL 175 12/03/2015   TRIG 82.0 12/03/2015   HDL 55.50 12/03/2015   LDLCALC 103 (H) 12/03/2015   ALT 21 12/03/2015   AST 22 12/03/2015   NA 137 12/03/2015   K 4.2 12/03/2015   CL 101 12/03/2015   CREATININE 0.84 12/03/2015   BUN 14 12/03/2015   CO2 30 12/03/2015   TSH 1.767 01/11/2014   HGBA1C 5.9 12/03/2015    Assessment & Plan:   Problem List Items Addressed This Visit    Essential hypertension - Primary    Stable. Continue to monitor.      Relevant Orders   Comprehensive metabolic panel   GERD (gastroesophageal reflux disease)    Stable on Prilosec. Refilled today.      Relevant Medications   omeprazole (PRILOSEC) 40 MG capsule   Hyperlipidemia   Relevant Orders   Lipid panel   Multinodular goiter    Results reviewed with the patient. Patient not at high risk. Patient elects to wait on proceeding with [place].      Relevant Orders   TSH   Osteoarthritis    Stable. Refilled diclofenac. She uses as needed.      Relevant Medications   diclofenac (VOLTAREN)  75 MG EC tablet   Osteoporosis    Advised to start Evista.      Prediabetes   Relevant Orders   Hemoglobin A1c    Other Visit Diagnoses    Screening for deficiency anemia       Relevant Orders   CBC   Breast cancer screening       Relevant Orders   MM Digital Screening      Meds ordered this encounter  Medications  . diclofenac (VOLTAREN) 75 MG EC tablet    Sig: Take 1 tablet (75 mg total) by mouth 2 (two) times daily as needed.    Dispense:  60 tablet    Refill:  1    Please consider 90 day supplies to promote better adherence  . omeprazole (PRILOSEC) 40 MG capsule    Sig: Take 1 capsule (40 mg total) by mouth daily.    Dispense:  90 capsule    Refill:  3   Follow-up: Return in about 6 months (around 08/05/2017).  [place]

## 2017-02-05 NOTE — Assessment & Plan Note (Signed)
Stable on Prilosec. Refilled today.

## 2017-02-05 NOTE — Patient Instructions (Addendum)
Continue your medications.  Follow up in 6 months.  Take care  Dr. Minoru Chap  

## 2017-02-05 NOTE — Assessment & Plan Note (Signed)
Advised to start Evista.

## 2017-02-10 ENCOUNTER — Telehealth: Payer: Self-pay | Admitting: Family Medicine

## 2017-02-10 NOTE — Telephone Encounter (Signed)
Pt called back returning your call. Pt asked to call [phone] before 12. Please advise, thank you!

## 2017-02-10 NOTE — Telephone Encounter (Signed)
Leftvoicemail to call  

## 2017-02-11 NOTE — Telephone Encounter (Signed)
Returned patients call in regards to lab results

## 2017-03-11 ENCOUNTER — Telehealth: Payer: Self-pay | Admitting: *Deleted

## 2017-03-11 DIAGNOSIS — D649 Anemia, unspecified: Secondary | ICD-10-CM

## 2017-03-11 NOTE — Telephone Encounter (Signed)
Ordered

## 2017-03-11 NOTE — Telephone Encounter (Signed)
Former Madison Craig pt has lab app on 03/13/17. Needs future labs ordered. Per lab note on 02/05/17: Mild anemia noted. Labs otherwise normal. Repeat CBC (and with further studies - iron, etc) in 1 month.  Note: Pt has an appt to est care with you in March 2019

## 2017-03-13 ENCOUNTER — Other Ambulatory Visit (INDEPENDENT_AMBULATORY_CARE_PROVIDER_SITE_OTHER): Payer: PPO

## 2017-03-13 DIAGNOSIS — D649 Anemia, unspecified: Secondary | ICD-10-CM | POA: Diagnosis not present

## 2017-03-13 LAB — CBC
HCT: 33.7 % — ABNORMAL LOW (ref 35.0–45.0)
HEMOGLOBIN: 11.2 g/dL — AB (ref 11.7–15.5)
MCH: 28.7 pg (ref 27.0–33.0)
MCHC: 33.2 g/dL (ref 32.0–36.0)
MCV: 86.4 fL (ref 80.0–100.0)
MPV: 10.3 fL (ref 7.5–12.5)
Platelets: 241 10*3/uL (ref 140–400)
RBC: 3.9 10*6/uL (ref 3.80–5.10)
RDW: 13.6 % (ref 11.0–15.0)
WBC: 6.1 10*3/uL (ref 3.8–10.8)

## 2017-03-13 LAB — IRON,TIBC AND FERRITIN PANEL
%SAT: 11 % (calc) (ref 11–50)
FERRITIN: 8 ng/mL — AB (ref 20–288)
Iron: 45 ug/dL (ref 45–160)
TIBC: 416 mcg/dL (calc) (ref 250–450)

## 2017-03-13 NOTE — Addendum Note (Signed)
Addended by: Arby Barrette on: 03/13/2017 10:00 AM   Modules accepted: Orders

## 2017-03-18 ENCOUNTER — Other Ambulatory Visit: Payer: Self-pay | Admitting: Family Medicine

## 2017-03-18 DIAGNOSIS — D509 Iron deficiency anemia, unspecified: Secondary | ICD-10-CM

## 2017-03-24 ENCOUNTER — Other Ambulatory Visit (INDEPENDENT_AMBULATORY_CARE_PROVIDER_SITE_OTHER): Payer: PPO

## 2017-03-24 DIAGNOSIS — D509 Iron deficiency anemia, unspecified: Secondary | ICD-10-CM | POA: Diagnosis not present

## 2017-03-24 LAB — POCT URINALYSIS DIPSTICK
Bilirubin, UA: NEGATIVE
GLUCOSE UA: NEGATIVE
Ketones, UA: NEGATIVE
NITRITE UA: NEGATIVE
PROTEIN UA: NEGATIVE
RBC UA: NEGATIVE
Spec Grav, UA: 1.01 (ref 1.010–1.025)
UROBILINOGEN UA: 1 U/dL
pH, UA: 6 (ref 5.0–8.0)

## 2017-03-25 ENCOUNTER — Encounter: Payer: Self-pay | Admitting: Family Medicine

## 2017-03-30 ENCOUNTER — Other Ambulatory Visit (INDEPENDENT_AMBULATORY_CARE_PROVIDER_SITE_OTHER): Payer: PPO

## 2017-03-30 ENCOUNTER — Other Ambulatory Visit: Payer: Self-pay | Admitting: Family Medicine

## 2017-03-30 DIAGNOSIS — D509 Iron deficiency anemia, unspecified: Secondary | ICD-10-CM | POA: Diagnosis not present

## 2017-03-30 DIAGNOSIS — R195 Other fecal abnormalities: Secondary | ICD-10-CM

## 2017-03-30 LAB — FECAL OCCULT BLOOD, IMMUNOCHEMICAL: FECAL OCCULT BLD: POSITIVE — AB

## 2017-03-31 ENCOUNTER — Telehealth: Payer: Self-pay | Admitting: Family Medicine

## 2017-03-31 NOTE — Telephone Encounter (Signed)
Lab result given to pt.   That there her stool was positive for blood.   A referral made to GI.   She prefers to go to [facility].  No doctor preference.

## 2017-03-31 NOTE — Telephone Encounter (Signed)
I will forward to Melissa to get her set up in [facility] for GI evaluation.

## 2017-04-09 ENCOUNTER — Encounter: Payer: Self-pay | Admitting: Gastroenterology

## 2017-04-09 ENCOUNTER — Other Ambulatory Visit: Payer: Self-pay

## 2017-04-09 ENCOUNTER — Encounter (INDEPENDENT_AMBULATORY_CARE_PROVIDER_SITE_OTHER): Payer: Self-pay

## 2017-04-09 ENCOUNTER — Ambulatory Visit (INDEPENDENT_AMBULATORY_CARE_PROVIDER_SITE_OTHER): Payer: PPO | Admitting: Gastroenterology

## 2017-04-09 VITALS — BP 144/76 | HR 77 | Temp 98.1°F | Wt 201.0 lb

## 2017-04-09 DIAGNOSIS — R195 Other fecal abnormalities: Secondary | ICD-10-CM | POA: Diagnosis not present

## 2017-04-09 DIAGNOSIS — D500 Iron deficiency anemia secondary to blood loss (chronic): Secondary | ICD-10-CM

## 2017-04-09 NOTE — Progress Notes (Signed)
Madison Antigua, MD [address], [place], [place], [place], [address] [address], [place], [place], [place], [address] Phone: [phone]  Fax: [phone]  Consultation  Referring Provider:     Leone Haven, MD Primary Care Physician:  Leone Haven, MD Primary Gastroenterologist:  Virgel Manifold, MD        Reason for Consultation:     FOBT positive Date of Consultation:  04/09/2017         HPI:   Madison Craig is a 71 y.o. female who is referred from her primary care doctor due to positive FOBT.  This was done due to drop in hemoglobin noted by PCP.  Hemoglobin was 11.23 weeks ago was 12.21-year ago.  MCV is normal at 86.  Ferritin is low at 8.  Patient is not on any iron replacement.  Iron is 45.  Patient denies seeing any bright red blood per rectum or melena.  Denies any abdominal pain.  Documented weights do not show any abnormal weight loss.  Patient is 202 pounds today and was 206-207 lb 1 year ago.  Denies any nausea vomiting.  Denies any dysphagia.  Takes diclofenac every day.  Is also on omeprazole every day for acid reflux as she states she has a lot of heartburn without it.  Last colonoscopy was in 2014 for polyp surveillance did not show any polyps.  Diverticulosis was seen.  Repeat was recommended in 5 years.  Prep was fair.  Colonoscopy in 2007 showed 2 polyps.  EGD in 2007 showed hiatal hernia and grade a esophagitis.  Patient was also noted to have a ferritin of 8 3 years ago.  She says she was previously on iron replacement but has not been on it in a while.  A UA was done recently and did not show any microscopic hematuria.  Patient denies noting any gross blood in her urine.  Denies any vaginal bleeding.  Denies any epistaxis  Past Medical History:  Diagnosis Date  . Anemia   . Arthritis   . Dyspnea 09/28/2013   Pulmonary function testing done on 02/09/2014 showed minimal airway obstruction with a lack of response to bronchodilators; her  FEV1 was normal, the FEV1/FVC ratio and the FEF 25-75% were reduced; the airway resistance was normal; lung volumes were within normal limits; the diffusing capacity was high for the measured volumes.    Marland Kitchen GERD (gastroesophageal reflux disease)   . Macular degeneration 01/11/2014   Followed at [facility] in [place], [place].   . Multiple thyroid nodules    Thyroid ultrasound on 01/25/2014 showed a multinodular goiter, with a 1.5 cm nodule in the inferior left thyroid lobe that met the criteria for ultrasound-guided biopsy.  On 02/06/2014 an ultrasound guided needle aspirate biopsy was performed of the left inferior thyroid nodule; cytopathology findings were consistent with non-neoplastic goiter.     . Osteoporosis 09/18/2014   DXA 08/16/2014 showed osteopenia of right femur neck (T-score -2.3) and osteoporosis of left forearm radius (T-score -2.9).  Lumbar spine was not utilized due to advanced degenerative changes.   Marland Kitchen SLEEP APNEA 03/22/2009   Had sleep study in [place] about 10 years, has BiPAP, cannot sleep with it on.      Past Surgical History:  Procedure Laterality Date  . BREAST SURGERY  1988   breast biopsy    Prior to Admission medications   Medication Sig Start Date End Date Taking? Authorizing Provider  diclofenac (VOLTAREN) 75 MG EC tablet  Take 1 tablet (75 mg total) by mouth 2 (two) times daily as needed. 02/05/17  Yes Cook, Jayce G, DO  omeprazole (PRILOSEC) 40 MG capsule Take 1 capsule (40 mg total) by mouth daily. 02/05/17  Yes Cook, Barnie Del, DO  raloxifene (EVISTA) 60 MG tablet Take 1 tablet (60 mg total) by mouth daily. Patient not taking: Reported on 02/05/2017 07/24/16   Coral Spikes, DO    Family History  Problem Relation Age of Onset  . Heart disease Father 52  . Sudden death Father   . Hypertension Father   . Hyperlipidemia Father   . Heart attack Father   . Diabetes Son 52  . Stroke Mother   . Hypertension Mother   . Dementia Mother   . Stroke Sister 63  .  Alcohol abuse Sister   . Colon cancer Neg Hx   . Breast cancer Neg Hx      Social History   Tobacco Use  . Smoking status: Former Smoker    Packs/day: 2.00    Years: 16.00    Pack years: 32.00    Types: Cigarettes    Last attempt to quit: 09/29/1978    Years since quitting: 38.5  . Smokeless tobacco: Never Used  Substance Use Topics  . Alcohol use: No  . Drug use: No    Allergies as of 04/09/2017  . (No Known Allergies)    Review of Systems:    All systems reviewed and negative except where noted in HPI.   Physical Exam:  Vital signs in last 24 hours: Vital signs reviewed   General:   Pleasant, cooperative in NAD Head:  Normocephalic and atraumatic. Eyes:   No icterus.   Conjunctiva pink. PERRLA. Ears:  Normal auditory acuity. Neck:  Supple; no masses or thyroidomegaly Lungs: Respirations even and unlabored. Lungs clear to auscultation bilaterally.   No wheezes, crackles, or rhonchi.  Heart:  Regular rate and rhythm;  Without murmur, clicks, rubs or gallops Abdomen:  Soft, nondistended, nontender. Normal bowel sounds. No appreciable masses or hepatomegaly.  No rebound or guarding.  Neurologic:  Alert and oriented x3;  grossly normal neurologically. Skin:  Intact without significant lesions or rashes. Cervical Nodes:  No significant cervical adenopathy. Psych:  Alert and cooperative. Normal affect.  LAB RESULTS: No results for input(s): WBC, HGB, HCT, PLT in the last 72 hours. BMET No results for input(s): NA, K, CL, CO2, GLUCOSE, BUN, CREATININE, CALCIUM in the last 72 hours. LFT No results for input(s): PROT, ALBUMIN, AST, ALT, ALKPHOS, BILITOT, BILIDIR, IBILI in the last 72 hours. PT/INR No results for input(s): LABPROT, INR in the last 72 hours.   October 2018 labs reviewed.  Hemoglobin 11.2, MCV 86.4, ferritin 8  STUDIES: No results found. EGD and colonoscopy reports and provation from 2014 and 2007 reviewed   Impression / Plan:   Madison Craig  is a 71 y.o. y/o female with positive FOBT and iron deficiency anemia  We will schedule for colonoscopy and EGD due to iron deficiency I have discussed alternative options, risks & benefits,  which include, but are not limited to, bleeding, infection, perforation,respiratory complication & drug reaction.  The patient agrees with this plan & written consent will be obtained.   If these are negative can consider small bowel capsule   since patient is on a colonoscopy surveillance schedule, if her colonoscopy is negative, patient does not need future stool CRC screening for at least 5 years per guidelines. This was discussed  with the pt as well.  Patient is on diclofenac which can predispose to peptic ulcer disease however she is also on omeprazole for acid reflux which would also be protective for peptic ulcer disease.  Risks of long-term PPI explained to patient including infections, C. difficile, pneumonia, chronic kidney disease, bone loss, electrolyte abnormalities.  Thank you for involving me in the care of this patient.      Virgel Manifold, MD  04/09/2017, 11:22 AM

## 2017-04-10 ENCOUNTER — Other Ambulatory Visit: Payer: Self-pay

## 2017-04-13 ENCOUNTER — Other Ambulatory Visit: Payer: Self-pay

## 2017-04-15 ENCOUNTER — Ambulatory Visit
Admission: RE | Admit: 2017-04-15 | Discharge: 2017-04-15 | Disposition: A | Discharge status: Home / Self Care | Payer: PPO | Source: Ambulatory Visit | Attending: Family Medicine | Admitting: Family Medicine

## 2017-04-15 DIAGNOSIS — Z1231 Encounter for screening mammogram for malignant neoplasm of breast: Secondary | ICD-10-CM | POA: Insufficient documentation

## 2017-04-24 ENCOUNTER — Encounter: Payer: Self-pay | Admitting: *Deleted

## 2017-04-24 ENCOUNTER — Ambulatory Visit: Payer: PPO | Admitting: Anesthesiology

## 2017-04-24 ENCOUNTER — Ambulatory Visit
Admission: RE | Admit: 2017-04-24 | Discharge: 2017-04-24 | Disposition: A | Discharge status: Home / Self Care | Payer: PPO | Source: Ambulatory Visit | Attending: Gastroenterology | Admitting: Gastroenterology

## 2017-04-24 ENCOUNTER — Encounter
Admission: RE | Disposition: A | Discharge status: Home / Self Care | Payer: Self-pay | Source: Ambulatory Visit | Attending: Gastroenterology

## 2017-04-24 DIAGNOSIS — M1990 Unspecified osteoarthritis, unspecified site: Secondary | ICD-10-CM | POA: Insufficient documentation

## 2017-04-24 DIAGNOSIS — K5790 Diverticulosis of intestine, part unspecified, without perforation or abscess without bleeding: Secondary | ICD-10-CM | POA: Diagnosis not present

## 2017-04-24 DIAGNOSIS — J45909 Unspecified asthma, uncomplicated: Secondary | ICD-10-CM | POA: Insufficient documentation

## 2017-04-24 DIAGNOSIS — G4730 Sleep apnea, unspecified: Secondary | ICD-10-CM | POA: Diagnosis not present

## 2017-04-24 DIAGNOSIS — I10 Essential (primary) hypertension: Secondary | ICD-10-CM | POA: Insufficient documentation

## 2017-04-24 DIAGNOSIS — D122 Benign neoplasm of ascending colon: Secondary | ICD-10-CM | POA: Diagnosis not present

## 2017-04-24 DIAGNOSIS — K5730 Diverticulosis of large intestine without perforation or abscess without bleeding: Secondary | ICD-10-CM | POA: Insufficient documentation

## 2017-04-24 DIAGNOSIS — K2100 Gastro-esophageal reflux disease with esophagitis, without bleeding: Secondary | ICD-10-CM

## 2017-04-24 DIAGNOSIS — K449 Diaphragmatic hernia without obstruction or gangrene: Secondary | ICD-10-CM | POA: Diagnosis not present

## 2017-04-24 DIAGNOSIS — D509 Iron deficiency anemia, unspecified: Secondary | ICD-10-CM | POA: Diagnosis not present

## 2017-04-24 DIAGNOSIS — R195 Other fecal abnormalities: Secondary | ICD-10-CM

## 2017-04-24 DIAGNOSIS — K209 Esophagitis, unspecified: Secondary | ICD-10-CM | POA: Diagnosis not present

## 2017-04-24 DIAGNOSIS — H3530 Unspecified macular degeneration: Secondary | ICD-10-CM | POA: Diagnosis not present

## 2017-04-24 DIAGNOSIS — D500 Iron deficiency anemia secondary to blood loss (chronic): Secondary | ICD-10-CM | POA: Diagnosis not present

## 2017-04-24 DIAGNOSIS — Z87891 Personal history of nicotine dependence: Secondary | ICD-10-CM | POA: Diagnosis not present

## 2017-04-24 DIAGNOSIS — K2950 Unspecified chronic gastritis without bleeding: Secondary | ICD-10-CM | POA: Insufficient documentation

## 2017-04-24 DIAGNOSIS — Z8249 Family history of ischemic heart disease and other diseases of the circulatory system: Secondary | ICD-10-CM | POA: Insufficient documentation

## 2017-04-24 DIAGNOSIS — K2970 Gastritis, unspecified, without bleeding: Secondary | ICD-10-CM | POA: Diagnosis not present

## 2017-04-24 DIAGNOSIS — K635 Polyp of colon: Secondary | ICD-10-CM | POA: Diagnosis not present

## 2017-04-24 DIAGNOSIS — D125 Benign neoplasm of sigmoid colon: Secondary | ICD-10-CM

## 2017-04-24 DIAGNOSIS — E669 Obesity, unspecified: Secondary | ICD-10-CM | POA: Diagnosis not present

## 2017-04-24 DIAGNOSIS — M810 Age-related osteoporosis without current pathological fracture: Secondary | ICD-10-CM | POA: Insufficient documentation

## 2017-04-24 DIAGNOSIS — Z79899 Other long term (current) drug therapy: Secondary | ICD-10-CM | POA: Diagnosis not present

## 2017-04-24 DIAGNOSIS — Z6836 Body mass index (BMI) 36.0-36.9, adult: Secondary | ICD-10-CM | POA: Diagnosis not present

## 2017-04-24 DIAGNOSIS — K2960 Other gastritis without bleeding: Secondary | ICD-10-CM | POA: Diagnosis not present

## 2017-04-24 DIAGNOSIS — K3189 Other diseases of stomach and duodenum: Secondary | ICD-10-CM

## 2017-04-24 DIAGNOSIS — D126 Benign neoplasm of colon, unspecified: Secondary | ICD-10-CM | POA: Diagnosis not present

## 2017-04-24 HISTORY — PX: ESOPHAGOGASTRODUODENOSCOPY (EGD) WITH PROPOFOL: SHX5813

## 2017-04-24 HISTORY — PX: COLONOSCOPY WITH PROPOFOL: SHX5780

## 2017-04-24 SURGERY — COLONOSCOPY WITH PROPOFOL
Anesthesia: General

## 2017-04-24 MED ORDER — PROPOFOL 10 MG/ML IV BOLUS
INTRAVENOUS | Status: AC
  Filled 2017-04-24: qty 20

## 2017-04-24 MED ORDER — PROPOFOL 500 MG/50ML IV EMUL
INTRAVENOUS | Status: AC
  Filled 2017-04-24: qty 50

## 2017-04-24 MED ORDER — FENTANYL CITRATE (PF) 100 MCG/2ML IJ SOLN
INTRAMUSCULAR | Status: DC | PRN
  Administered 2017-04-24 (×2): 50 ug via INTRAVENOUS

## 2017-04-24 MED ORDER — PROPOFOL 10 MG/ML IV BOLUS
INTRAVENOUS | Status: DC | PRN
  Administered 2017-04-24: 40 mg via INTRAVENOUS
  Administered 2017-04-24: 100 mg via INTRAVENOUS

## 2017-04-24 MED ORDER — FENTANYL CITRATE (PF) 100 MCG/2ML IJ SOLN
INTRAMUSCULAR | Status: AC
  Filled 2017-04-24: qty 2

## 2017-04-24 MED ORDER — PROPOFOL 500 MG/50ML IV EMUL
INTRAVENOUS | Status: DC | PRN
  Administered 2017-04-24: 140 ug/kg/min via INTRAVENOUS

## 2017-04-24 MED ORDER — LIDOCAINE HCL (PF) 2 % IJ SOLN
INTRAMUSCULAR | Status: AC
  Filled 2017-04-24: qty 10

## 2017-04-24 MED ORDER — SODIUM CHLORIDE 0.9 % IV SOLN
INTRAVENOUS | Status: DC
  Administered 2017-04-24: 1000 mL via INTRAVENOUS

## 2017-04-24 MED ORDER — LIDOCAINE 2% (20 MG/ML) 5 ML SYRINGE
INTRAMUSCULAR | Status: DC | PRN
  Administered 2017-04-24: 40 mg via INTRAVENOUS

## 2017-04-24 NOTE — Op Note (Addendum)
[facility] Gastroenterology Patient Name: Madison Craig Procedure Date: 04/24/2017 1:19 PM MRN: [id number] Account #: [id number] Date of Birth: 08-12-1945 Admit Type: Outpatient Age: 71 Room: [facility] ENDO ROOM 4 Gender: Female Note Status: Finalized Procedure:            Upper GI endoscopy Indications:          Iron deficiency anemia, Heme positive stool Providers:            Holland Kotter B. Bonna Gains MD, MD Referring MD:         Angela Adam. Caryl Bis (Referring MD) Medicines:            Monitored Anesthesia Care Complications:        No immediate complications. Procedure:            Pre-Anesthesia Assessment:                       - Prior to the procedure, a History and Physical was                        performed, and patient medications, allergies and                        sensitivities were reviewed. The patient's tolerance of                        previous anesthesia was reviewed.                       - The risks and benefits of the procedure and the                        sedation options and risks were discussed with the                        patient. All questions were answered and informed                        consent was obtained.                       - Patient identification and proposed procedure were                        verified prior to the procedure by the physician, the                        nurse, the anesthesiologist, the anesthetist and the                        technician. The procedure was verified in the procedure                        room.                       - ASA Grade Assessment: II - A patient with mild                        systemic disease.  After obtaining informed consent, the endoscope was                        passed under direct vision. Throughout the procedure,                        the patient's blood pressure, pulse, and oxygen                        saturations were monitored continuously. The  Endoscope                        was introduced through the mouth, and advanced to the                        second part of duodenum. The upper GI endoscopy was                        accomplished with ease. The patient tolerated the                        procedure well. Findings:      LA Grade A (one or more mucosal breaks less than 5 mm, not extending       between tops of 2 mucosal folds) esophagitis with no bleeding was found.      Patchy mildly erythematous mucosa without bleeding was found in the       gastric antrum. Biopsies were taken with a cold forceps for histology.      A 1 cm hiatal hernia was present.      Patchy mild mucosal changes characterized by scalloping were found in       the second portion of the duodenum. Biopsies for histology were taken       with a cold forceps for evaluation of celiac disease. Impression:           - LA Grade A reflux esophagitis.                       - Erythematous mucosa in the antrum. Biopsied.                       - 1 cm hiatal hernia.                       - Mucosal changes in the duodenum. Biopsied. Recommendation:       - Await pathology results.                       - Pt. is on PPI and was recommended to continue this                        due to the esophagitis and symptoms without it. Her and                        her family were also educated on antireflux measures                       - Discharge patient to home (with escort).                       -  Advance diet as tolerated.                       - Continue present medications.                       - Patient has a contact number available for                        emergencies. The signs and symptoms of potential                        delayed complications were discussed with the patient.                        Return to normal activities tomorrow. Written discharge                        instructions were provided to the patient.                       - Discharge  patient to home (with escort).                       - The findings and recommendations were discussed with                        the patient.                       - The findings and recommendations were discussed with                        the patient's family. Procedure Code(s):    --- Professional ---                       [phone], Esophagogastroduodenoscopy, flexible, transoral;                        with biopsy, single or multiple Diagnosis Code(s):    --- Professional ---                       K31.89, Other diseases of stomach and duodenum                       K21.0, Gastro-esophageal reflux disease with esophagitis                       D50.9, Iron deficiency anemia, unspecified                       R19.5, Other fecal abnormalities CPT copyright 2016 American Medical Association. All rights reserved. The codes documented in this report are preliminary and upon coder review may  be revised to meet current compliance requirements.  Vonda Antigua, MD Margretta Sidle B. Bonna Gains MD, MD 04/24/2017 1:43:49 PM This report has been signed electronically. Number of Addenda: 0 Note Initiated On: 04/24/2017 1:19 PM      [facility]

## 2017-04-24 NOTE — Op Note (Addendum)
[facility] Gastroenterology Patient Name: Madison Craig Procedure Date: 04/24/2017 1:19 PM MRN: [id number] Account #: [id number] Date of Birth: Aug 05, 1945 Admit Type: Outpatient Age: 71 Room: [facility] ENDO ROOM 4 Gender: Female Note Status: Finalized Procedure:            Colonoscopy Indications:          Heme positive stool, Iron deficiency anemia Providers:            Aleighya Mcanelly B. Bonna Gains MD, MD Referring MD:         Angela Adam. Caryl Bis (Referring MD) Medicines:            Monitored Anesthesia Care Complications:        No immediate complications. Procedure:            Pre-Anesthesia Assessment:                       - ASA Grade Assessment: II - A patient with mild                        systemic disease.                       - Prior to the procedure, a History and Physical was                        performed, and patient medications, allergies and                        sensitivities were reviewed. The patient's tolerance of                        previous anesthesia was reviewed.                       - The risks and benefits of the procedure and the                        sedation options and risks were discussed with the                        patient. All questions were answered and informed                        consent was obtained.                       - Patient identification and proposed procedure were                        verified prior to the procedure by the physician, the                        nurse, the anesthesiologist, the anesthetist and the                        technician. The procedure was verified in the procedure                        room.  After obtaining informed consent, the colonoscope was                        passed under direct vision. Throughout the procedure,                        the patient's blood pressure, pulse, and oxygen                        saturations were monitored continuously. The                      Colonoscope was introduced through the anus and                        advanced to the the cecum, identified by appendiceal                        orifice and ileocecal valve. The colonoscopy was                        performed with ease. The patient tolerated the                        procedure well. The quality of the bowel preparation                        was fair, and extensive cleaning had to be done with                        water and suctioning. Findings:      The perianal and digital rectal examinations were normal.      A 4 mm polyp was found in the sigmoid colon. The polyp was sessile. The       polyp was removed with a cold biopsy forceps. Resection and retrieval       were complete.      A 2 to 3 mm polyp was found in the ascending colon. The polyp was       sessile. The polyp was removed with a cold biopsy forceps. Resection and       retrieval were complete.      Multiple small and large-mouthed diverticula were found in the sigmoid       colon.      The exam was otherwise without abnormality.      The retroflexed view of the distal rectum and anal verge was normal and       showed no anal or rectal abnormalities.      Due to the stool present in the colon, small or flat lesions could have       been missed. No large lesions were present. Impression:           - The distal rectum and anal verge are normal on                        retroflexion view.                       - One 4 mm polyp in the sigmoid colon, removed with a  cold biopsy forceps. Resected and retrieved.                       - One 2 to 3 mm polyp in the ascending colon, removed                        with a cold biopsy forceps. Resected and retrieved.                       - Diverticulosis in the sigmoid colon.                       - The examination was otherwise normal. Recommendation:       - Discharge patient to home (with escort).                       -  Advance diet as tolerated and high fiber diet.                       - Continue present medications.                       - Await pathology results.                       - Repeat colonoscopy in 3 years with 2 day prep for                        surveillance due fair prep on this exam and due to                        polyps.                       - The findings and recommendations were discussed with                        the patient.                       - The findings and recommendations were discussed with                        the patient's family.                       - Return to primary care physician as previously                        scheduled.                       - Clinic follow up in 3-4 weeks Procedure Code(s):    --- Professional ---                       [phone], Colonoscopy, flexible; with biopsy, single or                        multiple Diagnosis Code(s):    --- Professional ---  D12.5, Benign neoplasm of sigmoid colon                       D12.2, Benign neoplasm of ascending colon                       R19.5, Other fecal abnormalities                       D50.9, Iron deficiency anemia, unspecified                       K57.30, Diverticulosis of large intestine without                        perforation or abscess without bleeding CPT copyright 2016 American Medical Association. All rights reserved. The codes documented in this report are preliminary and upon coder review may  be revised to meet current compliance requirements.  Vonda Antigua, MD Margretta Sidle B. Bonna Gains MD, MD 04/24/2017 2:27:31 PM This report has been signed electronically. Number of Addenda: 0 Note Initiated On: 04/24/2017 1:19 PM Scope Withdrawal Time: 0 hours 25 minutes 22 seconds  Total Procedure Duration: 0 hours 34 minutes 4 seconds  Estimated Blood Loss: Estimated blood loss: none.      [facility]

## 2017-04-24 NOTE — Transfer of Care (Signed)
Immediate Anesthesia Transfer of Care Note  Patient: Madison Craig  Procedure(s) Performed: COLONOSCOPY WITH PROPOFOL (N/A ) ESOPHAGOGASTRODUODENOSCOPY (EGD) WITH PROPOFOL (N/A )  Patient Location: PACU and Endoscopy Unit  Anesthesia Type:General  Level of Consciousness: sedated  Airway & Oxygen Therapy: Patient Spontanous Breathing and Patient connected to nasal cannula oxygen  Post-op Assessment: Report given to RN and Post -op Vital signs reviewed and stable  Post vital signs: Reviewed and stable  Last Vitals:  Vitals:   04/24/17 1234 04/24/17 1424  BP: (!) 175/86 (P) 123/69  Pulse: 85   Resp: 18   Temp: 36.5 C (!) (P) 36.1 C  SpO2: 99%     Last Pain:  Vitals:   04/24/17 1424  TempSrc: (P) Tympanic         Complications: No apparent anesthesia complications

## 2017-04-24 NOTE — Anesthesia Preprocedure Evaluation (Signed)
Anesthesia Evaluation  Patient identified by MRN, date of birth, ID band Patient awake    Reviewed: Allergy & Precautions, NPO status , Patient's Chart, lab work & pertinent test results  History of Anesthesia Complications Negative for: history of anesthetic complications  Airway Mallampati: II  TM Distance: >3 FB Neck ROM: Full    Dental  (+) Upper Dentures   Pulmonary asthma , sleep apnea (does not wear CPAP) , former smoker,    breath sounds clear to auscultation- rhonchi (-) wheezing      Cardiovascular Exercise Tolerance: Good hypertension, (-) CAD, (-) Past MI and (-) Cardiac Stents  Rhythm:Regular Rate:Normal - Systolic murmurs and - Diastolic murmurs    Neuro/Psych negative neurological ROS  negative psych ROS   GI/Hepatic Neg liver ROS, hiatal hernia, GERD  ,  Endo/Other  negative endocrine ROSneg diabetes  Renal/GU negative Renal ROS     Musculoskeletal  (+) Arthritis ,   Abdominal (+) + obese,   Peds  Hematology  (+) anemia ,   Anesthesia Other Findings Past Medical History: No date: Anemia No date: Arthritis 09/28/2013: Dyspnea     Comment:  Pulmonary function testing done on 02/09/2014 showed               minimal airway obstruction with a lack of response to               bronchodilators; her FEV1 was normal, the FEV1/FVC ratio               and the FEF 25-75% were reduced; the airway resistance               was normal; lung volumes were within normal limits; the               diffusing capacity was high for the measured volumes.   No date: GERD (gastroesophageal reflux disease) 01/11/2014: Macular degeneration     Comment:  Followed at [facility] in [place], [place].  No date: Multiple thyroid nodules     Comment:  Thyroid ultrasound on 01/25/2014 showed a multinodular               goiter, with a 1.5 cm nodule in the inferior left thyroid              lobe that met the criteria for  ultrasound-guided biopsy.               On 02/06/2014 an ultrasound guided needle aspirate biopsy               was performed of the left inferior thyroid nodule;               cytopathology findings were consistent with               non-neoplastic goiter.    09/18/2014: Osteoporosis     Comment:  DXA 08/16/2014 showed osteopenia of right femur neck               (T-score -2.3) and osteoporosis of left forearm radius               (T-score -2.9).  Lumbar spine was not utilized due to               advanced degenerative changes.  03/22/2009: SLEEP APNEA     Comment:  Had sleep study in [place] about 10 years, has BiPAP,  cannot sleep with it on.     Reproductive/Obstetrics                             Anesthesia Physical Anesthesia Plan  ASA: II  Anesthesia Plan: General   Post-op Pain Management:    Induction: Intravenous  PONV Risk Score and Plan: 2 and Propofol infusion  Airway Management Planned: Natural Airway  Additional Equipment:   Intra-op Plan:   Post-operative Plan:   Informed Consent: I have reviewed the patients History and Physical, chart, labs and discussed the procedure including the risks, benefits and alternatives for the proposed anesthesia with the patient or authorized representative who has indicated his/her understanding and acceptance.   Dental advisory given  Plan Discussed with: CRNA and Anesthesiologist  Anesthesia Plan Comments:         Anesthesia Quick Evaluation

## 2017-04-24 NOTE — H&P (Signed)
Madison Antigua, MD [address] Madison Craig, [place], [place], [address] [address], [place], [place], [place], [address] Phone: [phone]  Fax: [phone]  Primary Care Physician:  Leone Haven, MD   Pre-Procedure History & Physical: HPI:  Madison Craig is a 71 y.o. female is here for an egd and colonoscopy.   Past Medical History:  Diagnosis Date  . Anemia   . Arthritis   . Dyspnea 09/28/2013   Pulmonary function testing done on 02/09/2014 showed minimal airway obstruction with a lack of response to bronchodilators; her FEV1 was normal, the FEV1/FVC ratio and the FEF 25-75% were reduced; the airway resistance was normal; lung volumes were within normal limits; the diffusing capacity was high for the measured volumes.    Madison Craig GERD (gastroesophageal reflux disease)   . Macular degeneration 01/11/2014   Followed at [facility] in [place], [place].   . Multiple thyroid nodules    Thyroid ultrasound on 01/25/2014 showed a multinodular goiter, with a 1.5 cm nodule in the inferior left thyroid lobe that met the criteria for ultrasound-guided biopsy.  On 02/06/2014 an ultrasound guided needle aspirate biopsy was performed of the left inferior thyroid nodule; cytopathology findings were consistent with non-neoplastic goiter.     . Osteoporosis 09/18/2014   DXA 08/16/2014 showed osteopenia of right femur neck (T-score -2.3) and osteoporosis of left forearm radius (T-score -2.9).  Lumbar spine was not utilized due to advanced degenerative changes.   Madison Craig SLEEP APNEA 03/22/2009   Had sleep study in [place] about 10 years, has BiPAP, cannot sleep with it on.      Past Surgical History:  Procedure Laterality Date  . BREAST SURGERY  1988   breast biopsy    Prior to Admission medications   Medication Sig Start Date End Date Taking? Authorizing Provider  diclofenac (VOLTAREN) 75 MG EC tablet Take 1 tablet (75 mg total) by mouth 2 (two) times daily as needed. 02/05/17  Yes Cook,  Jayce G, DO  omeprazole (PRILOSEC) 40 MG capsule Take 1 capsule (40 mg total) by mouth daily. 02/05/17  Yes Cook, Barnie Del, DO  raloxifene (EVISTA) 60 MG tablet Take 1 tablet (60 mg total) by mouth daily. Patient not taking: Reported on 02/05/2017 07/24/16   Coral Spikes, DO    Allergies as of 04/09/2017  . (No Known Allergies)    Family History  Problem Relation Age of Onset  . Heart disease Father 30  . Sudden death Father   . Hypertension Father   . Hyperlipidemia Father   . Heart attack Father   . Diabetes Son 38  . Stroke Mother   . Hypertension Mother   . Dementia Mother   . Stroke Sister 46  . Alcohol abuse Sister   . Colon cancer Neg Hx   . Breast cancer Neg Hx     Social History   Socioeconomic History  . Marital status: Widowed    Spouse name: Not on file  . Number of children: 3  . Years of education: Not on file  . Highest education level: Not on file  Social Needs  . Financial resource strain: Not on file  . Food insecurity - worry: Not on file  . Food insecurity - inability: Not on file  . Transportation needs - medical: Not on file  . Transportation needs - non-medical: Not on file  Occupational History  . Not on file  Tobacco Use  . Smoking status: Former Smoker    Packs/day:  2.00    Years: 16.00    Pack years: 32.00    Types: Cigarettes    Last attempt to quit: 09/29/1978    Years since quitting: 38.5  . Smokeless tobacco: Never Used  Substance and Sexual Activity  . Alcohol use: No  . Drug use: No  . Sexual activity: No  Other Topics Concern  . Not on file  Social History Narrative  . Not on file    Review of Systems: See HPI, otherwise negative ROS  Physical Exam: BP (!) 175/86   Pulse 85   Temp 97.7 F (36.5 C) (Tympanic)   Resp 18   Ht 5' 2"  (1.575 m)   Wt 91.2 kg (201 lb)   SpO2 99%   BMI 36.76 kg/m  General:   Alert,  pleasant and cooperative in NAD Head:  Normocephalic and atraumatic. Neck:  Supple; no masses or  thyromegaly. Lungs:  Clear throughout to auscultation, normal respiratory effort.    Heart:  +S1, +S2, Regular rate and rhythm, No edema. Abdomen:  Soft, nontender and nondistended. Normal bowel sounds, without guarding, and without rebound.   Neurologic:  Alert and  oriented x4;  grossly normal neurologically.  Impression/Plan: Glendola Craig is here for a egd and colonoscopy to be performed for positive fobt  Risks, benefits, limitations, and alternatives regarding  colonoscopy have been reviewed with the patient.  Questions have been answered.  All parties agreeable.   Madison Manifold, MD  04/24/2017, 1:11 PM

## 2017-04-24 NOTE — Anesthesia Postprocedure Evaluation (Signed)
Anesthesia Post Note  Patient: Madison Craig  Procedure(s) Performed: COLONOSCOPY WITH PROPOFOL (N/A ) ESOPHAGOGASTRODUODENOSCOPY (EGD) WITH PROPOFOL (N/A )  Patient location during evaluation: Endoscopy Anesthesia Type: General Level of consciousness: awake and alert and oriented Pain management: pain level controlled Vital Signs Assessment: post-procedure vital signs reviewed and stable Respiratory status: spontaneous breathing, nonlabored ventilation and respiratory function stable Cardiovascular status: blood pressure returned to baseline and stable Postop Assessment: no signs of nausea or vomiting Anesthetic complications: no     Last Vitals:  Vitals:   04/24/17 1444 04/24/17 1454  BP: (!) 143/84 (!) 145/93  Pulse: 76 70  Resp: 15 18  Temp:    SpO2: 98% 98%    Last Pain:  Vitals:   04/24/17 1424  TempSrc: Tympanic                 [name]

## 2017-04-24 NOTE — Anesthesia Post-op Follow-up Note (Signed)
Anesthesia QCDR form completed.        

## 2017-04-27 ENCOUNTER — Encounter: Payer: Self-pay | Admitting: Gastroenterology

## 2017-04-28 LAB — SURGICAL PATHOLOGY

## 2017-04-30 ENCOUNTER — Telehealth: Payer: Self-pay

## 2017-04-30 NOTE — Telephone Encounter (Signed)
-----   Message from Virgel Manifold, MD sent at 04/30/2017 12:45 PM EST ----- Please set up patient for a small bowel capsule due to iron deficiency. Please let her know her biopsies showed a benign polyp and she should have a repeat colonoscopy in 3 years with a 2 day prep since she had fair prep on her last procedure.

## 2017-04-30 NOTE — Telephone Encounter (Signed)
LVM for patient callback for results and to schedule capsule study per [place].     - Please set up patient for a small bowel capsule due to iron deficiency. Please let her know her biopsies showed a benign polyp and she should have a repeat colonoscopy in 3 years with a 2 day prep since she had fair prep on her last procedure.

## 2017-05-14 ENCOUNTER — Telehealth: Payer: Self-pay | Admitting: Gastroenterology

## 2017-05-14 NOTE — Telephone Encounter (Signed)
Left voice message for patient to call and schedule a 4 month follow up with Dr. Bonna Gains

## 2017-06-17 ENCOUNTER — Ambulatory Visit: Payer: PPO | Admitting: Gastroenterology

## 2017-06-17 ENCOUNTER — Other Ambulatory Visit: Payer: Self-pay

## 2017-06-17 ENCOUNTER — Encounter (INDEPENDENT_AMBULATORY_CARE_PROVIDER_SITE_OTHER): Payer: Self-pay

## 2017-06-17 ENCOUNTER — Encounter: Payer: Self-pay | Admitting: Gastroenterology

## 2017-06-17 VITALS — BP 154/81 | HR 88 | Wt 205.0 lb

## 2017-06-17 DIAGNOSIS — K209 Esophagitis, unspecified without bleeding: Secondary | ICD-10-CM

## 2017-06-17 DIAGNOSIS — D500 Iron deficiency anemia secondary to blood loss (chronic): Secondary | ICD-10-CM | POA: Diagnosis not present

## 2017-06-17 MED ORDER — FERROUS SULFATE 325 (65 FE) MG PO TABS: 325.0000 mg | ORAL_TABLET | Freq: Two times a day (BID) | ORAL | 3 refills | Status: DC

## 2017-06-17 NOTE — Progress Notes (Signed)
Vonda Antigua, MD [address]  [place]  [place], [place] [address]  Main: [phone]  Fax: [phone]   Primary Care Physician: Leone Haven, MD  Primary Gastroenterologist:  Dr. Vonda Antigua  Follow up for Iron deficiency anemia   HPI: Madison Craig is a 72 y.o. female initially seen as an outpatient referral on November 15 for positive FOBT.  Patient has undergone colonoscopy and EGD for positive FOBT and iron deficiency on November 30, with colonoscopy showing diverticulosis and 2 small polyps, 1 of them being tubular adenoma the other one a lymphoid aggregate.  Repeat recommended in 3 years with 2-day prep due to fair prep on that exam.  EGD showed grade a esophagitis, hiatal hernia, erythematous gastric mucosa.  Biopsies showed unremarkable duodenal mucosa negative for features of celiac disease.  Chronic nonspecific gastritis.  October 2018 ferritin is 8, iron 45.   CBC showed a hemoglobin of 11.6 in September 2018.  Normal MCV at 88. Hgb was 12.2 prior to that.   Patient reports a good appetite, no abdominal pain, no nausea vomiting, no weight loss, no blood in stool.  Small bowel capsule was recommended, and patient was called to schedule this.  Patient states that she did not want this done as she does not want to pay the co-pay that might be required for small bowel capsule.  Current Outpatient Medications  Medication Sig Dispense Refill  . diclofenac (VOLTAREN) 75 MG EC tablet Take 1 tablet (75 mg total) by mouth 2 (two) times daily as needed. 60 tablet 1  . omeprazole (PRILOSEC) 40 MG capsule Take 1 capsule (40 mg total) by mouth daily. 90 capsule 3  . raloxifene (EVISTA) 60 MG tablet Take 1 tablet (60 mg total) by mouth daily. (Patient not taking: Reported on 02/05/2017) 90 tablet 3   No current facility-administered medications for this visit.     Allergies as of 06/17/2017  . (No Known Allergies)    ROS:  General: Negative for  anorexia, weight loss, fever, chills, fatigue, weakness. ENT: Negative for hoarseness, difficulty swallowing , nasal congestion. CV: Negative for chest pain, angina, palpitations, dyspnea on exertion, peripheral edema.  Respiratory: Negative for dyspnea at rest, dyspnea on exertion, cough, sputum, wheezing.  GI: See history of present illness. GU:  Negative for dysuria, hematuria, urinary incontinence, urinary frequency, nocturnal urination.  Endo: Negative for unusual weight change.    Physical Examination:  Vitals  Vitals:   06/17/17 1020  BP: (!) 154/81  Pulse: 88  Weight: 205 lb (93 kg)   General: Well-nourished, well-developed in no acute distress.  Eyes: No icterus. Conjunctivae pink. Mouth: Oropharyngeal mucosa moist and pink , no lesions erythema or exudate. Lungs: Clear to auscultation bilaterally. Non-labored. Heart: Regular rate and rhythm, no murmurs rubs or gallops.  Abdomen: Bowel sounds are normal, nontender, nondistended, no hepatosplenomegaly or masses, no abdominal bruits or hernia , no rebound or guarding.   Extremities: No lower extremity edema. No clubbing or deformities. Neuro: Alert and oriented x 3.  Grossly intact. Skin: Warm and dry, no jaundice.   Psych: Alert and cooperative, normal mood and affect.   Labs: CMP     Component Value Date/Time   NA 136 02/05/2017 1055   K 4.1 02/05/2017 1055   CL 102 02/05/2017 1055   CO2 29 02/05/2017 1055   GLUCOSE 100 (H) 02/05/2017 1055   BUN 20 02/05/2017 1055   CREATININE 0.96 02/05/2017 1055   CREATININE 0.93 07/05/2014 1047  CALCIUM 9.5 02/05/2017 1055   PROT 7.1 02/05/2017 1055   ALBUMIN 4.1 02/05/2017 1055   AST 24 02/05/2017 1055   ALT 22 02/05/2017 1055   ALKPHOS 58 02/05/2017 1055   BILITOT 0.5 02/05/2017 1055   GFRNONAA 64 07/05/2014 1047   GFRAA 74 07/05/2014 1047   Lab Results  Component Value Date   WBC 6.1 03/13/2017   HGB 11.2 (L) 03/13/2017   HCT 33.7 (L) 03/13/2017   MCV 86.4  03/13/2017   PLT 241 03/13/2017    Imaging Studies: No results found.  Assessment and Plan:   Madison Craig is a 72 y.o. y/o female here for follow-up of positive FOBT and iron deficiency  EGD showed grade a esophagitis, with no evidence of celiac disease on duodenal biopsies Patient is on omeprazole daily due to heartburn and esophagitis with no clinical symptoms present at this time Acid reflux lifestyle modifications encouraged including not eating 3 hours before bedtime, decreasing caffeine intake, avoiding exacerbating foods. I have asked patient to buy a bed wedge as well.  Colonoscopy did not show any colonic malignancy, one tubular adenoma and one lymphoid aggregate polyp removed, repeat recommended in 3 years with 2-day prep due to fair prep on that exam  Small bowel capsule as the next step due to her iron deficiency.  I have discussed this extensively with her.  She does not want to pay for a small bowel capsule.  I have offered, that our staff can call her insurance and depending on what we find out about how much she might have to pay, we can let her know.  She is agreeable to this plan.  I have discussed that small bowel capsule can find AVMs, which would likely require chronic iron replacement therapy and no further intervention as long as none of them are large or actively bleeding.  However, it would also evaluate for any underlying malignancy, ulcers or other lesions, and if he chooses not to undergo a small bowel capsule, these can be missed.  She verbalized understanding, but would not like to schedule a small bowel capsule at this time, and would rather have have [place] talk to the insurance first to find out her co-pay, and let her know after which she can decide if she would like to undergo the small bowel capsule.  I will start her on oral iron twice daily.  Primary care physician can monitor her hemoglobin and iron level over time and alter the medication as needed.   I have asked her to intake a high-fiber diet to avoid constipation, and if she develops constipation to let us know.    Dr Vonda Antigua

## 2017-07-09 ENCOUNTER — Other Ambulatory Visit: Payer: Self-pay | Admitting: Family Medicine

## 2017-07-10 ENCOUNTER — Telehealth: Payer: Self-pay

## 2017-07-10 NOTE — Telephone Encounter (Signed)
LMTCO. Have cost/copays for small capsule study. $225 to $300. Does she want this done?

## 2017-07-14 NOTE — Telephone Encounter (Signed)
Refill given.  She needs to keep her follow-up appointment.  Thanks.

## 2017-07-14 NOTE — Telephone Encounter (Signed)
Okay to refill? LOV with Dr. Lacinda Axon on: 02/05/17 NOV with Caryl Bis on: 08/11/17

## 2017-07-15 ENCOUNTER — Encounter: Payer: Self-pay | Admitting: *Deleted

## 2017-07-15 NOTE — Telephone Encounter (Signed)
Sent mychart message

## 2017-07-24 NOTE — Telephone Encounter (Signed)
Unread mychart message mailed to patient 

## 2017-08-11 ENCOUNTER — Encounter: Payer: Self-pay | Admitting: Family Medicine

## 2017-08-11 ENCOUNTER — Ambulatory Visit (INDEPENDENT_AMBULATORY_CARE_PROVIDER_SITE_OTHER): Payer: PPO | Admitting: Family Medicine

## 2017-08-11 ENCOUNTER — Other Ambulatory Visit: Payer: Self-pay

## 2017-08-11 VITALS — BP 150/86 | HR 61 | Temp 97.6°F | Wt 204.4 lb

## 2017-08-11 DIAGNOSIS — J309 Allergic rhinitis, unspecified: Secondary | ICD-10-CM | POA: Diagnosis not present

## 2017-08-11 DIAGNOSIS — I10 Essential (primary) hypertension: Secondary | ICD-10-CM

## 2017-08-11 DIAGNOSIS — K219 Gastro-esophageal reflux disease without esophagitis: Secondary | ICD-10-CM

## 2017-08-11 DIAGNOSIS — H3530 Unspecified macular degeneration: Secondary | ICD-10-CM

## 2017-08-11 DIAGNOSIS — D500 Iron deficiency anemia secondary to blood loss (chronic): Secondary | ICD-10-CM | POA: Diagnosis not present

## 2017-08-11 DIAGNOSIS — M1990 Unspecified osteoarthritis, unspecified site: Secondary | ICD-10-CM

## 2017-08-11 LAB — CBC
HCT: 34.7 % — ABNORMAL LOW (ref 36.0–46.0)
Hemoglobin: 11.6 g/dL — ABNORMAL LOW (ref 12.0–15.0)
MCHC: 33.3 g/dL (ref 30.0–36.0)
MCV: 85 fl (ref 78.0–100.0)
Platelets: 236 10*3/uL (ref 150.0–400.0)
RBC: 4.08 Mil/uL (ref 3.87–5.11)
RDW: 14.2 % (ref 11.5–15.5)
WBC: 5.9 10*3/uL (ref 4.0–10.5)

## 2017-08-11 LAB — COMPREHENSIVE METABOLIC PANEL
ALT: 20 U/L (ref 0–35)
AST: 20 U/L (ref 0–37)
Albumin: 4.2 g/dL (ref 3.5–5.2)
Alkaline Phosphatase: 64 U/L (ref 39–117)
BILIRUBIN TOTAL: 0.5 mg/dL (ref 0.2–1.2)
BUN: 13 mg/dL (ref 6–23)
CALCIUM: 9.6 mg/dL (ref 8.4–10.5)
CO2: 29 meq/L (ref 19–32)
CREATININE: 0.86 mg/dL (ref 0.40–1.20)
Chloride: 100 mEq/L (ref 96–112)
GFR: 69.32 mL/min (ref >60.00)
Glucose, Bld: 100 mg/dL — ABNORMAL HIGH (ref 70–99)
Potassium: 4.2 mEq/L (ref 3.5–5.1)
Sodium: 135 mEq/L (ref 135–145)
Total Protein: 7.4 g/dL (ref 6.0–8.3)

## 2017-08-11 LAB — IRON,TIBC AND FERRITIN PANEL
%SAT: 10 % (calc) — ABNORMAL LOW (ref 11–50)
Ferritin: 7 ng/mL — ABNORMAL LOW (ref 20–288)
IRON: 44 ug/dL — AB (ref 45–160)
TIBC: 424 ug/dL (ref 250–450)

## 2017-08-11 MED ORDER — FLUTICASONE PROPIONATE 50 MCG/ACT NA SUSP: 2.0000 | Freq: Every day | NASAL | 6 refills | Status: DC

## 2017-08-11 NOTE — Patient Instructions (Signed)
Nice to see you. We will try Flonase for your congestion and sinus issues.  If this is not beneficial over the next week or so please let us know. Please continue on the Prilosec. We will contact you when we hear back from GI regarding the diclofenac. We will call you with your lab results.

## 2017-08-11 NOTE — Assessment & Plan Note (Signed)
Upper respiratory symptoms are chronic and not consistent with sinusitis.  Discussed symptomatic treatment with Flonase.  If not improving we can have her go back to ENT.

## 2017-08-11 NOTE — Assessment & Plan Note (Addendum)
Similarly elevated on recheck.  Borderline for age.  Discussed checking it at home.  She will send [place] a message in 2 weeks with her numbers.  She will work on diet and exercise.

## 2017-08-11 NOTE — Assessment & Plan Note (Signed)
Chronic issue.  Diclofenac is beneficial and helps her work.  I discussed the risk of this medication with her potential ongoing blood loss from a GI source.  Discussed the option of tramadol though she does not want narcotics or narcotic-like medications.  I will send a message to her GI physician to run this medication by them as well.  Short-term refill given.

## 2017-08-11 NOTE — Assessment & Plan Note (Signed)
Unknown source.  She is not willing to undergo capsule endoscopy.  We will recheck labs today.  She will start on iron supplementation.  Will likely need to recheck in 1-2 months as well with lab work.

## 2017-08-11 NOTE — Assessment & Plan Note (Signed)
Seems to be controlled with Prilosec.  Recently underwent EGD and colonoscopy.  She will continue on Prilosec.

## 2017-08-11 NOTE — Progress Notes (Signed)
Tommi Rumps, MD Phone: [phone]  Madison Craig is a 72 y.o. female who presents today for f/u.  GERD: Taking Prilosec.  This controls her reflux.  If she does not take it she gets burning and sour taste.  No abdominal pain.  She was evaluated by GI for iron deficiency anemia after having heme positive stools.  She had an EGD and colonoscopy with no obvious cause.  They recommended capsule endoscopy though she is unable to afford this.  She notes they advised no further follow-up and that we should monitor her iron and hemoglobin.  She has not been taking iron supplementation.  She is going to pick this up and start it.  She has not noticed blood in her stool.  She reports chronic sinus and nasal congestion that has been going on for years.  Notes her right ear started to hurt her yesterday.  She has chronic sinus drainage.  Does not blow much out of her nose.  She saw ENT in the past and they advised she use a hearing aid.  She has taken Zyrtec in the past which did help some though dried her out.  She has not used any nose spray.  Osteoarthritis: Currently on diclofenac for this.  She tried multiple other NSAIDs with little benefit.  She does not want any narcotic-like medications.  Elevated BP: Notes typically runs in the 140s over 70s at home.  She has not checked it recently.  She does not want blood pressure medication and wants to work on diet and exercise.  No chest pain or shortness of breath.  Social History   Tobacco Use  Smoking Status Former Smoker  . Packs/day: 2.00  . Years: 16.00  . Pack years: 32.00  . Types: Cigarettes  . Last attempt to quit: 09/29/1978  . Years since quitting: 38.8  Smokeless Tobacco Never Used     ROS see history of present illness  Objective  Physical Exam Vitals:   08/11/17 1102  BP: (!) 150/86  Pulse: 61  Temp: 97.6 F (36.4 C)  SpO2: 97%    BP Readings from Last 3 Encounters:  08/11/17 (!) 150/86  06/17/17 (!)  154/81  04/24/17 (!) 145/93   Wt Readings from Last 3 Encounters:  08/11/17 204 lb 6.4 oz (92.7 kg)  06/17/17 205 lb (93 kg)  04/24/17 201 lb (91.2 kg)    Physical Exam  Constitutional: No distress.  HENT:  Head: Normocephalic and atraumatic.  Mouth/Throat: Oropharynx is clear and moist.  Normal TMs bilaterally  Eyes: Conjunctivae are normal. Pupils are equal, round, and reactive to light.  Neck: Neck supple.  Cardiovascular: Normal rate, regular rhythm and normal heart sounds.  Pulmonary/Chest: Effort normal and breath sounds normal.  Abdominal: Soft. Bowel sounds are normal. She exhibits no distension. There is no tenderness. There is no rebound and no guarding.  Musculoskeletal: She exhibits no edema.  Lymphadenopathy:    She has no cervical adenopathy.  Neurological: She is alert. Gait normal.  Skin: Skin is warm and dry. She is not diaphoretic.     Assessment/Plan: Please see individual problem list.  Essential hypertension Similarly elevated on recheck.  Borderline for age.  Discussed checking it at home.  She will send [place] a message in 2 weeks with her numbers.  She will work on diet and exercise.  GERD (gastroesophageal reflux disease) Seems to be controlled with Prilosec.  Recently underwent EGD and colonoscopy.  She will continue on Prilosec.  Iron deficiency  anemia due to chronic blood loss Unknown source.  She is not willing to undergo capsule endoscopy.  We will recheck labs today.  She will start on iron supplementation.  Will likely need to recheck in 1-2 months as well with lab work.  Macular degeneration Continues to follow with her ophthalmologist.  Osteoarthritis Chronic issue.  Diclofenac is beneficial and helps her work.  I discussed the risk of this medication with her potential ongoing blood loss from a GI source.  Discussed the option of tramadol though she does not want narcotics or narcotic-like medications.  I will send a message to her GI  physician to run this medication by them as well.  Short-term refill given.  Allergic rhinitis Upper respiratory symptoms are chronic and not consistent with sinusitis.  Discussed symptomatic treatment with Flonase.  If not improving we can have her go back to ENT.   Orders Placed This Encounter  Procedures  . Comp Met (CMET)  . CBC  . Iron, TIBC and Ferritin Panel    Meds ordered this encounter  Medications  . fluticasone (FLONASE) 50 MCG/ACT nasal spray    Sig: Place 2 sprays into both nostrils daily.    Dispense:  16 g    Refill:  [place], MD [place]

## 2017-08-11 NOTE — Assessment & Plan Note (Signed)
Continues to follow with her ophthalmologist.

## 2017-08-12 ENCOUNTER — Telehealth: Payer: Self-pay | Admitting: Family Medicine

## 2017-08-12 DIAGNOSIS — D509 Iron deficiency anemia, unspecified: Secondary | ICD-10-CM

## 2017-08-12 NOTE — Telephone Encounter (Signed)
-----   Message from Virgel Manifold, MD sent at 08/11/2017  1:12 PM EDT ----- Hi Dr. Caryl Bis,  Thank you for the update. The PPI she is on, should help provide some protection against GI ulcers while she is on the NSAID. If she doesn't want to stop the NSAIDs, I would recommend continuing the PPI, while she is on the NSAIDs.  I would also recommend referring her to hematology, to see if she would need IV iron transfusions, based on her repeat ferritin, and iron levels.   ----- Message ----- From: Leone Haven, MD Sent: 08/11/2017  12:43 PM To: Virgel Manifold, MD  Hi Dr Bonna Gains,   I saw Madison Craig today for follow-up. Thank you for seeing her previously. I discussed her iron deficiency and prior work up with her and she still does not want to proceed with capsule endoscopy. I am rechecking her iron studies and cbc today. She continues on diclofenac and is hesitant to stop this medication due to the benefit for her OA. I discussed the risk of this medication particularly with her GI bleed history and discussed alternatives, though she does not want to change. I wanted to see what you thought about continued use of the diclofenac given her recent history and work up with endoscopy and colonoscopy. Thanks for any insight you can give.   Randall Hiss

## 2017-08-12 NOTE — Telephone Encounter (Signed)
Patient has been informed of the below. She had no question ,comments, or concerns at this time.  Pt stated she will comply with directions.

## 2017-08-12 NOTE — Telephone Encounter (Signed)
Please call the patient and get her scheduled for follow-up labs in one month. Orders have been placed. Thanks.

## 2017-08-12 NOTE — Telephone Encounter (Signed)
Please let the patient know that I heard back from GI.  They noted that remaining on diclofenac would be okay as long as she remains on the Prilosec.  They also suggested having her see hematology depending on what her iron levels are.  They appear to still be low though she has not been taking the iron supplement.  She needs to take the iron supplement and we can recheck in 1 month.  If still low then she will need to see hematology to consider iron transfusions.  Thanks.

## 2017-08-12 NOTE — Addendum Note (Signed)
Addended by: Caryl Bis, Emmajo Bennette G on: 08/12/2017 12:14 PM   Modules accepted: Orders

## 2017-08-19 NOTE — Telephone Encounter (Signed)
Pt states she got a letter regarding small capsule study from her insurance that they would not cover it. I informed pt what insurance quoted when office called them. She states she is not interested right now and that she is paying off her colonoscopy right now. Pt encouraged to contact office if and when she would like this done in the future.

## 2017-08-24 ENCOUNTER — Ambulatory Visit (INDEPENDENT_AMBULATORY_CARE_PROVIDER_SITE_OTHER): Payer: PPO

## 2017-08-24 VITALS — BP 122/64 | HR 86 | Temp 98.6°F | Resp 15 | Ht 61.5 in | Wt 203.4 lb

## 2017-08-24 DIAGNOSIS — Z0000 Encounter for general adult medical examination without abnormal findings: Secondary | ICD-10-CM | POA: Diagnosis not present

## 2017-08-24 NOTE — Progress Notes (Signed)
Subjective:   Madison Craig is a 72 y.o. female who presents for Medicare Annual (Subsequent) preventive examination.  Review of Systems:  No ROS.  Medicare Wellness Visit. Additional risk factors are reflected in the social history. Cardiac Risk Factors include: advanced age (>8mn, >>61women);hypertension     Objective:     Vitals: BP 122/64 (BP Location: Left Arm, Patient Position: Sitting, Cuff Size: Normal)   Pulse 86   Temp 98.6 F (37 C) (Oral)   Resp 15   Ht 5' 1.5" (1.562 m)   Wt 203 lb 6.4 oz (92.3 kg)   SpO2 97%   BMI 37.81 kg/m   Body mass index is 37.81 kg/m.  Advanced Directives 08/24/2017 04/24/2017 08/21/2016 07/13/2015 02/05/2015 12/15/2014 10/03/2014  Does Patient Have a Medical Advance Directive? _0  No No  Would patient like information on creating a medical advance directive? Yes (MAU/Ambulatory/Procedural Areas - Information given) - No - Patient declined No - patient declined information Yes - Educational materials given - No - patient declined information    Tobacco Social History   Tobacco Use  Smoking Status Former Smoker  . Packs/day: 2.00  . Years: 16.00  . Pack years: 32.00  . Types: Cigarettes  . Last attempt to quit: 09/29/1978  . Years since quitting: 38.9  Smokeless Tobacco Never Used     Counseling given: Not Answered   Clinical Intake:  Pre-visit preparation completed: Yes  Pain : No/denies pain     Diabetes: No(Pre-diabetes)  How often do you need to have someone help you when you read instructions, pamphlets, or other written materials from your doctor or pharmacy?: 1 - Never  Interpreter Needed?: No     Past Medical History:  Diagnosis Date  . Anemia   . Arthritis   . Dyspnea 09/28/2013   Pulmonary function testing done on 02/09/2014 showed minimal airway obstruction with a lack of response to bronchodilators; her FEV1 was normal, the FEV1/FVC ratio and the FEF 25-75% were reduced; the airway  resistance was normal; lung volumes were within normal limits; the diffusing capacity was high for the measured volumes.    .Marland KitchenGERD (gastroesophageal reflux disease)   . Macular degeneration 01/11/2014   [facility] [place]   . Multiple thyroid nodules    Thyroid ultrasound on 01/25/2014 showed a multinodular goiter, with a 1.5 cm nodule in the inferior left thyroid lobe that met the criteria for ultrasound-guided biopsy.  On 02/06/2014 an ultrasound guided needle aspirate biopsy was performed of the left inferior thyroid nodule; cytopathology findings were consistent with non-neoplastic goiter.     . Osteoporosis 09/18/2014   DXA 08/16/2014 showed osteopenia of right femur neck (T-score -2.3) and osteoporosis of left forearm radius (T-score -2.9).  Lumbar spine was not utilized due to advanced degenerative changes.   .Marland KitchenSLEEP APNEA 03/22/2009   [place], has BiPAP, cannot sleep with it on.     Past Surgical History:  Procedure Laterality Date  . BREAST SURGERY  1988   breast biopsy  . COLONOSCOPY WITH PROPOFOL N/A 04/24/2017   Procedure: COLONOSCOPY WITH PROPOFOL;  Surgeon: TVirgel Manifold MD;  Location: [facility] ENDOSCOPY;  Service: Endoscopy;  Laterality: N/A;  . ESOPHAGOGASTRODUODENOSCOPY (EGD) WITH PROPOFOL N/A 04/24/2017   Procedure: ESOPHAGOGASTRODUODENOSCOPY (EGD) WITH PROPOFOL;  Surgeon: TVirgel Manifold MD;  Location: [facility] ENDOSCOPY;  Service: Endoscopy;  Laterality: N/A;   Family History  Problem Relation  Age of Onset  . Heart disease Father 97  . Sudden death Father   . Hypertension Father   . Hyperlipidemia Father   . Heart attack Father   . Diabetes Son 41  . Stroke Mother   . Hypertension Mother   . Dementia Mother   . Stroke Sister 62  . Alcohol abuse Sister   . Colon cancer Neg Hx   . Breast cancer Neg Hx    Social History   Socioeconomic History  . Marital status: Widowed    Spouse name: Not on  file  . Number of children: 3  . Years of education: Not on file  . Highest education level: Not on file  Occupational History  . Not on file  Social Needs  . Financial resource strain: Not hard at all  . Food insecurity:    Worry: Never true    Inability: Never true  . Transportation needs:    Medical: No    Non-medical: No  Tobacco Use  . Smoking status: Former Smoker    Packs/day: 2.00    Years: 16.00    Pack years: 32.00    Types: Cigarettes    Last attempt to quit: 09/29/1978    Years since quitting: 38.9  . Smokeless tobacco: Never Used  Substance and Sexual Activity  . Alcohol use: No  . Drug use: No  . Sexual activity: Never  Lifestyle  . Physical activity:    Days per week: Not on file    Minutes per session: Not on file  . Stress: Not on file  Relationships  . Social connections:    Talks on phone: Not on file    Gets together: Not on file    Attends religious service: Not on file    Active member of club or organization: Not on file    Attends meetings of clubs or organizations: Not on file    Relationship status: Not on file  Other Topics Concern  . Not on file  Social History Narrative  . Not on file    Outpatient Encounter Medications as of 08/24/2017  Medication Sig  . Calcium Carb-Cholecalciferol (CALCIUM/VITAMIN D PO) Take 1 tablet by mouth.  . diclofenac (VOLTAREN) 75 MG EC tablet TAKE 1 TABLET BY MOUTH TWICE DAILY AS NEEDED  . fluticasone (FLONASE) 50 MCG/ACT nasal spray Place 2 sprays into both nostrils daily.  Marland Kitchen omeprazole (PRILOSEC) 40 MG capsule Take 1 capsule (40 mg total) by mouth daily.  . ferrous sulfate (FERROUSUL) 325 (65 FE) MG tablet Take 1 tablet (325 mg total) by mouth 2 (two) times daily.   No facility-administered encounter medications on file as of 08/24/2017.     Activities of Daily Living In your present state of health, do you have any difficulty performing the following activities: 08/24/2017  Hearing? Y  Vision? N    Difficulty concentrating or making decisions? N  Walking or climbing stairs? N  Dressing or bathing? N  Doing errands, shopping? N  Preparing Food and eating ? N  Using the Toilet? N  In the past six months, have you accidently leaked urine? N  Do you have problems with loss of bowel control? N  Managing your Medications? N  Managing your Finances? N  Housekeeping or managing your Housekeeping? N  Some recent data might be hidden    Patient Care Team: Leone Haven, MD as PCP - General (Family Medicine)    Assessment:   This is a routine wellness examination  for [employer].  The goal of the wellness visit is to assist the patient how to close the gaps in care and create a preventative care plan for the patient.   The roster of all physicians providing medical care to patient is listed in the Snapshot section of the chart.  Taking calcium VIT D as appropriate/Osteoporosis reviewed.    Safety issues reviewed; Smoke and carbon monoxide detectors in the home. No firearms in the home. Wears seatbelts when driving or riding with others. No violence in the home.  They do not have excessive sun exposure.  Discussed the need for sun protection: hats, long sleeves and the use of sunscreen if there is significant sun exposure.  Patient is alert, normal appearance, oriented to person/place/and time.  Correctly identified the president of the [place] and recalls of 2/3 words. Performs simple calculations and can read correct time from watch face. Displays appropriate judgement.  No new identified risk were noted.  No failures at ADL's or IADL's.    BMI- discussed the importance of a healthy diet, water intake and the benefits of aerobic exercise. She plans eat a low sodium diet, walk for exercise and increase her water intake.   Dental- dentures.  Eye- Visual acuity not assessed per patient preference since they have regular follow up with the ophthalmologist.  Wears corrective  lenses.  Sleep patterns- Sleeps 6 hours at night.  Wakes feeling rested. BIPAP not in use.  Most recent labs discussed.  She will schedule 1 month appointment for recheck per physician order.    HTN; followed by PCP.  States she is keeping a log at home and numbers are within range.  She plans to bring the log back and drop it off.  Health maintenance gaps- closed.  Patient Concerns: None at this time. Follow up with PCP as needed.  Exercise Activities and Dietary recommendations Current Exercise Habits: Home exercise routine, Type of exercise: walking, Frequency (Times/Week): 4, Intensity: Mild  Goals    . Increase physical activity     Walk for exercise       Fall Risk Fall Risk  08/24/2017 08/11/2017 07/24/2016 07/13/2015 02/05/2015  Falls in the past year? _0    Depression Screen PHQ 2/9 Scores 08/24/2017 08/11/2017 07/24/2016 07/13/2015  PHQ - 2 Score 0 0 0 0     Cognitive Function MMSE - Mini Mental State Exam 08/24/2017 08/21/2016  Orientation to time 5 5  Orientation to Place 5 5  Registration 3 3  Attention/ Calculation 5 5  Recall 2 3  Language- name 2 objects 2 2  Language- repeat 1 1  Language- follow 3 step command 3 3  Language- read & follow direction 1 1  Write a sentence 1 1  Copy design 1 1  Total score 29 30        Immunization History  Administered Date(s) Administered  . Influenza,inj,Quad PF,6+ Mos 04/05/2014, 02/05/2015, 01/23/2016  . Pneumococcal Conjugate-13 01/11/2014  . Pneumococcal Polysaccharide-23 12/03/2015  . Tdap 01/11/2014   Screening Tests Health Maintenance  Topic Date Due  . INFLUENZA VACCINE  04/13/2018 (Originally 12/24/2017)  . MAMMOGRAM  04/16/2019  . COLONOSCOPY  04/24/2020  . TETANUS/TDAP  01/12/2024  . DEXA SCAN  Completed  . Hepatitis C Screening  Completed  . PNA vac Low Risk Adult  Completed      Plan:   End of life planning; Advanced aging; Advanced directives discussed.  No HCPOA/Living Will.  Additional  information provided to  help them start the conversation with family per physician verbal order.  Copy of HCPOA/Living Will requested upon completion. Time spent on this topic is 20 minutes.  I have personally reviewed and noted the following in the patient's chart:   . Medical and social history . Use of alcohol, tobacco or illicit drugs  . Current medications and supplements . Functional ability and status . Nutritional status . Physical activity . Advanced directives . List of other physicians . Hospitalizations, surgeries, and ER visits in previous 12 months . Vitals . Screenings to include cognitive, depression, and falls . Referrals and appointments  In addition, I have reviewed and discussed with patient certain preventive protocols, quality metrics, and best practice recommendations. A written personalized care plan for preventive services as well as general preventive health recommendations were provided to patient.     Varney Biles, LPN  0/07/5246

## 2017-08-24 NOTE — Patient Instructions (Addendum)
  Ms. Nottingham , Thank you for taking time to come for your Medicare Wellness Visit. I appreciate your ongoing commitment to your health goals. Please review the following plan we discussed and let me know if I can assist you in the future.   Follow up with Dr. Biagio Quint as needed.    Bring a copy of your [place] and/or Living Will to be scanned into chart once completed.    Have a great day!  These are the goals we discussed: Goals    . Increase physical activity     Walk for exercise       This is a list of the screening recommended for you and due dates:  Health Maintenance  Topic Date Due  . Flu Shot  04/13/2018*  . Mammogram  04/16/2019  . Colon Cancer Screening  04/24/2020  . Tetanus Vaccine  01/12/2024  . DEXA scan (bone density measurement)  Completed  .  Hepatitis C: One time screening is recommended by Center for Disease Control  (CDC) for  adults born from [birth year] through 1963.   Completed  . Pneumonia vaccines  Completed  *Topic was postponed. The date shown is not the original due date.

## 2017-09-07 ENCOUNTER — Other Ambulatory Visit (INDEPENDENT_AMBULATORY_CARE_PROVIDER_SITE_OTHER): Payer: PPO

## 2017-09-07 DIAGNOSIS — D509 Iron deficiency anemia, unspecified: Secondary | ICD-10-CM

## 2017-09-07 NOTE — Addendum Note (Signed)
Addended by: Arby Barrette on: 09/07/2017 09:44 AM   Modules accepted: Orders

## 2017-09-08 ENCOUNTER — Telehealth: Payer: Self-pay

## 2017-09-08 DIAGNOSIS — D649 Anemia, unspecified: Secondary | ICD-10-CM

## 2017-09-08 LAB — CBC
HCT: 36.5 % (ref 35.0–45.0)
Hemoglobin: 12.4 g/dL (ref 11.7–15.5)
MCH: 28.4 pg (ref 27.0–33.0)
MCHC: 34 g/dL (ref 32.0–36.0)
MCV: 83.5 fL (ref 80.0–100.0)
MPV: 10 fL (ref 7.5–12.5)
Platelets: 271 10*3/uL (ref 140–400)
RBC: 4.37 10*6/uL (ref 3.80–5.10)
RDW: 14.8 % (ref 11.0–15.0)
WBC: 6.7 10*3/uL (ref 3.8–10.8)

## 2017-09-08 LAB — IRON,TIBC AND FERRITIN PANEL
%SAT: 29 % (ref 11–50)
Ferritin: 13 ng/mL — ABNORMAL LOW (ref 20–288)
IRON: 115 ug/dL (ref 45–160)
TIBC: 393 ug/dL (ref 250–450)

## 2017-09-08 NOTE — Addendum Note (Signed)
Addended by: Leone Haven on: 09/08/2017 12:37 PM   Modules accepted: Orders

## 2017-09-08 NOTE — Telephone Encounter (Signed)
Patient has appointment scheduled for lab appointment in a month. Can you please place order for lab. Thank you

## 2017-09-08 NOTE — Telephone Encounter (Signed)
Ordered

## 2017-09-08 NOTE — Telephone Encounter (Signed)
-----   Message from Leone Haven, MD sent at 09/08/2017 10:02 AM EDT ----- Please let the patient know that her iron is improved though her iron stores do remain low.  There are 2 options.  She can continue the oral iron supplementation and we can recheck again in a month or we can refer to hematology to consider iron infusions.  Please let me know which she would prefer.  Thanks.

## 2017-10-08 ENCOUNTER — Other Ambulatory Visit (INDEPENDENT_AMBULATORY_CARE_PROVIDER_SITE_OTHER): Payer: PPO

## 2017-10-08 DIAGNOSIS — D649 Anemia, unspecified: Secondary | ICD-10-CM

## 2017-10-08 LAB — CBC
HEMATOCRIT: 35.9 % — AB (ref 36.0–46.0)
HEMOGLOBIN: 12.1 g/dL (ref 12.0–15.0)
MCHC: 33.7 g/dL (ref 30.0–36.0)
MCV: 86.7 fl (ref 78.0–100.0)
PLATELETS: 218 10*3/uL (ref 150.0–400.0)
RBC: 4.14 Mil/uL (ref 3.87–5.11)
RDW: 15.5 % (ref 11.5–15.5)
WBC: 5.9 10*3/uL (ref 4.0–10.5)

## 2017-10-09 LAB — IRON,TIBC AND FERRITIN PANEL
%SAT: 16 % (calc) (ref 11–50)
Ferritin: 15 ng/mL — ABNORMAL LOW (ref 20–288)
Iron: 59 ug/dL (ref 45–160)
TIBC: 369 mcg/dL (calc) (ref 250–450)

## 2017-10-14 DIAGNOSIS — M25561 Pain in right knee: Secondary | ICD-10-CM | POA: Diagnosis not present

## 2017-11-11 ENCOUNTER — Ambulatory Visit (INDEPENDENT_AMBULATORY_CARE_PROVIDER_SITE_OTHER): Payer: PPO | Admitting: Family Medicine

## 2017-11-11 ENCOUNTER — Encounter: Payer: Self-pay | Admitting: Family Medicine

## 2017-11-11 VITALS — BP 120/70 | HR 89 | Temp 98.6°F | Ht 62.0 in | Wt 202.6 lb

## 2017-11-11 DIAGNOSIS — R002 Palpitations: Secondary | ICD-10-CM

## 2017-11-11 DIAGNOSIS — S1096XA Insect bite of unspecified part of neck, initial encounter: Secondary | ICD-10-CM | POA: Diagnosis not present

## 2017-11-11 DIAGNOSIS — I10 Essential (primary) hypertension: Secondary | ICD-10-CM | POA: Diagnosis not present

## 2017-11-11 DIAGNOSIS — D500 Iron deficiency anemia secondary to blood loss (chronic): Secondary | ICD-10-CM

## 2017-11-11 DIAGNOSIS — W57XXXA Bitten or stung by nonvenomous insect and other nonvenomous arthropods, initial encounter: Secondary | ICD-10-CM

## 2017-11-11 DIAGNOSIS — G4730 Sleep apnea, unspecified: Secondary | ICD-10-CM | POA: Diagnosis not present

## 2017-11-11 DIAGNOSIS — R011 Cardiac murmur, unspecified: Secondary | ICD-10-CM | POA: Diagnosis not present

## 2017-11-11 DIAGNOSIS — K219 Gastro-esophageal reflux disease without esophagitis: Secondary | ICD-10-CM

## 2017-11-11 DIAGNOSIS — L719 Rosacea, unspecified: Secondary | ICD-10-CM | POA: Diagnosis not present

## 2017-11-11 HISTORY — DX: Palpitations: R00.2

## 2017-11-11 LAB — COMPREHENSIVE METABOLIC PANEL
ALBUMIN: 4.1 g/dL (ref 3.5–5.2)
ALK PHOS: 63 U/L (ref 39–117)
ALT: 21 U/L (ref 0–35)
AST: 19 U/L (ref 0–37)
BILIRUBIN TOTAL: 0.4 mg/dL (ref 0.2–1.2)
BUN: 19 mg/dL (ref 6–23)
CALCIUM: 9.4 mg/dL (ref 8.4–10.5)
CO2: 29 mEq/L (ref 19–32)
Chloride: 102 mEq/L (ref 96–112)
Creatinine, Ser: 1.04 mg/dL (ref 0.40–1.20)
GFR: 55.63 mL/min — AB (ref >60.00)
GLUCOSE: 99 mg/dL (ref 70–99)
Potassium: 3.6 mEq/L (ref 3.5–5.1)
Sodium: 139 mEq/L (ref 135–145)
TOTAL PROTEIN: 7.4 g/dL (ref 6.0–8.3)

## 2017-11-11 LAB — CBC
HCT: 36.1 % (ref 36.0–46.0)
Hemoglobin: 12.2 g/dL (ref 12.0–15.0)
MCHC: 33.8 g/dL (ref 30.0–36.0)
MCV: 87.5 fl (ref 78.0–100.0)
PLATELETS: 211 10*3/uL (ref 150.0–400.0)
RBC: 4.12 Mil/uL (ref 3.87–5.11)
RDW: 15.3 % (ref 11.5–15.5)
WBC: 6.5 10*3/uL (ref 4.0–10.5)

## 2017-11-11 LAB — TSH: TSH: 1.5 u[IU]/mL (ref 0.35–4.50)

## 2017-11-11 NOTE — Assessment & Plan Note (Signed)
She will remain on iron.  Her counts have been good recently.  Check CBC today.

## 2017-11-11 NOTE — Assessment & Plan Note (Signed)
Undetermined cause.  EKG performed today.  Lab work as outlined below.  Send to cardiology.  Given return precautions.

## 2017-11-11 NOTE — Assessment & Plan Note (Signed)
Rash seems consistent with rosacea.  Will refer to dermatology.

## 2017-11-11 NOTE — Assessment & Plan Note (Signed)
Noted on exam today.  She does have some intermittent lightheadedness which seems to be orthostatic.  Will refer to cardiology to consider echo.

## 2017-11-11 NOTE — Patient Instructions (Signed)
Nice to see you. We are going to refer you to cardiology.  We will also refer you to dermatology.  We will obtain lab work today. Please continue your Prilosec. When you would like to see sleep specialist please let us know.

## 2017-11-11 NOTE — Assessment & Plan Note (Signed)
Appears to be well-healing.  No signs of infection.  She will monitor.

## 2017-11-11 NOTE — Progress Notes (Signed)
Tommi Rumps, MD Phone: [phone]  Gitty Osterlund is a 72 y.o. female who presents today for f/u.  CC: gerd, palpitations, rash, osa  GERD:   Reflux symptoms: none as long as she takes her medications   Abd pain: no   Blood in stool: no    EGD: last year  Medication: taking omeprazole Has continued to take the iron.  She takes diclofenac for arthritis.  Notes she hurts all over.  Her knees have been bothering her the most recently.  She did see orthopedics recently and they did a steroid injection into her knees which has helped.  She reports rash that intermittently comes and goes on her nose.  She tried Lotrimin with no benefit.  It itches.  Occasionally it is sore.  No new medications.  She would like to see dermatology.  OSA: Is supposed to be on BiPAP though could never tolerate it.  She does note some hypersomnia during the day.  She does wake up well rested.  She wants to defer evaluation with a sleep specialist for this.  Heart murmur noted on exam.  She notes no history of this.  Occasionally she will feel lightheaded when she bends over and stands up.  She notes occasional palpitations feels like her heart skips a beat.  No chest pain.  Occasionally feels like she needs to take a deep breath.  She reports a tick bite on the right side of her neck about a month ago.  Notes it did swell up though she used hydrocortisone on it and it went down.  Tick was small.  She feels well.  No fevers.   Social History   Tobacco Use  Smoking Status Former Smoker  . Packs/day: 2.00  . Years: 16.00  . Pack years: 32.00  . Types: Cigarettes  . Last attempt to quit: 09/29/1978  . Years since quitting: 39.1  Smokeless Tobacco Never Used     ROS see history of present illness  Objective  Physical Exam Vitals:   11/11/17 1048  BP: 120/70  Pulse: 89  Temp: 98.6 F (37 C)  SpO2: 98%   Laying blood pressure 172/92 pulse 91 Sitting blood pressure 172/90 pulse  87 Standing blood pressure 172/100 pulse 93  BP Readings from Last 3 Encounters:  11/11/17 120/70  08/24/17 122/64  08/11/17 (!) 150/86   Wt Readings from Last 3 Encounters:  11/11/17 202 lb 9.6 oz (91.9 kg)  08/24/17 203 lb 6.4 oz (92.3 kg)  08/11/17 204 lb 6.4 oz (92.7 kg)    Physical Exam  Constitutional: No distress.  Cardiovascular: Normal rate and regular rhythm. Exam reveals no gallop and no friction rub.  Murmur (2/6 systolic ejection murmur at right upper sternal border) heard. Pulmonary/Chest: Effort normal and breath sounds normal.  Musculoskeletal: She exhibits no edema.  Neurological: She is alert.  Skin: Skin is warm and dry. She is not diaphoretic.  Rash over nose consistent with rosacea, area of tick bite appears to be well healed   EKG: There is artifact in a number of the leads, appears to be sinus rhythm with rate of 95, right atrial enlargement, no apparent ischemic changes or arrhythmias  Assessment/Plan: Please see individual problem list.  Sleep apnea Discussed referral to sleep specialist though she would like to defer this until she sees cardiology and dermatology due to cost concerns.  GERD (gastroesophageal reflux disease) Seems to be well controlled.  She will continue on Prilosec particularly since she will continue on  diclofenac.  The patient notes that GI advised that she could stay on the diclofenac as long as she takes Prilosec.  Rosacea Rash seems consistent with rosacea.  Will refer to dermatology.  Iron deficiency anemia due to chronic blood loss She will remain on iron.  Her counts have been good recently.  Check CBC today.  Palpitations Undetermined cause.  EKG performed today.  Lab work as outlined below.  Send to cardiology.  Given return precautions.  Heart murmur Noted on exam today.  She does have some intermittent lightheadedness which seems to be orthostatic.  Will refer to cardiology to consider echo.  Essential  hypertension Elevated on orthostatic check though normal initially.  Improved to 140/84 on recheck.  We will continue to monitor.  Tick bite of neck Appears to be well-healing.  No signs of infection.  She will monitor.    Orders Placed This Encounter  Procedures  . Comp Met (CMET)  . TSH  . CBC  . Ambulatory referral to Cardiology    Referral Priority:   Routine    Referral Type:   Consultation    Referral Reason:   Specialty Services Required    Requested Specialty:   Cardiology    Number of Visits Requested:   1  . Ambulatory referral to Dermatology    Referral Priority:   Routine    Referral Type:   Consultation    Referral Reason:   Specialty Services Required    Requested Specialty:   Dermatology    Number of Visits Requested:   1  . EKG 12-Lead    No orders of the defined types were placed in this encounter.    Tommi Rumps, MD [place]

## 2017-11-11 NOTE — Assessment & Plan Note (Signed)
Seems to be well controlled.  She will continue on Prilosec particularly since she will continue on diclofenac.  The patient notes that GI advised that she could stay on the diclofenac as long as she takes Prilosec.

## 2017-11-11 NOTE — Assessment & Plan Note (Addendum)
Elevated on orthostatic check though normal initially.  Improved to 140/84 on recheck.  We will continue to monitor.

## 2017-11-11 NOTE — Assessment & Plan Note (Signed)
Discussed referral to sleep specialist though she would like to defer this until she sees cardiology and dermatology due to cost concerns.

## 2017-11-27 ENCOUNTER — Other Ambulatory Visit: Payer: Self-pay | Admitting: Family Medicine

## 2017-12-01 ENCOUNTER — Telehealth: Payer: Self-pay | Admitting: Family Medicine

## 2017-12-01 NOTE — Telephone Encounter (Signed)
Already filled in another encounter

## 2017-12-01 NOTE — Telephone Encounter (Signed)
Copied from [place] [id number]. Topic: Quick Communication - Rx Refill/Question >> Dec 01, 2017  9:25 AM Cecelia Byars, NT wrote: Medication: diclofenac (VOLTAREN) 75 MG EC tablet  Has the patient contacted their pharmacy? yes  (Agent: If no, request that the patient contact the pharmacy for the refill. (Agent: If yes, when and what did the pharmacy advise?  Preferred Pharmacy (with phone number or street name): [facility] [address], [place] - [place] [phone] (Phone) [phone] (Fax)      Agent: Please be advised that RX refills may take up to 3 business days. We ask that you follow-up with your pharmacy.

## 2017-12-16 DIAGNOSIS — Z85828 Personal history of other malignant neoplasm of skin: Secondary | ICD-10-CM | POA: Diagnosis not present

## 2017-12-16 DIAGNOSIS — D485 Neoplasm of uncertain behavior of skin: Secondary | ICD-10-CM | POA: Diagnosis not present

## 2017-12-16 DIAGNOSIS — L719 Rosacea, unspecified: Secondary | ICD-10-CM | POA: Diagnosis not present

## 2017-12-16 DIAGNOSIS — L918 Other hypertrophic disorders of the skin: Secondary | ICD-10-CM | POA: Diagnosis not present

## 2018-01-21 DIAGNOSIS — L821 Other seborrheic keratosis: Secondary | ICD-10-CM | POA: Diagnosis not present

## 2018-01-21 DIAGNOSIS — L820 Inflamed seborrheic keratosis: Secondary | ICD-10-CM | POA: Diagnosis not present

## 2018-01-21 DIAGNOSIS — D485 Neoplasm of uncertain behavior of skin: Secondary | ICD-10-CM | POA: Diagnosis not present

## 2018-01-21 DIAGNOSIS — L719 Rosacea, unspecified: Secondary | ICD-10-CM | POA: Diagnosis not present

## 2018-01-21 DIAGNOSIS — Z85828 Personal history of other malignant neoplasm of skin: Secondary | ICD-10-CM | POA: Diagnosis not present

## 2018-01-21 DIAGNOSIS — L814 Other melanin hyperpigmentation: Secondary | ICD-10-CM | POA: Diagnosis not present

## 2018-01-21 DIAGNOSIS — Z1283 Encounter for screening for malignant neoplasm of skin: Secondary | ICD-10-CM | POA: Diagnosis not present

## 2018-01-21 DIAGNOSIS — D1800 Hemangioma unspecified site: Secondary | ICD-10-CM | POA: Diagnosis not present

## 2018-01-22 ENCOUNTER — Encounter: Payer: Self-pay | Admitting: *Deleted

## 2018-02-01 NOTE — Progress Notes (Signed)
Cardiology Office Note  Date:  02/02/2018   ID:  Madison, Craig Oct 15, 1945, MRN [id number]  PCP:  Leone Haven, MD   Chief Complaint  Patient presents with  . other    Heart murmur, sob and heart palpitations. Meds reviewed verbally with pt.    HPI:  [name] is a pleasant 72 year old woman with past medical history of arthritis Osa, not on CPAP Smoker, 16 years obesity Referred by Dr. 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  It is either from sleep apnea or anxiety  She is nondiabetic Prior smoking history but stopped in 1983 Does not take a cholesterol medication with recent total cholesterol 160  Trying to watch her diet  EKG personally reviewed by myself on todays visit Shows normal sinus rhythm rate 74 bpm no significant ST or T wave changes  PMH:   has a past medical history of Anemia, Arthritis, Dyspnea (09/28/2013), GERD (gastroesophageal reflux disease), Heart murmur, Macular degeneration (01/11/2014), Multiple thyroid nodules, Osteoporosis (09/18/2014), and SLEEP APNEA (03/22/2009).  PSH:    Past Surgical History:  Procedure Laterality Date  . BREAST SURGERY  1988   breast biopsy  . CARDIAC CATHETERIZATION  2006   No Stents/[facility]  . COLONOSCOPY WITH PROPOFOL N/A 04/24/2017   Procedure: COLONOSCOPY WITH PROPOFOL;  Surgeon: Virgel Manifold, MD;  Location: [facility] ENDOSCOPY;  Service: Endoscopy;  Laterality: N/A;  . ESOPHAGOGASTRODUODENOSCOPY (EGD) WITH PROPOFOL N/A 04/24/2017   Procedure: ESOPHAGOGASTRODUODENOSCOPY (EGD) WITH PROPOFOL;  Surgeon: Virgel Manifold, MD;  Location: [facility] ENDOSCOPY;  Service: Endoscopy;  Laterality: N/A;    Current Outpatient Medications  Medication Sig Dispense Refill  . diclofenac (VOLTAREN) 75 MG EC tablet TAKE 1 TABLET BY MOUTH TWICE DAILY AS NEEDED 60 tablet 1  . doxycycline (PERIOSTAT) 20 MG tablet Take 20 mg by mouth 2 (two) times daily with a meal.  2  . ferrous sulfate (FERROUSUL) 325 (65 FE) MG tablet Take 1 tablet (325 mg total) by mouth 2 (two) times daily. 60 tablet 3  . omeprazole (PRILOSEC) 40 MG capsule Take 1 capsule (40 mg total) by mouth daily. 90 capsule 3   No current facility-administered medications for this visit.      Allergies:   Patient has no known allergies.   Social History:  The patient  reports that she quit smoking about 39 years ago. Her smoking use included cigarettes. She has a 32.00 pack-year smoking history. She has never used smokeless tobacco. She reports that she does not drink alcohol or use drugs.   Family History:   family history includes Alcohol abuse in her sister; Arrhythmia in her brother; Dementia in her mother; Diabetes (age of onset: 28) in her son; Heart attack in her father and mother; Heart disease (age of onset: 66) in her father; Hyperlipidemia in her father; Hypertension in her father and mother; Stroke in her mother; Stroke (age of onset: 69) in her sister; Sudden death in her father.    Review of Systems: Review of Systems  Constitutional: Negative.  Respiratory: Negative.   Cardiovascular: Positive for palpitations.  Gastrointestinal: Negative.   Musculoskeletal: Negative.   Neurological: Negative.   Psychiatric/Behavioral: The patient is nervous/anxious.   All other systems reviewed and are negative.    PHYSICAL EXAM: VS:  BP (!) 147/82 (BP Location: Right Arm, Patient Position: Sitting, Cuff Size: Large)   Pulse 74   Ht 5\' 2"  (1.575 m)   Wt 202 lb 8 oz (91.9  kg)   BMI 37.04 kg/m  , BMI Body mass index is 37.04 kg/m. GEN: Well nourished, well developed, in no acute distress , obese HEENT: normal  Neck: no JVD, carotid bruits, or masses Cardiac: RRR; 1-2/6 SEM RSB, rubs, or gallops,no edema  Respiratory:  clear to auscultation bilaterally, normal work of breathing GI: soft, nontender, nondistended, + BS MS: no deformity or atrophy  Skin: warm and dry, no rash Neuro:  Strength and sensation are intact Psych: euthymic mood, full affect   Recent Labs: 11/11/2017: ALT 21; BUN 19; Creatinine, Ser 1.04; Hemoglobin 12.2; Platelets 211.0; Potassium 3.6; Sodium 139; TSH 1.50    Lipid Panel Lab Results  Component Value Date   CHOL 164 02/05/2017   HDL 73.90 02/05/2017   LDLCALC 81 02/05/2017   TRIG 49.0 02/05/2017      Wt Readings from Last 3 Encounters:  02/02/18 202 lb 8 oz (91.9 kg)  11/11/17 202 lb 9.6 oz (91.9 kg)  08/24/17 203 lb 6.4 oz (92.3 kg)       ASSESSMENT AND PLAN:  Murmur Low-grade murmur appreciated on exam likely aortic valve sclerosis without significant stenosis As murmur is low-grade and no significant symptoms we will hold off on echocardiogram at this time.  If intensity becomes significantly louder in the next several years could take baseline echocardiogram  Essential hypertension - Plan: EKG 12-Lead Blood pressure is well controlled on today's visit. No changes made to the medications. Not on any pills.  Not needed at this time Recommended low carbohydrate diet with walking  Pure hypercholesterolemia Cholesterol is very reasonable without any cholesterol medication None indicated at this time  Palpitations - Plan: EKG 12-Lead Rare short palpitations sometimes at nighttime likely from anxiety or ectopy She will monitor her pulse and heart rate for now If she notices tachycardia or prolonged episodes of arrhythmia she will call us for placement of event monitor   Total encounter time more than 45  minutes  Greater than 50% was spent in counseling and coordination of care with the patient   Disposition:   F/U as needed   Orders Placed This Encounter  Procedures  . EKG 12-Lead     Signed, Esmond Plants, M.D., Ph.D. 02/02/2018  [place], [place]

## 2018-02-02 ENCOUNTER — Encounter: Payer: Self-pay | Admitting: Cardiovascular Disease

## 2018-02-02 ENCOUNTER — Ambulatory Visit: Payer: PPO | Admitting: Cardiovascular Disease

## 2018-02-02 VITALS — BP 147/82 | HR 74 | Ht 62.0 in | Wt 202.5 lb

## 2018-02-02 DIAGNOSIS — R42 Dizziness and giddiness: Secondary | ICD-10-CM | POA: Diagnosis not present

## 2018-02-02 DIAGNOSIS — I10 Essential (primary) hypertension: Secondary | ICD-10-CM

## 2018-02-02 DIAGNOSIS — R011 Cardiac murmur, unspecified: Secondary | ICD-10-CM | POA: Diagnosis not present

## 2018-02-02 DIAGNOSIS — E7800 Pure hypercholesterolemia, unspecified: Secondary | ICD-10-CM | POA: Diagnosis not present

## 2018-02-02 DIAGNOSIS — R002 Palpitations: Secondary | ICD-10-CM

## 2018-02-02 NOTE — Patient Instructions (Signed)

## 2018-03-01 ENCOUNTER — Ambulatory Visit (INDEPENDENT_AMBULATORY_CARE_PROVIDER_SITE_OTHER): Payer: PPO

## 2018-03-01 ENCOUNTER — Ambulatory Visit (INDEPENDENT_AMBULATORY_CARE_PROVIDER_SITE_OTHER): Payer: PPO | Admitting: Family Medicine

## 2018-03-01 ENCOUNTER — Encounter: Payer: Self-pay | Admitting: Family Medicine

## 2018-03-01 VITALS — BP 140/90 | HR 73 | Temp 98.7°F | Ht 62.0 in | Wt 202.2 lb

## 2018-03-01 DIAGNOSIS — M25521 Pain in right elbow: Secondary | ICD-10-CM

## 2018-03-01 DIAGNOSIS — K219 Gastro-esophageal reflux disease without esophagitis: Secondary | ICD-10-CM

## 2018-03-01 DIAGNOSIS — M79601 Pain in right arm: Secondary | ICD-10-CM | POA: Diagnosis not present

## 2018-03-01 DIAGNOSIS — J309 Allergic rhinitis, unspecified: Secondary | ICD-10-CM

## 2018-03-01 DIAGNOSIS — I10 Essential (primary) hypertension: Secondary | ICD-10-CM | POA: Diagnosis not present

## 2018-03-01 DIAGNOSIS — M1990 Unspecified osteoarthritis, unspecified site: Secondary | ICD-10-CM

## 2018-03-01 DIAGNOSIS — L719 Rosacea, unspecified: Secondary | ICD-10-CM | POA: Diagnosis not present

## 2018-03-01 DIAGNOSIS — G8929 Other chronic pain: Secondary | ICD-10-CM | POA: Diagnosis not present

## 2018-03-01 DIAGNOSIS — D500 Iron deficiency anemia secondary to blood loss (chronic): Secondary | ICD-10-CM | POA: Diagnosis not present

## 2018-03-01 DIAGNOSIS — S59901A Unspecified injury of right elbow, initial encounter: Secondary | ICD-10-CM | POA: Diagnosis not present

## 2018-03-01 DIAGNOSIS — M25511 Pain in right shoulder: Secondary | ICD-10-CM | POA: Diagnosis not present

## 2018-03-01 DIAGNOSIS — S4991XA Unspecified injury of right shoulder and upper arm, initial encounter: Secondary | ICD-10-CM | POA: Diagnosis not present

## 2018-03-01 LAB — CBC
HEMATOCRIT: 37.2 % (ref 36.0–46.0)
Hemoglobin: 12.5 g/dL (ref 12.0–15.0)
MCHC: 33.7 g/dL (ref 30.0–36.0)
MCV: 88.7 fl (ref 78.0–100.0)
Platelets: 235 10*3/uL (ref 150.0–400.0)
RBC: 4.19 Mil/uL (ref 3.87–5.11)
RDW: 13.4 % (ref 11.5–15.5)
WBC: 7.7 10*3/uL (ref 4.0–10.5)

## 2018-03-01 MED ORDER — OMEPRAZOLE 40 MG PO CPDR: 40.0000 mg | DELAYED_RELEASE_CAPSULE | Freq: Every day | ORAL | 3 refills | Status: DC

## 2018-03-01 MED ORDER — DICLOFENAC SODIUM 75 MG PO TBEC: 75.0000 mg | DELAYED_RELEASE_TABLET | Freq: Two times a day (BID) | ORAL | 1 refills | Status: DC | PRN

## 2018-03-01 NOTE — Progress Notes (Signed)
Tommi Rumps, MD Phone: [phone]  Madison Craig is a 72 y.o. female who presents today for f/u.  CC: Osteoarthritis, GERD, rosacea, allergic rhinitis, hypertension, fall  Osteoarthritis: Taking diclofenac once daily.  Does help some.  It is mostly her right shoulder and elbow as well as her right knee that bother her.  She did see orthopedics in the past and they recommended a knee replacement.  GERD: Taking omeprazole.  No reflux symptoms.  No abdominal pain, blood in her stool, or dysphagia.  Iron deficiency anemia: Anemia had resolved.  Iron stores are still low.  She has a hard time remembering to take her iron.  She does not want to do the capsule endoscopy.  She has been evaluated by GI.  Rosacea: She saw dermatology and they confirmed this.  She could not afford the cream.  She stopped the doxycycline as she felt that made her symptoms worse.  Allergic rhinitis: She does note chronic sinus congestion and drainage with some sneezing.  Possibly diagnosed with mild asthma in the past though has not had to use albuterol.  Occasional cough.  She does not use Flonase or Zyrtec consistently.  Hypertension: Typically runs 128/77 at home.  No chest pain or shortness of breath.  Patient had a fall yesterday in her house that she was walking and tripped on a rug.  She landed on her right elbow.  It has been sore since then.  She notes some chronic right shoulder and upper arm pain as well.  She had no head injury or loss of consciousness.  She notes no other falls in the last year.  Social History   Tobacco Use  Smoking Status Former Smoker  . Packs/day: 2.00  . Years: 16.00  . Pack years: 32.00  . Types: Cigarettes  . Last attempt to quit: 09/29/1978  . Years since quitting: 39.4  Smokeless Tobacco Never Used     ROS see history of present illness  Objective  Physical Exam Vitals:   03/01/18 1007  BP: 140/90  Pulse: 73  Temp: 98.7 F (37.1 C)  SpO2: 95%     BP Readings from Last 3 Encounters:  03/01/18 140/90  02/02/18 (!) 147/82  11/11/17 120/70   Wt Readings from Last 3 Encounters:  03/01/18 202 lb 3.2 oz (91.7 kg)  02/02/18 202 lb 8 oz (91.9 kg)  11/11/17 202 lb 9.6 oz (91.9 kg)    Physical Exam  Constitutional: No distress.  HENT:  Head: Normocephalic and atraumatic.  Mouth/Throat: Oropharynx is clear and moist. No oropharyngeal exudate.  Cardiovascular: Normal rate, regular rhythm and normal heart sounds.  Pulmonary/Chest: Effort normal and breath sounds normal.  Musculoskeletal: She exhibits no edema.  Patient does have some tenderness over her right elbow, tenderness over her right shoulder as well with decreased range of motion in her right shoulder, extremity appears warm and well perfused  Neurological: She is alert.  Skin: Skin is warm and dry. She is not diaphoretic.     Assessment/Plan: Please see individual problem list.  Allergic rhinitis Upper respiratory symptoms most likely related to allergies.  Encourage Flonase use.  If not improving she can go back and see ENT.  GERD (gastroesophageal reflux disease) Asymptomatic.  She will remain on omeprazole.  Osteoarthritis Patient will remain on diclofenac currently.  We will x-ray her right elbow, right humerus, and right shoulder given her recent fall and pain.  Discussed fall precautions at home.  She is already removed drugs.  We  will contact her with her x-ray results.  Essential hypertension Adequately controlled at home.  She will continue to monitor.  Rosacea She has seen dermatology.  She will continue to monitor.  Iron deficiency anemia due to chronic blood loss She will try to remember to take her iron.  We will recheck labs.   Orders Placed This Encounter  Procedures  . DG Elbow Complete Right    Standing Status:   Future    Number of Occurrences:   1    Standing Expiration Date:   05/02/2019    Order Specific Question:   Reason for Exam  (SYMPTOM  OR DIAGNOSIS REQUIRED)    Answer:   fall on to right elbow yesterday, continued pain, bony tenderness    Order Specific Question:   Preferred imaging location?    Answer:   [facility] Specific Question:   Radiology Contrast Protocol - do NOT remove file path    Answer:   \\[email or web address].[email or web address]  . DG Shoulder Right    Standing Status:   Future    Number of Occurrences:   1    Standing Expiration Date:   05/02/2019    Order Specific Question:   Reason for Exam (SYMPTOM  OR DIAGNOSIS REQUIRED)    Answer:   right shoulder pain and tenderness, chronic, had fall yesterday    Order Specific Question:   Preferred imaging location?    Answer:   [facility] Specific Question:   Radiology Contrast Protocol - do NOT remove file path    Answer:   \\[email or web address].[email or web address]  . DG Humerus Right    Standing Status:   Future    Number of Occurrences:   1    Standing Expiration Date:   05/02/2019    Order Specific Question:   Reason for Exam (SYMPTOM  OR DIAGNOSIS REQUIRED)    Answer:   pain over right humerus and right elbow after fall on to right elbow yesterday    Order Specific Question:   Preferred imaging location?    Answer:   [facility] Specific Question:   Radiology Contrast Protocol - do NOT remove file path    Answer:   \\[email or web address]  . CBC  . Iron, TIBC and Ferritin Panel    Meds ordered this encounter  Medications  . diclofenac (VOLTAREN) 75 MG EC tablet    Sig: Take 1 tablet (75 mg total) by mouth 2 (two) times daily as needed.    Dispense:  60 tablet    Refill:  1    Please consider 90 day supplies to promote better adherence  . omeprazole (PRILOSEC) 40 MG capsule    Sig: Take 1 capsule (40 mg total) by mouth daily.    Dispense:  90 capsule    Refill:  [place],  MD [place]

## 2018-03-01 NOTE — Patient Instructions (Signed)
Nice to see you. Please continue on your omeprazole. Please try Flonase for your sinus issues. Please continue to monitor your blood pressure and if it starts to trend up at home please let us know. Please remove the rugs from your house and be careful when walking. We will get x-rays today and lab work today and contact you with the results.

## 2018-03-02 LAB — IRON,TIBC AND FERRITIN PANEL
%SAT: 14 % (calc) — ABNORMAL LOW (ref 16–45)
Ferritin: 22 ng/mL (ref 16–288)
Iron: 50 ug/dL (ref 45–160)
TIBC: 365 ug/dL (ref 250–450)

## 2018-03-04 NOTE — Assessment & Plan Note (Signed)
Upper respiratory symptoms most likely related to allergies.  Encourage Flonase use.  If not improving she can go back and see ENT.

## 2018-03-04 NOTE — Assessment & Plan Note (Signed)
Asymptomatic.  She will remain on omeprazole.

## 2018-03-04 NOTE — Assessment & Plan Note (Signed)
Patient will remain on diclofenac currently.  We will x-ray her right elbow, right humerus, and right shoulder given her recent fall and pain.  Discussed fall precautions at home.  She is already removed drugs.  We will contact her with her x-ray results.

## 2018-03-04 NOTE — Assessment & Plan Note (Signed)
She has seen dermatology.  She will continue to monitor.

## 2018-03-04 NOTE — Assessment & Plan Note (Signed)
She will try to remember to take her iron.  We will recheck labs.

## 2018-03-04 NOTE — Assessment & Plan Note (Signed)
Adequately controlled at home.  She will continue to monitor. 

## 2018-05-10 DIAGNOSIS — M25561 Pain in right knee: Secondary | ICD-10-CM | POA: Diagnosis not present

## 2018-07-30 ENCOUNTER — Other Ambulatory Visit: Payer: Self-pay | Admitting: Family Medicine

## 2018-08-02 NOTE — Telephone Encounter (Signed)
Last OV 03/01/2018   Last refilled 03/02/2019 disp 60 with 1 refill   Sent to PCP for approval

## 2018-08-04 ENCOUNTER — Encounter: Payer: Self-pay | Admitting: Family Medicine

## 2018-08-25 ENCOUNTER — Telehealth: Payer: Self-pay

## 2018-08-25 NOTE — Telephone Encounter (Signed)
Copied from [place] [phone]. Topic: General - Other >> Aug 25, 2018  3:23 PM Valla Leaver wrote: Reason for CRM: patient ok with telemed visit tomorrow.

## 2018-08-26 ENCOUNTER — Ambulatory Visit: Payer: Self-pay

## 2018-08-26 NOTE — Telephone Encounter (Signed)
Sent to [facility] as an FYI you appt today at 10 AM for AWV

## 2018-08-27 ENCOUNTER — Ambulatory Visit (INDEPENDENT_AMBULATORY_CARE_PROVIDER_SITE_OTHER): Payer: PPO

## 2018-08-27 ENCOUNTER — Other Ambulatory Visit: Payer: Self-pay

## 2018-08-27 DIAGNOSIS — E860 Dehydration: Secondary | ICD-10-CM | POA: Diagnosis not present

## 2018-08-27 DIAGNOSIS — Z0000 Encounter for general adult medical examination without abnormal findings: Secondary | ICD-10-CM

## 2018-08-27 NOTE — Progress Notes (Signed)
Subjective:   Madison Craig is a 73 y.o. female who presents for Medicare Annual (Subsequent) preventive examination.  Review of Systems:  No ROS.  Medicare Wellness Visit. Additional risk factors are reflected in the social history. Cardiac Risk Factors include: advanced age (>63mn, >>50women);hypertension     Objective:     Vitals: There were no vitals taken for this visit.  There is no height or weight on file to calculate BMI.   Vital signs UTA, virtual visit.  Advanced Directives 08/27/2018 08/24/2017 04/24/2017 08/21/2016 07/13/2015 02/05/2015 12/15/2014  Does Patient Have a Medical Advance Directive? _0  No No  Would patient like information on creating a medical advance directive? Yes (MAU/Ambulatory/Procedural Areas - Information given) Yes (MAU/Ambulatory/Procedural Areas - Information given) - No - Patient declined No - patient declined information Yes - Educational materials given -    Tobacco Social History   Tobacco Use  Smoking Status Former Smoker  . Packs/day: 2.00  . Years: 16.00  . Pack years: 32.00  . Types: Cigarettes  . Last attempt to quit: 09/29/1978  . Years since quitting: 39.9  Smokeless Tobacco Never Used     Counseling given: Not Answered   Clinical Intake:  Pre-visit preparation completed: Yes        Diabetes: No  How often do you need to have someone help you when you read instructions, pamphlets, or other written materials from your doctor or pharmacy?: 1 - Never  Interpreter Needed?: No    Past Medical History:  Diagnosis Date  . Anemia   . Arthritis   . Dyspnea 09/28/2013   Pulmonary function testing done on 02/09/2014 showed minimal airway obstruction with a lack of response to bronchodilators; her FEV1 was normal, the FEV1/FVC ratio and the FEF 25-75% were reduced; the airway resistance was normal; lung volumes were within normal limits; the diffusing capacity was high for the measured volumes.    .Marland KitchenGERD  (gastroesophageal reflux disease)   . Heart murmur   . Macular degeneration 01/11/2014   [facility] [place]   . Multiple thyroid nodules    Thyroid ultrasound on 01/25/2014 showed a multinodular goiter, with a 1.5 cm nodule in the inferior left thyroid lobe that met the criteria for ultrasound-guided biopsy.  On 02/06/2014 an ultrasound guided needle aspirate biopsy was performed of the left inferior thyroid nodule; cytopathology findings were consistent with non-neoplastic goiter.     . Osteoporosis 09/18/2014   DXA 08/16/2014 showed osteopenia of right femur neck (T-score -2.3) and osteoporosis of left forearm radius (T-score -2.9).  Lumbar spine was not utilized due to advanced degenerative changes.   .Marland KitchenSLEEP APNEA 03/22/2009   [place], cannot sleep with it on.     Past Surgical History:  Procedure Laterality Date  . BREAST SURGERY  1988   breast biopsy  . CARDIAC CATHETERIZATION  2006   No Stents/[facility]  . COLONOSCOPY WITH PROPOFOL N/A 04/24/2017   Procedure: COLONOSCOPY WITH PROPOFOL;  Surgeon: TVirgel Manifold MD;  Location: [facility] ENDOSCOPY;  Service: Endoscopy;  Laterality: N/A;  . ESOPHAGOGASTRODUODENOSCOPY (EGD) WITH PROPOFOL N/A 04/24/2017   Procedure: ESOPHAGOGASTRODUODENOSCOPY (EGD) WITH PROPOFOL;  Surgeon: TVirgel Manifold MD;  Location: [facility] ENDOSCOPY;  Service: Endoscopy;  Laterality: N/A;   Family History  Problem Relation Age of Onset  . Heart disease Father 43 . Sudden death Father   . Hypertension Father   .  Hyperlipidemia Father   . Heart attack Father   . Diabetes Son 19  . Stroke Mother   . Hypertension Mother   . Dementia Mother   . Heart attack Mother   . Stroke Sister 80  . Alcohol abuse Sister   . Arrhythmia Brother   . Colon cancer Neg Hx   . Breast cancer Neg Hx    Social History   Socioeconomic History  . Marital status: Widowed    Spouse name: Not on file  .  Number of children: 3  . Years of education: Not on file  . Highest education level: Not on file  Occupational History  . Not on file  Social Needs  . Financial resource strain: Not hard at all  . Food insecurity:    Worry: Never true    Inability: Never true  . Transportation needs:    Medical: No    Non-medical: No  Tobacco Use  . Smoking status: Former Smoker    Packs/day: 2.00    Years: 16.00    Pack years: 32.00    Types: Cigarettes    Last attempt to quit: 09/29/1978    Years since quitting: 39.9  . Smokeless tobacco: Never Used  Substance and Sexual Activity  . Alcohol use: No  . Drug use: No  . Sexual activity: Never  Lifestyle  . Physical activity:    Days per week: 5 days    Minutes per session: 30 min  . Stress: Not at all  Relationships  . Social connections:    Talks on phone: Not on file    Gets together: Not on file    Attends religious service: Not on file    Active member of club or organization: Not on file    Attends meetings of clubs or organizations: Not on file    Relationship status: Not on file  Other Topics Concern  . Not on file  Social History Narrative  . Not on file    Outpatient Encounter Medications as of 08/27/2018  Medication Sig  . diclofenac (VOLTAREN) 75 MG EC tablet Take 1 tablet by mouth twice daily as needed  . doxycycline (PERIOSTAT) 20 MG tablet Take 20 mg by mouth 2 (two) times daily with a meal.  . omeprazole (PRILOSEC) 40 MG capsule Take 1 capsule (40 mg total) by mouth daily.  . ferrous sulfate (FERROUSUL) 325 (65 FE) MG tablet Take 1 tablet (325 mg total) by mouth 2 (two) times daily.   No facility-administered encounter medications on file as of 08/27/2018.     Activities of Daily Living In your present state of health, do you have any difficulty performing the following activities: 08/27/2018  Hearing? Y  Vision? N  Difficulty concentrating or making decisions? N  Walking or climbing stairs? N  Dressing or bathing? N   Doing errands, shopping? N  Preparing Food and eating ? N  Using the Toilet? N  In the past six months, have you accidently leaked urine? N  Do you have problems with loss of bowel control? N  Managing your Medications? N  Managing your Finances? N  Housekeeping or managing your Housekeeping? N  Some recent data might be hidden    Patient Care Team: Leone Haven, MD as PCP - General (Family Medicine)    Assessment:   This is a routine wellness examination for Keandrea.  I connected with Ivin Booty on 08/27/18 at  9:00 AM EDT by a video enabled telemedicine application and verified  that I am speaking with the correct person using two identifiers.  Keep all routine scheduled appointments.  Next scheduled OV with pcp 09/01/18.  Notes no acute issues to follow.   Health Screenings  Mammogram -06/15/16 Colonoscopy -06/24/16 Bone Density -08/19/14 Osteoporosis.  Glaucoma -none Macular Degeneration -yes Hearing -hearing aid Hemoglobin A1C -02/05/17 (6.0) Cholesterol -02/05/17 (164) Dental -dentures Vision- she plans to schedule. Wears readers only.   Social  Alcohol intake -no Smoking history- former Smokers in home? Yes. Grandson.  Illicit drug use? none Exercise- walking Diet -regular Sexually Active -no Employed- yes, currently works at a daycare facility.   Safety  Patient feels safe at home. Grandson lives with her.  Patient does have smoke detectors at home  Patient does wear sunscreen or protective clothing when in direct sunlight.  Patient does wear seat belt when driving or riding with others.  Safety rails in shower. Ramp and railing in use when entering the home.  Keeps cell phone with her when traveling in or out of the house.   Activities of Daily Living Patient can do their own household chores. Denies needing assistance with: driving, feeding themselves, getting from bed to chair, getting to the toilet, bathing/showering, dressing, managing money, climbing  flight of stairs, or preparing meals. Daughter assist as needed with managing household finances.   Depression Screen Patient denies losing interest in daily life, feeling hopeless, or crying easily over simple problems.   Fall Screen No falls since last about 6 months ago; followed by visit with pcp.  She is aware of her footing when walking.  Paces herself as needed due to chronic knee pain.  She plans to receive a knee replacement in the near future and does not want to pursue follow up at this time.   Memory Screen Patient notes difficulty remembering names at times. denies problems with, misplacing items, and is able to balance checkbook/bank accounts. Daughter assists with online payments as needed.  Patient is alert, oriented to person/place/and time. Correctly identified the president of the [place], recall of 2/3 objects, and performing simple calculations. Works two puzzles daily to help with brain engagement.   Immunizations The following Immunizations discussed: Influenza, shingles, pneumonia, and tetanus.   Other Providers Patient Care Team: Leone Haven, MD as PCP - General (Family Medicine)  Exercise Activities and Dietary recommendations Current Exercise Habits: Home exercise routine, Type of exercise: walking, Time (Minutes): 30, Frequency (Times/Week): 5, Weekly Exercise (Minutes/Week): 150, Intensity: Mild  Goals      Patient Stated   . Obtain Annual Eye (retinal)  Exam  (pt-stated)       Fall Risk Fall Risk  08/27/2018 03/01/2018 08/24/2017 08/11/2017 07/24/2016  Falls in the past year? 1 Yes No No No  Comment None since last assessed by pcp for fall about 6 months ago.  - - - -  Number falls in past yr: 0 1 - - -  Injury with Fall? - No - - -  Follow up Falls prevention discussed - - - -   Depression Screen PHQ 2/9 Scores 08/27/2018 03/01/2018 08/24/2017 08/11/2017  PHQ - 2 Score 0 0 0 0     Cognitive Function MMSE - Mini Mental State Exam 08/24/2017 08/21/2016   Orientation to time 5 5  Orientation to Place 5 5  Registration 3 3  Attention/ Calculation 5 5  Recall 2 3  Language- name 2 objects 2 2  Language- repeat 1 1  Language- follow 3 step command 3 3  Language- read & follow direction 1 1  Write a sentence 1 1  Copy design 1 1  Total score 29 30     6CIT Screen 08/27/2018  What Year? 0 points  What month? 0 points  What time? 0 points  Count back from 20 0 points  Months in reverse 0 points  Repeat phrase 0 points  Total Score 0    Immunization History  Administered Date(s) Administered  . Influenza,inj,Quad PF,6+ Mos 04/05/2014, 02/05/2015, 01/23/2016  . Pneumococcal Conjugate-13 01/11/2014  . Pneumococcal Polysaccharide-23 12/03/2015  . Tdap 01/11/2014   Screening Tests Health Maintenance  Topic Date Due  . INFLUENZA VACCINE  12/25/2018  . MAMMOGRAM  04/16/2019  . COLONOSCOPY  04/24/2020  . TETANUS/TDAP  01/12/2024  . DEXA SCAN  Completed  . Hepatitis C Screening  Completed  . PNA vac Low Risk Adult  Completed      Plan:    End of life planning; Advance aging; Advanced directives discussed. Copy of current HCPOA/Living Will requested upon completion.    I have personally reviewed and noted the following in the patient's chart:   . Medical and social history . Use of alcohol, tobacco or illicit drugs  . Current medications and supplements . Functional ability and status . Nutritional status . Physical activity . Advanced directives . List of other physicians . Hospitalizations, surgeries, and ER visits in previous 12 months . Vitals . Screenings to include cognitive, depression, and falls . Referrals and appointments  In addition, I have reviewed and discussed with patient certain preventive protocols, quality metrics, and best practice recommendations. A written personalized care plan for preventive services as well as general preventive health recommendations were provided to patient.      Varney Biles, LPN  12/26/3381

## 2018-08-27 NOTE — Patient Instructions (Addendum)
  Ms. Mccready , Thank you for taking time to come for your Medicare Wellness Visit. I appreciate your ongoing commitment to your health goals. Please review the following plan we discussed and let me know if I can assist you in the future.   These are the goals we discussed: Goals      Patient Stated   . Obtain Annual Eye (retinal)  Exam  (pt-stated)       This is a list of the screening recommended for you and due dates:  Health Maintenance  Topic Date Due  . Flu Shot  12/25/2018  . Mammogram  04/16/2019  . Colon Cancer Screening  04/24/2020  . Tetanus Vaccine  01/12/2024  . DEXA scan (bone density measurement)  Completed  .  Hepatitis C: One time screening is recommended by Center for Disease Control  (CDC) for  adults born from [birth year] through 1963.   Completed  . Pneumonia vaccines  Completed    Bring a copy of your [place] and/or Living Will to be scanned into chart once completed.  Have a great day!

## 2018-08-30 NOTE — Progress Notes (Signed)
I have reviewed the above note and agree.  Roxene Alviar, M.D.  

## 2018-09-01 ENCOUNTER — Other Ambulatory Visit: Payer: Self-pay

## 2018-09-01 ENCOUNTER — Ambulatory Visit (INDEPENDENT_AMBULATORY_CARE_PROVIDER_SITE_OTHER): Payer: PPO | Admitting: Family Medicine

## 2018-09-01 ENCOUNTER — Encounter: Payer: Self-pay | Admitting: Family Medicine

## 2018-09-01 ENCOUNTER — Telehealth: Payer: Self-pay | Admitting: Family Medicine

## 2018-09-01 DIAGNOSIS — E042 Nontoxic multinodular goiter: Secondary | ICD-10-CM | POA: Diagnosis not present

## 2018-09-01 DIAGNOSIS — Z1239 Encounter for other screening for malignant neoplasm of breast: Secondary | ICD-10-CM

## 2018-09-01 DIAGNOSIS — M1990 Unspecified osteoarthritis, unspecified site: Secondary | ICD-10-CM

## 2018-09-01 DIAGNOSIS — K219 Gastro-esophageal reflux disease without esophagitis: Secondary | ICD-10-CM | POA: Diagnosis not present

## 2018-09-01 DIAGNOSIS — G4730 Sleep apnea, unspecified: Secondary | ICD-10-CM

## 2018-09-01 DIAGNOSIS — I10 Essential (primary) hypertension: Secondary | ICD-10-CM

## 2018-09-01 MED ORDER — OMEPRAZOLE 40 MG PO CPDR: 40.0000 mg | DELAYED_RELEASE_CAPSULE | Freq: Every day | ORAL | 3 refills | Status: DC

## 2018-09-01 MED ORDER — DICLOFENAC SODIUM 75 MG PO TBEC: 75.0000 mg | DELAYED_RELEASE_TABLET | Freq: Two times a day (BID) | ORAL | 0 refills | Status: DC | PRN

## 2018-09-01 NOTE — Assessment & Plan Note (Signed)
Continue diclofenac.  We will have her come in for lab work.

## 2018-09-01 NOTE — Progress Notes (Signed)
Refill diclofenac and omeprazole 90 day

## 2018-09-01 NOTE — Telephone Encounter (Signed)
Please contact the patient and get her set up for follow-up in 6 months.  Please also get her set up for lab work sometime in the next several weeks.

## 2018-09-01 NOTE — Telephone Encounter (Signed)
Called and spoke with pt. Pt advised and has been scheduled for both appts

## 2018-09-01 NOTE — Assessment & Plan Note (Signed)
Well controlled on omeprazole. Continue current regimen ?

## 2018-09-01 NOTE — Assessment & Plan Note (Signed)
Well-controlled.  She will continue to periodically monitor.

## 2018-09-01 NOTE — Progress Notes (Signed)
Virtual Visit via telephone Note  This visit type was conducted due to national recommendations for restrictions regarding the COVID-19 pandemic (e.g. social distancing).  This format is felt to be most appropriate for this patient at this time.  All issues noted in this document were discussed and addressed.  No physical exam was performed (except for noted visual exam findings with Video Visits).   I connected with Madison Craig on 09/01/18 at 10:30 AM EDT by telephone and verified that I am speaking with the correct person using two identifiers. Location patient: work Location provider: work  Persons participating in the virtual visit: patient, provider  I discussed the limitations, risks, security and privacy concerns of performing an evaluation and management service by telephone and the availability of in person appointments. I also discussed with the patient that there may be a patient responsible charge related to this service. The patient expressed understanding and agreed to proceed.  Interactive audio and video telecommunications were attempted between this provider and patient, however failed, due to patient having technical difficulties OR patient did not have access to video capability.  We continued and completed visit with audio only.  Reason for visit: 6 month follow-up  HPI: GERD:   Reflux symptoms: none as long as she takes her medication   Abd pain: no   Blood in stool: no  Dysphagia: no   EGD: 04/24/17  Medication: omeprazole  Osteoarthritis: Taking diclofenac once daily.  This is beneficial.  She notes her pain is in her knees.  She notes the diclofenac helps her function.  She notes she does need a knee replacement though she cannot take time off from work for this.  Goiter: She has had no neck swelling with this.  She does want to proceed with a thyroid ultrasound once the coronavirus situation has improved.  OSA: She was previously fit with a BiPAP though could  not tolerate it.  She does note she wakes up well rested and does not have any hypersomnia.  Hypertension: Typically running 120s over 70s.  No chest pain or shortness of breath.    ROS: See pertinent positives and negatives per HPI.  Past Medical History:  Diagnosis Date  . Anemia   . Arthritis   . Dyspnea 09/28/2013   Pulmonary function testing done on 02/09/2014 showed minimal airway obstruction with a lack of response to bronchodilators; her FEV1 was normal, the FEV1/FVC ratio and the FEF 25-75% were reduced; the airway resistance was normal; lung volumes were within normal limits; the diffusing capacity was high for the measured volumes.    Marland Kitchen GERD (gastroesophageal reflux disease)   . Heart murmur   . Macular degeneration 01/11/2014   [facility] in [place], [place].   . Multiple thyroid nodules    Thyroid ultrasound on 01/25/2014 showed a multinodular goiter, with a 1.5 cm nodule in the inferior left thyroid lobe that met the criteria for ultrasound-guided biopsy.  On 02/06/2014 an ultrasound guided needle aspirate biopsy was performed of the left inferior thyroid nodule; cytopathology findings were consistent with non-neoplastic goiter.     . Osteoporosis 09/18/2014   DXA 08/16/2014 showed osteopenia of right femur neck (T-score -2.3) and osteoporosis of left forearm radius (T-score -2.9).  Lumbar spine was not utilized due to advanced degenerative changes.   Marland Kitchen SLEEP APNEA 03/22/2009   Had sleep study in [place] about 10 years, has BiPAP, cannot sleep with it on.      Past Surgical History:  Procedure  Laterality Date  . BREAST SURGERY  1988   breast biopsy  . CARDIAC CATHETERIZATION  2006   No Stents/[facility]  . COLONOSCOPY WITH PROPOFOL N/A 04/24/2017   Procedure: COLONOSCOPY WITH PROPOFOL;  Surgeon: Virgel Manifold, MD;  Location: [facility] ENDOSCOPY;  Service: Endoscopy;  Laterality: N/A;  . ESOPHAGOGASTRODUODENOSCOPY (EGD) WITH PROPOFOL N/A 04/24/2017   Procedure:  ESOPHAGOGASTRODUODENOSCOPY (EGD) WITH PROPOFOL;  Surgeon: Virgel Manifold, MD;  Location: [facility] ENDOSCOPY;  Service: Endoscopy;  Laterality: N/A;    Family History  Problem Relation Age of Onset  . Heart disease Father 18  . Sudden death Father   . Hypertension Father   . Hyperlipidemia Father   . Heart attack Father   . Diabetes Son 44  . Stroke Mother   . Hypertension Mother   . Dementia Mother   . Heart attack Mother   . Stroke Sister 52  . Alcohol abuse Sister   . Arrhythmia Brother   . Colon cancer Neg Hx   . Breast cancer Neg Hx     SOCIAL HX: Former smoker.   Current Outpatient Medications:  .  diclofenac (VOLTAREN) 75 MG EC tablet, Take 1 tablet (75 mg total) by mouth 2 (two) times daily as needed., Disp: 60 tablet, Rfl: 0 .  doxycycline (PERIOSTAT) 20 MG tablet, Take 20 mg by mouth 2 (two) times daily with a meal., Disp: , Rfl: 2 .  omeprazole (PRILOSEC) 40 MG capsule, Take 1 capsule (40 mg total) by mouth daily., Disp: 90 capsule, Rfl: 3 .  ferrous sulfate (FERROUSUL) 325 (65 FE) MG tablet, Take 1 tablet (325 mg total) by mouth 2 (two) times daily., Disp: 60 tablet, Rfl: 3  EXAM:  No exam was completed given this was a telehealth telephone visit.  ASSESSMENT AND PLAN:  Discussed the following assessment and plan:  Breast cancer screening - Plan: MM 3D SCREEN BREAST BILATERAL  Essential hypertension - Plan: Basic Metabolic Panel (BMET)  Sleep apnea, unspecified type  Multinodular goiter - Plan: US THYROID  Osteoarthritis, unspecified osteoarthritis type, unspecified site  Gastroesophageal reflux disease, esophagitis presence not specified  Essential hypertension Well-controlled.  She will continue to periodically monitor.  Sleep apnea Consider referral to sleep specialist once the general coronavirus situation is improving.  Multinodular goiter Follow-up ultrasound ordered.  Osteoarthritis Continue diclofenac.  We will have her come in for lab  work.  GERD (gastroesophageal reflux disease) Well-controlled on omeprazole.  Continue current regimen.    I discussed the assessment and treatment plan with the patient. The patient was provided an opportunity to ask questions and all were answered. The patient agreed with the plan and demonstrated an understanding of the instructions.   The patient was advised to call back or seek an in-person evaluation if the symptoms worsen or if the condition fails to improve as anticipated.  I provided 15 minutes of non-face-to-face time during this encounter.   Tommi Rumps, MD

## 2018-09-01 NOTE — Assessment & Plan Note (Signed)
Consider referral to sleep specialist once the general coronavirus situation is improving.

## 2018-09-01 NOTE — Assessment & Plan Note (Signed)
Follow up ultrasound ordered.

## 2018-09-07 ENCOUNTER — Encounter: Payer: Self-pay | Admitting: Family Medicine

## 2018-10-06 ENCOUNTER — Other Ambulatory Visit: Payer: Self-pay

## 2018-10-06 ENCOUNTER — Other Ambulatory Visit (INDEPENDENT_AMBULATORY_CARE_PROVIDER_SITE_OTHER): Payer: PPO

## 2018-10-06 DIAGNOSIS — I10 Essential (primary) hypertension: Secondary | ICD-10-CM | POA: Diagnosis not present

## 2018-10-06 LAB — BASIC METABOLIC PANEL
BUN: 13 mg/dL (ref 6–23)
CO2: 30 mEq/L (ref 19–32)
Calcium: 9.2 mg/dL (ref 8.4–10.5)
Chloride: 101 mEq/L (ref 96–112)
Creatinine, Ser: 0.91 mg/dL (ref 0.40–1.20)
GFR: 60.9 mL/min (ref >60.00)
Glucose, Bld: 95 mg/dL (ref 70–99)
Potassium: 4.4 mEq/L (ref 3.5–5.1)
Sodium: 138 mEq/L (ref 135–145)

## 2018-10-25 ENCOUNTER — Ambulatory Visit: Payer: PPO

## 2018-11-02 ENCOUNTER — Ambulatory Visit
Admission: RE | Admit: 2018-11-02 | Discharge: 2018-11-02 | Disposition: A | Discharge status: Home / Self Care | Payer: PPO | Source: Ambulatory Visit | Attending: Family Medicine | Admitting: Family Medicine

## 2018-11-02 ENCOUNTER — Other Ambulatory Visit: Payer: Self-pay

## 2018-11-02 DIAGNOSIS — E042 Nontoxic multinodular goiter: Secondary | ICD-10-CM

## 2018-11-26 ENCOUNTER — Other Ambulatory Visit: Payer: Self-pay | Admitting: Family Medicine

## 2019-03-09 ENCOUNTER — Other Ambulatory Visit: Payer: Self-pay

## 2019-03-11 ENCOUNTER — Other Ambulatory Visit: Payer: Self-pay

## 2019-03-11 ENCOUNTER — Encounter: Payer: Self-pay | Admitting: Family Medicine

## 2019-03-11 ENCOUNTER — Ambulatory Visit (INDEPENDENT_AMBULATORY_CARE_PROVIDER_SITE_OTHER): Payer: PPO | Admitting: Family Medicine

## 2019-03-11 VITALS — BP 140/80 | HR 78 | Temp 98.3°F | Ht 62.5 in | Wt 195.4 lb

## 2019-03-11 DIAGNOSIS — M1990 Unspecified osteoarthritis, unspecified site: Secondary | ICD-10-CM

## 2019-03-11 DIAGNOSIS — I10 Essential (primary) hypertension: Secondary | ICD-10-CM | POA: Diagnosis not present

## 2019-03-11 DIAGNOSIS — J449 Chronic obstructive pulmonary disease, unspecified: Secondary | ICD-10-CM | POA: Diagnosis not present

## 2019-03-11 DIAGNOSIS — R7303 Prediabetes: Secondary | ICD-10-CM

## 2019-03-11 DIAGNOSIS — K219 Gastro-esophageal reflux disease without esophagitis: Secondary | ICD-10-CM

## 2019-03-11 DIAGNOSIS — E042 Nontoxic multinodular goiter: Secondary | ICD-10-CM

## 2019-03-11 DIAGNOSIS — D500 Iron deficiency anemia secondary to blood loss (chronic): Secondary | ICD-10-CM | POA: Diagnosis not present

## 2019-03-11 DIAGNOSIS — Z23 Encounter for immunization: Secondary | ICD-10-CM

## 2019-03-11 LAB — COMPREHENSIVE METABOLIC PANEL
ALT: 19 U/L (ref 0–35)
AST: 21 U/L (ref 0–37)
Albumin: 4.2 g/dL (ref 3.5–5.2)
Alkaline Phosphatase: 62 U/L (ref 39–117)
BUN: 15 mg/dL (ref 6–23)
CO2: 27 mEq/L (ref 19–32)
Calcium: 9.3 mg/dL (ref 8.4–10.5)
Chloride: 104 mEq/L (ref 96–112)
Creatinine, Ser: 0.89 mg/dL (ref 0.40–1.20)
GFR: 62.41 mL/min (ref >60.00)
Glucose, Bld: 94 mg/dL (ref 70–99)
Potassium: 4.3 mEq/L (ref 3.5–5.1)
Sodium: 138 mEq/L (ref 135–145)
Total Bilirubin: 0.6 mg/dL (ref 0.2–1.2)
Total Protein: 7.1 g/dL (ref 6.0–8.3)

## 2019-03-11 LAB — CBC
HCT: 36.5 % (ref 36.0–46.0)
Hemoglobin: 12.1 g/dL (ref 12.0–15.0)
MCHC: 33.1 g/dL (ref 30.0–36.0)
MCV: 89.5 fl (ref 78.0–100.0)
Platelets: 217 10*3/uL (ref 150.0–400.0)
RBC: 4.08 Mil/uL (ref 3.87–5.11)
RDW: 14.1 % (ref 11.5–15.5)
WBC: 4.7 10*3/uL (ref 4.0–10.5)

## 2019-03-11 LAB — IBC + FERRITIN
Ferritin: 12.1 ng/mL (ref 10.0–291.0)
Iron: 66 ug/dL (ref 42–145)
Saturation Ratios: 16.7 % — ABNORMAL LOW (ref 20.0–50.0)
Transferrin: 282 mg/dL (ref 212.0–360.0)

## 2019-03-11 LAB — LIPID PANEL
Cholesterol: 176 mg/dL (ref 0–200)
HDL: 53 mg/dL (ref >39.00)
LDL Cholesterol: 104 mg/dL — ABNORMAL HIGH (ref 0–99)
NonHDL: 122.55
Total CHOL/HDL Ratio: 3
Triglycerides: 91 mg/dL (ref 0.0–149.0)
VLDL: 18.2 mg/dL (ref 0.0–40.0)

## 2019-03-11 LAB — HEMOGLOBIN A1C: Hgb A1c MFr Bld: 6.2 % (ref 4.6–6.5)

## 2019-03-11 MED ORDER — ALBUTEROL SULFATE (2.5 MG/3ML) 0.083% IN NEBU: 2.5000 mg | INHALATION_SOLUTION | Freq: Four times a day (QID) | RESPIRATORY_TRACT | 1 refills | Status: DC | PRN

## 2019-03-11 NOTE — Assessment & Plan Note (Signed)
Adequately controlled on medication.  She will continue omeprazole.

## 2019-03-11 NOTE — Assessment & Plan Note (Signed)
Follow-up ultrasound was stable.  He does report some difficulty swallowing.  Prior EGD did not reveal a specific cause for that.  She will be referred to ENT for evaluation.

## 2019-03-11 NOTE — Assessment & Plan Note (Signed)
Patient with likely osteoarthritis though she does have a family history of psoriatic arthritis and does have some nail changes.  She is requesting rheumatology referral and this has been placed.  Discussed that she may need to see dermatology for her nail changes after seeing rheumatology.

## 2019-03-11 NOTE — Assessment & Plan Note (Signed)
History of asthma.  Refill given of albuterol nebulizer treatment.  She rarely requires this.

## 2019-03-11 NOTE — Assessment & Plan Note (Signed)
Check lab work. 

## 2019-03-11 NOTE — Patient Instructions (Signed)
Nice to see you. We will get you see ENT and rheumatology. We will get lab work today.

## 2019-03-11 NOTE — Assessment & Plan Note (Signed)
Check A1c. 

## 2019-03-11 NOTE — Assessment & Plan Note (Signed)
Adequately controlled.  Continue current regimen.  Check lab work. 

## 2019-03-11 NOTE — Progress Notes (Signed)
Tommi Rumps, MD Phone: [phone]  Madison Craig is a 73 y.o. female who presents today for follow-up.  Hypertension: Typically 127/77.  No chest pain.  She does note some chronic dyspnea at times related to asthma.  She has chronic lower extremity edema that only occurs in the evening and resolves with propping her legs up.  No orthopnea or PND.  Asthma: She rarely has symptoms.  She cannot remember the last time she used her albuterol.  No wheezing or cough.  Arthritis: Patient notes her arthritic pain is stable.  Notes typically affecting all of her joints though notes her knees have been most bothersome and she has seen orthopedics and they have advised a knee replacement.  She is currently doing injections for that.  She takes diclofenac with good benefit.  She wonders about seeing a rheumatologist as her daughter has psoriatic arthritis.  Patient has had nail changes over a number of years.  She notes she did injure her right thumbnail recently and it has been separating and the little thickened and has a greenish tint to it.  GERD: Taking omeprazole.  No reflux, abdominal pain, or blood in her stool.  She does feel like things get stuck in her throat when she eats.  That has been going on for many years.  Social History   Tobacco Use  Smoking Status Former Smoker  . Packs/day: 2.00  . Years: 16.00  . Pack years: 32.00  . Types: Cigarettes  . Quit date: 09/29/1978  . Years since quitting: 40.4  Smokeless Tobacco Never Used     ROS see history of present illness  Objective  Physical Exam Vitals:   03/11/19 0813  BP: 140/80  Pulse: 78  Temp: 98.3 F (36.8 C)  SpO2: 96%    BP Readings from Last 3 Encounters:  03/11/19 140/80  03/01/18 140/90  02/02/18 (!) 147/82   Wt Readings from Last 3 Encounters:  03/11/19 195 lb 6.4 oz (88.6 kg)  03/01/18 202 lb 3.2 oz (91.7 kg)  02/02/18 202 lb 8 oz (91.9 kg)    Physical Exam Constitutional:      General:  She is not in acute distress.    Appearance: She is not diaphoretic.  Cardiovascular:     Rate and Rhythm: Normal rate and regular rhythm.     Heart sounds: Normal heart sounds.  Pulmonary:     Effort: Pulmonary effort is normal.     Breath sounds: Normal breath sounds.  Musculoskeletal:     Right lower leg: No edema.     Left lower leg: No edema.  Skin:    General: Skin is warm and dry.     Comments: Bilateral thickened thumbnails with the right thumbnail separating slightly with a greenish discoloration at the tip of the thumbnail  Neurological:     Mental Status: She is alert.      Assessment/Plan: Please see individual problem list.  Essential hypertension Adequately controlled.  Continue current regimen.  Check lab work.  OAD (obstructive airway disease) ([place]) History of asthma.  Refill given of albuterol nebulizer treatment.  She rarely requires this.  GERD (gastroesophageal reflux disease) Adequately controlled on medication.  She will continue omeprazole.  Multinodular goiter Follow-up ultrasound was stable.  He does report some difficulty swallowing.  Prior EGD did not reveal a specific cause for that.  She will be referred to ENT for evaluation.  Osteoarthritis Patient with likely osteoarthritis though she does have a family history of psoriatic  arthritis and does have some nail changes.  She is requesting rheumatology referral and this has been placed.  Discussed that she may need to see dermatology for her nail changes after seeing rheumatology.  Iron deficiency anemia due to chronic blood loss Check lab work.  Prediabetes Check A1c.   Orders Placed This Encounter  Procedures  . Flu Vaccine QUAD High Dose(Fluad)  . CBC  . IBC + Ferritin  . Comp Met (CMET)  . HgB A1c  . Lipid panel  . Ambulatory referral to Rheumatology    Referral Priority:   Routine    Referral Type:   Consultation    Referral Reason:   Specialty Services Required    Requested  Specialty:   Rheumatology    Number of Visits Requested:   1  . Ambulatory referral to ENT    Referral Priority:   Routine    Referral Type:   Consultation    Referral Reason:   Specialty Services Required    Requested Specialty:   Otolaryngology    Number of Visits Requested:   1    Meds ordered this encounter  Medications  . albuterol (PROVENTIL) (2.5 MG/3ML) 0.083% nebulizer solution    Sig: Take 3 mLs (2.5 mg total) by nebulization every 6 (six) hours as needed for wheezing or shortness of breath.    Dispense:  150 mL    Refill:  [place], MD [place]

## 2019-04-01 DIAGNOSIS — E042 Nontoxic multinodular goiter: Secondary | ICD-10-CM | POA: Diagnosis not present

## 2019-04-01 DIAGNOSIS — J301 Allergic rhinitis due to pollen: Secondary | ICD-10-CM | POA: Diagnosis not present

## 2019-04-14 DIAGNOSIS — M2550 Pain in unspecified joint: Secondary | ICD-10-CM | POA: Diagnosis not present

## 2019-04-14 DIAGNOSIS — M8949 Other hypertrophic osteoarthropathy, multiple sites: Secondary | ICD-10-CM | POA: Diagnosis not present

## 2019-04-14 DIAGNOSIS — M170 Bilateral primary osteoarthritis of knee: Secondary | ICD-10-CM | POA: Diagnosis not present

## 2019-04-14 DIAGNOSIS — M159 Polyosteoarthritis, unspecified: Secondary | ICD-10-CM | POA: Insufficient documentation

## 2019-05-31 ENCOUNTER — Other Ambulatory Visit: Payer: Self-pay | Admitting: Family Medicine

## 2019-06-02 NOTE — Progress Notes (Signed)
Lab results mailed to patient with comments from the provider after 3 attempts of reaching the patient.  Kenya Kook,cma

## 2019-07-01 ENCOUNTER — Encounter: Payer: Self-pay | Admitting: Emergency Medicine

## 2019-07-01 ENCOUNTER — Ambulatory Visit
Admission: EM | Admit: 2019-07-01 | Discharge: 2019-07-01 | Disposition: A | Discharge status: Home / Self Care | Payer: PPO | Attending: Emergency Medicine | Admitting: Emergency Medicine

## 2019-07-01 ENCOUNTER — Other Ambulatory Visit: Payer: Self-pay

## 2019-07-01 DIAGNOSIS — Z87891 Personal history of nicotine dependence: Secondary | ICD-10-CM

## 2019-07-01 DIAGNOSIS — J209 Acute bronchitis, unspecified: Secondary | ICD-10-CM | POA: Diagnosis not present

## 2019-07-01 DIAGNOSIS — J441 Chronic obstructive pulmonary disease with (acute) exacerbation: Secondary | ICD-10-CM | POA: Diagnosis not present

## 2019-07-01 MED ORDER — PREDNISONE 10 MG PO TABS: 40.0000 mg | ORAL_TABLET | Freq: Every day | ORAL | 0 refills | Status: AC

## 2019-07-01 MED ORDER — ALBUTEROL SULFATE HFA 108 (90 BASE) MCG/ACT IN AERS: 2.0000 | INHALATION_SPRAY | RESPIRATORY_TRACT | 0 refills | Status: DC | PRN

## 2019-07-01 MED ORDER — AZITHROMYCIN 250 MG PO TABS: 250.0000 mg | ORAL_TABLET | Freq: Every day | ORAL | 0 refills | Status: DC

## 2019-07-01 NOTE — ED Triage Notes (Signed)
Patient in office today c/o cough and runny nose states that mucus has been coming up clear mucus throat hurts from coughing so much. She works in a daycare  [id number]

## 2019-07-01 NOTE — ED Provider Notes (Signed)
Roderic Palau    CSN: [id number] Arrival date & time: 07/01/19  1011      History   Chief Complaint Chief Complaint  Patient presents with  . Cough  . runny nose    HPI Madison Craig is a 74 y.o. female.   Patient presents with nonproductive cough, runny nose, postnasal drip, sinus congestion since yesterday.  She states her symptoms began after she was exposed to a cleaning agent at her work; she works at a daycare.  She denies fever, chills, shortness of breath, vomiting, diarrhea, rash, or other symptoms.  No recent COVID testing.  She has COPD and states she is out of her albuterol inhaler.  The history is provided by the patient.    Past Medical History:  Diagnosis Date  . Anemia   . Arthritis   . Dyspnea 09/28/2013   Pulmonary function testing done on 02/09/2014 showed minimal airway obstruction with a lack of response to bronchodilators; her FEV1 was normal, the FEV1/FVC ratio and the FEF 25-75% were reduced; the airway resistance was normal; lung volumes were within normal limits; the diffusing capacity was high for the measured volumes.    Marland Kitchen GERD (gastroesophageal reflux disease)   . Heart murmur   . Macular degeneration 01/11/2014   Followed at [facility] in [place], [place].   . Multiple thyroid nodules    Thyroid ultrasound on 01/25/2014 showed a multinodular goiter, with a 1.5 cm nodule in the inferior left thyroid lobe that met the criteria for ultrasound-guided biopsy.  On 02/06/2014 an ultrasound guided needle aspirate biopsy was performed of the left inferior thyroid nodule; cytopathology findings were consistent with non-neoplastic goiter.     . Osteoporosis 09/18/2014   DXA 08/16/2014 showed osteopenia of right femur neck (T-score -2.3) and osteoporosis of left forearm radius (T-score -2.9).  Lumbar spine was not utilized due to advanced degenerative changes.   Marland Kitchen SLEEP APNEA 03/22/2009   Had sleep study in [place] about 10 years, has BiPAP,  cannot sleep with it on.      Patient Active Problem List   Diagnosis Date Noted  . Dizziness 02/02/2018  . Palpitations 11/11/2017  . Rosacea 11/11/2017  . Murmur 11/11/2017  . Allergic rhinitis 08/11/2017  . Heme + stool   . Benign neoplasm of ascending colon   . Diverticulosis of large intestine without diverticulitis   . Polyp of sigmoid colon   . Reflux esophagitis   . Stomach irritation   . Hyperlipidemia 02/05/2017  . Prediabetes 02/05/2017  . Essential hypertension 12/03/2015  . Osteoporosis 09/18/2014  . Iron deficiency anemia due to chronic blood loss 01/11/2014  . Macular degeneration 01/11/2014  . OAD (obstructive airway disease) ([place]) 09/28/2013  . GERD (gastroesophageal reflux disease) 09/28/2013  . Hiatal hernia 03/22/2009  . Osteoarthritis 03/22/2009  . Sleep apnea 03/22/2009  . Multinodular goiter 03/22/2009    Past Surgical History:  Procedure Laterality Date  . BREAST SURGERY  1988   breast biopsy  . CARDIAC CATHETERIZATION  2006   No Stents/[facility]  . COLONOSCOPY WITH PROPOFOL N/A 04/24/2017   Procedure: COLONOSCOPY WITH PROPOFOL;  Surgeon: Virgel Manifold, MD;  Location: [facility] ENDOSCOPY;  Service: Endoscopy;  Laterality: N/A;  . ESOPHAGOGASTRODUODENOSCOPY (EGD) WITH PROPOFOL N/A 04/24/2017   Procedure: ESOPHAGOGASTRODUODENOSCOPY (EGD) WITH PROPOFOL;  Surgeon: Virgel Manifold, MD;  Location: [facility] ENDOSCOPY;  Service: Endoscopy;  Laterality: N/A;    OB History   No obstetric history on file.      Home  Medications    Prior to Admission medications   Medication Sig Start Date End Date Taking? Authorizing Provider  albuterol (VENTOLIN HFA) 108 (90 Base) MCG/ACT inhaler Inhale 2 puffs into the lungs every 4 (four) hours as needed for wheezing or shortness of breath. 07/01/19   Sharion Balloon, NP  azithromycin (ZITHROMAX) 250 MG tablet Take 1 tablet (250 mg total) by mouth daily. Take first 2 tablets together, then 1 every day until finished.  07/01/19   Sharion Balloon, NP  diclofenac (VOLTAREN) 75 MG EC tablet Take 1 tablet by mouth twice daily as needed 06/02/19   Leone Haven, MD  doxycycline (PERIOSTAT) 20 MG tablet Take 20 mg by mouth 2 (two) times daily with a meal. 01/21/18   [provider]  ferrous sulfate (FERROUSUL) 325 (65 FE) MG tablet Take 1 tablet (325 mg total) by mouth 2 (two) times daily. 06/17/17 02/02/18  Virgel Manifold, MD  omeprazole (PRILOSEC) 40 MG capsule Take 1 capsule (40 mg total) by mouth daily. 09/01/18   Leone Haven, MD  predniSONE (DELTASONE) 10 MG tablet Take 4 tablets (40 mg total) by mouth daily for 5 days. 07/01/19 07/06/19  Sharion Balloon, NP    Family History Family History  Problem Relation Age of Onset  . Heart disease Father 2  . Sudden death Father   . Hypertension Father   . Hyperlipidemia Father   . Heart attack Father   . Diabetes Son 54  . Stroke Mother   . Hypertension Mother   . Dementia Mother   . Heart attack Mother   . Stroke Sister 86  . Alcohol abuse Sister   . Arrhythmia Brother   . Colon cancer Neg Hx   . Breast cancer Neg Hx     Social History Social History   Tobacco Use  . Smoking status: Former Smoker    Packs/day: 2.00    Years: 16.00    Pack years: 32.00    Types: Cigarettes    Quit date: 09/29/1978    Years since quitting: 40.7  . Smokeless tobacco: Never Used  Substance Use Topics  . Alcohol use: No  . Drug use: No     Allergies   Patient has no known allergies.   Review of Systems Review of Systems  Constitutional: Negative for chills and fever.  HENT: Positive for congestion, postnasal drip and rhinorrhea. Negative for ear pain and sore throat.   Eyes: Negative for pain and visual disturbance.  Respiratory: Positive for cough. Negative for shortness of breath.   Cardiovascular: Negative for chest pain and palpitations.  Gastrointestinal: Negative for abdominal pain, diarrhea, nausea and vomiting.  Genitourinary: Negative  for dysuria and hematuria.  Musculoskeletal: Negative for arthralgias and back pain.  Skin: Negative for color change and rash.  Neurological: Negative for seizures and syncope.  All other systems reviewed and are negative.    Physical Exam Triage Vital Signs ED Triage Vitals  Enc Vitals Group     BP      Pulse      Resp      Temp      Temp src      SpO2      Weight      Height      Head Circumference      Peak Flow      Pain Score      Pain Loc      Pain Edu?  Excl. in [place]?    No data found.  Updated Vital Signs BP 113/69   Pulse 80   Temp 98.9 F (37.2 C) (Oral)   Resp 18   Ht _0  (1.575 m)   Wt 204 lb (92.5 kg)   SpO2 96%   BMI 37.31 kg/m   Visual Acuity Right Eye Distance:   Left Eye Distance:   Bilateral Distance:    Right Eye Near:   Left Eye Near:    Bilateral Near:     Physical Exam Vitals and nursing note reviewed.  Constitutional:      General: She is not in acute distress.    Appearance: She is well-developed. She is not ill-appearing.  HENT:     Head: Normocephalic and atraumatic.     Right Ear: Tympanic membrane normal.     Left Ear: Tympanic membrane normal.     Nose: Rhinorrhea present.     Mouth/Throat:     Mouth: Mucous membranes are moist.     Pharynx: Oropharynx is clear.  Eyes:     Conjunctiva/sclera: Conjunctivae normal.  Cardiovascular:     Rate and Rhythm: Normal rate and regular rhythm.     Heart sounds: No murmur.  Pulmonary:     Effort: Pulmonary effort is normal. No respiratory distress.     Breath sounds: Normal breath sounds. No wheezing or rhonchi.  Abdominal:     General: Bowel sounds are normal.     Palpations: Abdomen is soft.     Tenderness: There is no abdominal tenderness. There is no guarding or rebound.  Musculoskeletal:     Cervical back: Neck supple.  Skin:    General: Skin is warm and dry.     Findings: No rash.  Neurological:     General: No focal deficit present.     Mental Status: She is  alert and oriented to person, place, and time.  Psychiatric:        Mood and Affect: Mood normal.        Behavior: Behavior normal.      UC Treatments / Results  Labs (all labs ordered are listed, but only abnormal results are displayed) Labs Reviewed  NOVEL CORONAVIRUS, NAA    EKG   Radiology No results found.  Procedures Procedures (including critical care time)  Medications Ordered in UC Medications - No data to display  Initial Impression / Assessment and Plan / UC Course  I have reviewed the triage vital signs and the nursing notes.  Pertinent labs & imaging results that were available during my care of the patient were reviewed by me and considered in my medical decision making (see chart for details).    COPD exacerbation, acute bronchitis.  Treating with prednisone, albuterol inhaler, Zithromax.  Instructed patient to follow-up with her PCP in 1 week.  COVID test performed here.  Instructed patient to self quarantine until the test result is back.  Discussed with patient that she can take Tylenol as needed for fever or discomfort.  Instructed patient to go to the emergency department if she develops high fever, shortness of breath, severe diarrhea, or other concerning symptoms.  Patient agrees with plan of care.   Final Clinical Impressions(s) / UC Diagnoses   Final diagnoses:  Acute bronchitis, unspecified organism  COPD exacerbation ([place])     Discharge Instructions     Take the Zithromax, prednisone, albuterol inhaler as directed.  Follow-up with your primary care provider in 1 week for a recheck.  Your COVID test is pending.  You should self quarantine until your test result is back and is negative.    Take Tylenol as needed for fever or discomfort.  Rest and keep yourself hydrated.    Go to the emergency department if you develop high fever, shortness of breath, severe diarrhea, or other concerning symptoms.       ED Prescriptions    Medication Sig  Dispense Auth. Provider   albuterol (VENTOLIN HFA) 108 (90 Base) MCG/ACT inhaler Inhale 2 puffs into the lungs every 4 (four) hours as needed for wheezing or shortness of breath. 18 g Sharion Balloon, NP   azithromycin (ZITHROMAX) 250 MG tablet Take 1 tablet (250 mg total) by mouth daily. Take first 2 tablets together, then 1 every day until finished. 6 tablet Sharion Balloon, NP   predniSONE (DELTASONE) 10 MG tablet Take 4 tablets (40 mg total) by mouth daily for 5 days. 20 tablet Sharion Balloon, NP     PDMP not reviewed this encounter.   Sharion Balloon, NP 07/01/19 1045

## 2019-07-01 NOTE — Discharge Instructions (Signed)
Take the Zithromax, prednisone, albuterol inhaler as directed.  Follow-up with your primary care provider in 1 week for a recheck.  Your COVID test is pending.  You should self quarantine until your test result is back and is negative.    Take Tylenol as needed for fever or discomfort.  Rest and keep yourself hydrated.    Go to the emergency department if you develop high fever, shortness of breath, severe diarrhea, or other concerning symptoms.

## 2019-07-03 LAB — NOVEL CORONAVIRUS, NAA: SARS-CoV-2, NAA: DETECTED — AB

## 2019-07-04 ENCOUNTER — Telehealth: Payer: Self-pay | Admitting: Internal Medicine

## 2019-07-04 NOTE — Telephone Encounter (Signed)
Called to discuss with Marian Sorrow about Covid symptoms and the use of bamlanivimab, a monoclonal antibody infusion for those with mild to moderate Covid symptoms and at a high risk of hospitalization.     Pt is qualified for this infusion at the [facility] infusion center due to co-morbid conditions and/or a member of an at-risk group, however declines infusion at this time. Symptoms tier reviewed as well as criteria for ending isolation.  Symptoms reviewed that would warrant ED/Hospital evaluation. Preventative practices reviewed. Patient verbalized understanding. Patient advised to call back if he decides that he does want to get infusion. Callback number to the infusion center given. Patient advised to go to Urgent care or ED with severe symptoms. Last date she would be eligible for infusion is 2/13.      Patient Active Problem List   Diagnosis Date Noted  . Dizziness 02/02/2018  . Palpitations 11/11/2017  . Rosacea 11/11/2017  . Murmur 11/11/2017  . Allergic rhinitis 08/11/2017  . Heme + stool   . Benign neoplasm of ascending colon   . Diverticulosis of large intestine without diverticulitis   . Polyp of sigmoid colon   . Reflux esophagitis   . Stomach irritation   . Hyperlipidemia 02/05/2017  . Prediabetes 02/05/2017  . Essential hypertension 12/03/2015  . Osteoporosis 09/18/2014  . Iron deficiency anemia due to chronic blood loss 01/11/2014  . Macular degeneration 01/11/2014  . OAD (obstructive airway disease) ([place]) 09/28/2013  . GERD (gastroesophageal reflux disease) 09/28/2013  . Hiatal hernia 03/22/2009  . Osteoarthritis 03/22/2009  . Sleep apnea 03/22/2009  . Multinodular goiter 03/22/2009   Alan Ripper, NP-C [facility] [place]  pgr [phone]

## 2019-07-08 ENCOUNTER — Other Ambulatory Visit: Payer: Self-pay | Admitting: Internal Medicine

## 2019-07-08 ENCOUNTER — Ambulatory Visit (HOSPITAL_COMMUNITY)
Admission: RE | Admit: 2019-07-08 | Discharge: 2019-07-08 | Disposition: A | Discharge status: Home / Self Care | Payer: Medicare Other | Source: Ambulatory Visit | Attending: Pulmonary Disease | Admitting: Pulmonary Disease

## 2019-07-08 DIAGNOSIS — U071 COVID-19: Secondary | ICD-10-CM

## 2019-07-08 DIAGNOSIS — Z23 Encounter for immunization: Secondary | ICD-10-CM | POA: Diagnosis not present

## 2019-07-08 DIAGNOSIS — I10 Essential (primary) hypertension: Secondary | ICD-10-CM

## 2019-07-08 MED ORDER — EPINEPHRINE 0.3 MG/0.3ML IJ SOAJ: 0.3000 mg | Freq: Once | INTRAMUSCULAR | Status: DC | PRN

## 2019-07-08 MED ORDER — DIPHENHYDRAMINE HCL 50 MG/ML IJ SOLN: 50.0000 mg | Freq: Once | INTRAMUSCULAR | Status: DC | PRN

## 2019-07-08 MED ORDER — SODIUM CHLORIDE 0.9 % IV SOLN
INTRAVENOUS | Status: DC | PRN
  Administered 2019-07-08: 250 mL via INTRAVENOUS

## 2019-07-08 MED ORDER — METHYLPREDNISOLONE SODIUM SUCC 125 MG IJ SOLR: 125.0000 mg | Freq: Once | INTRAMUSCULAR | Status: DC | PRN

## 2019-07-08 MED ORDER — SODIUM CHLORIDE 0.9 % IV SOLN
700.0000 mg | Freq: Once | INTRAVENOUS | Status: AC
  Administered 2019-07-08: 700 mg via INTRAVENOUS
  Filled 2019-07-08: qty 20

## 2019-07-08 MED ORDER — ALBUTEROL SULFATE HFA 108 (90 BASE) MCG/ACT IN AERS: 2.0000 | INHALATION_SPRAY | Freq: Once | RESPIRATORY_TRACT | Status: DC | PRN

## 2019-07-08 MED ORDER — FAMOTIDINE IN NACL 20-0.9 MG/50ML-% IV SOLN: 20.0000 mg | Freq: Once | INTRAVENOUS | Status: DC | PRN

## 2019-07-08 NOTE — Progress Notes (Signed)
  Pt's daughter contacted monoclonal antibody infusion clinic stating pt has changed her mind regarding receiving antibody infusion. Pt was initially contacted on 2/8. Unfortunately, she continues to feel unwell with fatigue, headache and body aches. She is a candidate for infusion until 2/13 as symptoms began on 2/4.   This patient is a 74 y.o. female that meets the FDA criteria for Emergency Use Authorization of bamlanivimab or casirivimab\imdevimab.  Has a (+) direct SARS-CoV-2 viral test result  Has mild or moderate COVID-19   Is ? 72 years of age and weighs ? 40 kg  Is NOT hospitalized due to COVID-19  Is NOT requiring oxygen therapy or requiring an increase in baseline oxygen flow rate due to COVID-19  Is within 10 days of symptom onset  Has at least one of the high risk factor(s) for progression to severe COVID-19 and/or hospitalization as defined in EUA.  Specific high risk criteria : >/= 72 yo   I have spoken and communicated the following to the patient or parent/caregiver:  1. FDA has authorized the emergency use of bamlanivimab and casirivimab\imdevimab for the treatment of mild to moderate COVID-19 in adults and pediatric patients with positive results of direct SARS-CoV-2 viral testing who are 7 years of age and older weighing at least 40 kg, and who are at high risk for progressing to severe COVID-19 and/or hospitalization.  2. The significant known and potential risks and benefits of bamlanivimab and casirivimab\imdevimab, and the extent to which such potential risks and benefits are unknown.  3. Information on available alternative treatments and the risks and benefits of those alternatives, including clinical trials.  4. Patients treated with bamlanivimab and casirivimab\imdevimab should continue to self-isolate and use infection control measures (e.g., wear mask, isolate, social distance, avoid sharing personal items, clean and disinfect "high touch" surfaces, and  frequent handwashing) according to CDC guidelines.   5. The patient or parent/caregiver has the option to accept or refuse bamlanivimab or casirivimab\imdevimab .  After reviewing this information with the patient, The patient agreed to proceed with receiving the bamlanimivab infusion and will be provided a copy of the Fact sheet prior to receiving the infusion.   Infusion scheduled for 2/12 at 1430. Daughter aware.   Alan Ripper, NP-C [place]

## 2019-07-08 NOTE — Progress Notes (Signed)
  Diagnosis: COVID-19  Physician:Dr wright  Procedure: Cov[facility] Infusion Clinic Med: bamlanivimab infusion - Provided patient with bamlanimivab fact sheet for patients, parents and caregivers prior to infusion.  Complications: No immediate complications noted.  Discharge: Discharged home   Madison Craig 07/08/2019

## 2019-07-08 NOTE — Discharge Instructions (Signed)

## 2019-08-04 ENCOUNTER — Other Ambulatory Visit: Payer: Self-pay | Admitting: Family Medicine

## 2019-08-04 NOTE — Telephone Encounter (Signed)
Refill request for Voltaren, last seen 03-11-19, last filled 06-02-19.  Please advise.

## 2019-08-30 DIAGNOSIS — H31011 Macula scars of posterior pole (postinflammatory) (post-traumatic), right eye: Secondary | ICD-10-CM | POA: Diagnosis not present

## 2019-08-30 DIAGNOSIS — H541151 Blindness right eye category 5, low vision left eye category 1: Secondary | ICD-10-CM | POA: Diagnosis not present

## 2019-08-30 DIAGNOSIS — H5212 Myopia, left eye: Secondary | ICD-10-CM | POA: Diagnosis not present

## 2019-08-30 DIAGNOSIS — H3530 Unspecified macular degeneration: Secondary | ICD-10-CM | POA: Diagnosis not present

## 2019-09-02 ENCOUNTER — Ambulatory Visit (INDEPENDENT_AMBULATORY_CARE_PROVIDER_SITE_OTHER): Payer: Medicare HMO

## 2019-09-02 VITALS — Ht 62.0 in | Wt 204.0 lb

## 2019-09-02 DIAGNOSIS — Z0000 Encounter for general adult medical examination without abnormal findings: Secondary | ICD-10-CM

## 2019-09-02 NOTE — Progress Notes (Signed)
Subjective:   Madison Craig is a 74 y.o. female who presents for Medicare Annual (Subsequent) preventive examination.  Review of Systems:  No ROS.  Medicare Wellness Virtual Visit.  Visual/audio telehealth visit, UTA vital signs.   Ht/Wt provided. See social history for additional risk factors.   Cardiac Risk Factors include: advanced age (>38mn, >>62women);hypertension     Objective:     Vitals: Ht _0  (1.575 m)   Wt 204 lb (92.5 kg)   BMI 37.31 kg/m   Body mass index is 37.31 kg/m.  Advanced Directives 09/02/2019 08/27/2018 08/24/2017 04/24/2017 08/21/2016 07/13/2015 02/05/2015  Does Patient Have a Medical Advance Directive? _1  No No  Would patient like information on creating a medical advance directive? No - Patient declined Yes (MAU/Ambulatory/Procedural Areas - Information given) Yes (MAU/Ambulatory/Procedural Areas - Information given) - No - Patient declined No - patient declined information Yes - Educational materials given    Tobacco Social History   Tobacco Use  Smoking Status Former Smoker  . Packs/day: 2.00  . Years: 16.00  . Pack years: 32.00  . Types: Cigarettes  . Quit date: 09/29/1978  . Years since quitting: 40.9  Smokeless Tobacco Never Used     Counseling given: Not Answered   Clinical Intake:  Pre-visit preparation completed: Yes        Diabetes: No  How often do you need to have someone help you when you read instructions, pamphlets, or other written materials from your doctor or pharmacy?: 1 - Never  Interpreter Needed?: No     Past Medical History:  Diagnosis Date  . Anemia   . Arthritis   . Dyspnea 09/28/2013   Pulmonary function testing done on 02/09/2014 showed minimal airway obstruction with a lack of response to bronchodilators; her FEV1 was normal, the FEV1/FVC ratio and the FEF 25-75% were reduced; the airway resistance was normal; lung volumes were within normal limits; the diffusing capacity was high for the  measured volumes.    .Marland KitchenGERD (gastroesophageal reflux disease)   . Heart murmur   . Macular degeneration 01/11/2014   [facility] [place]   . Multiple thyroid nodules    Thyroid ultrasound on 01/25/2014 showed a multinodular goiter, with a 1.5 cm nodule in the inferior left thyroid lobe that met the criteria for ultrasound-guided biopsy.  On 02/06/2014 an ultrasound guided needle aspirate biopsy was performed of the left inferior thyroid nodule; cytopathology findings were consistent with non-neoplastic goiter.     . Osteoporosis 09/18/2014   DXA 08/16/2014 showed osteopenia of right femur neck (T-score -2.3) and osteoporosis of left forearm radius (T-score -2.9).  Lumbar spine was not utilized due to advanced degenerative changes.   .Marland KitchenSLEEP APNEA 03/22/2009   [place], has BiPAP, cannot sleep with it on.     Past Surgical History:  Procedure Laterality Date  . BREAST SURGERY  1988   breast biopsy  . CARDIAC CATHETERIZATION  2006   No Stents/[facility]  . COLONOSCOPY WITH PROPOFOL N/A 04/24/2017   Procedure: COLONOSCOPY WITH PROPOFOL;  Surgeon: TVirgel Manifold MD;  Location: [facility] ENDOSCOPY;  Service: Endoscopy;  Laterality: N/A;  . ESOPHAGOGASTRODUODENOSCOPY (EGD) WITH PROPOFOL N/A 04/24/2017   Procedure: ESOPHAGOGASTRODUODENOSCOPY (EGD) WITH PROPOFOL;  Surgeon: TVirgel Manifold MD;  Location: [facility] ENDOSCOPY;  Service: Endoscopy;  Laterality: N/A;   Family History  Problem Relation Age of Onset  . Heart disease Father 422 .  Sudden death Father   . Hypertension Father   . Hyperlipidemia Father   . Heart attack Father   . Diabetes Son 44  . Stroke Mother   . Hypertension Mother   . Dementia Mother   . Heart attack Mother   . Stroke Sister 75  . Alcohol abuse Sister   . Arrhythmia Brother   . Colon cancer Neg Hx   . Breast cancer Neg Hx    Social History   Socioeconomic History  . Marital status: Widowed     Spouse name: Not on file  . Number of children: 3  . Years of education: Not on file  . Highest education level: Not on file  Occupational History  . Not on file  Tobacco Use  . Smoking status: Former Smoker    Packs/day: 2.00    Years: 16.00    Pack years: 32.00    Types: Cigarettes    Quit date: 09/29/1978    Years since quitting: 40.9  . Smokeless tobacco: Never Used  Substance and Sexual Activity  . Alcohol use: No  . Drug use: No  . Sexual activity: Never  Other Topics Concern  . Not on file  Social History Narrative  . Not on file   Social Determinants of Health   Financial Resource Strain:   . Difficulty of Paying Living Expenses:   Food Insecurity:   . Worried About Charity fundraiser in the Last Year:   . Arboriculturist in the Last Year:   Transportation Needs:   . Film/video editor (Medical):   Marland Kitchen Lack of Transportation (Non-Medical):   Physical Activity:   . Days of Exercise per Week:   . Minutes of Exercise per Session:   Stress:   . Feeling of Stress :   Social Connections:   . Frequency of Communication with Friends and Family:   . Frequency of Social Gatherings with Friends and Family:   . Attends Religious Services:   . Active Member of Clubs or Organizations:   . Attends Archivist Meetings:   Marland Kitchen Marital Status:     Outpatient Encounter Medications as of 09/02/2019  Medication Sig  . diclofenac (VOLTAREN) 75 MG EC tablet Take 1 tablet by mouth twice daily as needed  . Multiple Vitamins-Minerals (PRESERVISION AREDS PO) Take 1 capsule by mouth daily.  Marland Kitchen omeprazole (PRILOSEC) 40 MG capsule Take 1 capsule (40 mg total) by mouth daily.  Marland Kitchen albuterol (VENTOLIN HFA) 108 (90 Base) MCG/ACT inhaler Inhale 2 puffs into the lungs every 4 (four) hours as needed for wheezing or shortness of breath. (Patient not taking: Reported on 09/02/2019)  . ferrous sulfate (FERROUSUL) 325 (65 FE) MG tablet Take 1 tablet (325 mg total) by mouth 2 (two) times daily.   . [DISCONTINUED] azithromycin (ZITHROMAX) 250 MG tablet Take 1 tablet (250 mg total) by mouth daily. Take first 2 tablets together, then 1 every day until finished.  . [DISCONTINUED] doxycycline (PERIOSTAT) 20 MG tablet Take 20 mg by mouth 2 (two) times daily with a meal.   No facility-administered encounter medications on file as of 09/02/2019.    Activities of Daily Living In your present state of health, do you have any difficulty performing the following activities: 09/02/2019  Hearing? Y  Comment She does not wear hearing aids, difficulty hearing  Vision? Y  Comment Macular degeneration. Cataract consultation 09/27/18.  Difficulty concentrating or making decisions? N  Walking or climbing stairs? Y  Comment R  knee chronic pain; cortisone injections every 7 months prn.  Dressing or bathing? N  Doing errands, shopping? N  Preparing Food and eating ? N  Using the Toilet? N  In the past six months, have you accidently leaked urine? N  Do you have problems with loss of bowel control? N  Managing your Medications? N  Managing your Finances? N  Housekeeping or managing your Housekeeping? N  Some recent data might be hidden    Patient Care Team: Leone Haven, MD as PCP - General (Family Medicine)    Assessment:   This is a routine wellness examination for Zaineb.  Nurse connected with patient 09/02/19 at 10:30 AM EDT by a telephone enabled telemedicine application and verified that I am speaking with the correct person using two identifiers. Patient stated full name and DOB. Patient gave permission to continue with virtual visit. Patient's location was at home and Nurse's location was at [place] office.   Patient is alert and oriented x3. Patient denies difficulty focusing or concentrating. Patient likes to complete a couple of word puzzles daily for brain health.   Health Maintenance Due: -Mammogram- plans to schedule later in the season.  -Hgb A1c- 03/11/19 (6.2) See  completed HM at the end of note.   Eye: Visual acuity not assessed. Virtual visit. Followed by their ophthalmologist.  Dental: Dentures-top plate  Hearing: Demonstrates normal hearing during visit. Hearing aids- she plans to get   Safety:  Patient feels safe at home- yes Patient does have smoke detectors at home- yes Patient does wear sunscreen or protective clothing when in direct sunlight - yes Patient does wear seat belt when in a moving vehicle - yes Patient drives- yes Adequate lighting in walkways free from debris- yes Grab bars and handrails used as appropriate- yes Ambulates with an assistive device- no Cell phone on person when ambulating outside of the home- yes  Social: Alcohol intake - no Smoking history- former Smokers in home? none Illicit drug use? none  Medication: Taking as directed and without issues.  Self managed - yes   Covid-19: Precautions and sickness symptoms discussed. Wears mask, social distancing, hand hygiene as appropriate.   Activities of Daily Living Patient denies needing assistance with: household chores, feeding themselves, getting from bed to chair, getting to the toilet, bathing/showering, dressing, managing money, or preparing meals.   Discussed the importance of a healthy diet, water intake and the benefits of aerobic exercise.   Physical activity- active around the house, yard work and currently works at the daycare full time.   Diet:  Regular Water: fair; encouraged to increase water intake Caffeine: yes  Other Providers Patient Care Team: Leone Haven, MD as PCP - General (Family Medicine)  Exercise Activities and Dietary recommendations Current Exercise Habits: Home exercise routine, Intensity: Mild  Goals      Patient Stated   . I will get caught up with all appointments (pt-stated)     Mammogram Audiology Dental Covid vaccines Cortisone injection R knee     . Obtain Annual Eye (retinal)  Exam   (pt-stated)       Fall Risk Fall Risk  09/02/2019 03/11/2019 08/27/2018 03/01/2018 08/24/2017  Falls in the past year? 0 0 1 Yes No  Comment - - None since last assessed by pcp for fall about 6 months ago.  - -  Number falls in past yr: - 0 0 1 -  Injury with Fall? - - - No -  Follow up Falls  evaluation completed Falls evaluation completed Falls prevention discussed - -   Timed Get Up and Go performed: no, virtual visit  Depression Screen PHQ 2/9 Scores 09/02/2019 03/11/2019 08/27/2018 03/01/2018  PHQ - 2 Score 0 0 0 0     Cognitive Function MMSE - Mini Mental State Exam 09/02/2019 08/24/2017 08/21/2016  Not completed: Unable to complete - -  Orientation to time - 5 5  Orientation to Place - 5 5  Registration - 3 3  Attention/ Calculation - 5 5  Recall - 2 3  Language- name 2 objects - 2 2  Language- repeat - 1 1  Language- follow 3 step command - 3 3  Language- read & follow direction - 1 1  Write a sentence - 1 1  Copy design - 1 1  Total score - 29 30     6CIT Screen 08/27/2018  What Year? 0 points  What month? 0 points  What time? 0 points  Count back from 20 0 points  Months in reverse 0 points  Repeat phrase 0 points  Total Score 0    Immunization History  Administered Date(s) Administered  . Fluad Quad(high Dose 65+) 03/11/2019  . Influenza,inj,Quad PF,6+ Mos 04/05/2014, 02/05/2015, 01/23/2016  . Pneumococcal Conjugate-13 01/11/2014  . Pneumococcal Polysaccharide-23 12/03/2015  . Tdap 01/11/2014   Screening Tests Health Maintenance  Topic Date Due  . MAMMOGRAM  04/16/2019  . INFLUENZA VACCINE  12/25/2019  . COLONOSCOPY  04/24/2020  . TETANUS/TDAP  01/12/2024  . DEXA SCAN  Completed  . Hepatitis C Screening  Completed  . PNA vac Low Risk Adult  Completed      Plan:    Keep all routine maintenance appointments.   Follow up 10/03/19 @ 8:00  I have personally reviewed and noted the following in the patient's chart:   . Medical and social history . Use of  alcohol, tobacco or illicit drugs  . Current medications and supplements . Functional ability and status . Nutritional status . Physical activity . Advanced directives . List of other physicians . Hospitalizations, surgeries, and ER visits in previous 12 months . Vitals . Screenings to include cognitive, depression, and falls . Referrals and appointments  I have reviewed and discussed with patient certain preventive protocols, quality metrics, and best practice recommendations.     Varney Biles, LPN  07/27/3830

## 2019-09-02 NOTE — Patient Instructions (Addendum)
  Ms. Hayen , Thank you for taking time to come for your Medicare Wellness Visit. I appreciate your ongoing commitment to your health goals. Please review the following plan we discussed and let me know if I can assist you in the future.   These are the goals we discussed: Goals      Patient Stated   . I will get caught up with all appointments (pt-stated)     Mammogram Audiology Dental Covid vaccines Cortisone injection R knee     . Obtain Annual Eye (retinal)  Exam  (pt-stated)       This is a list of the screening recommended for you and due dates:  Health Maintenance  Topic Date Due  . Mammogram  04/16/2019  . Flu Shot  12/25/2019  . Colon Cancer Screening  04/24/2020  . Tetanus Vaccine  01/12/2024  . DEXA scan (bone density measurement)  Completed  .  Hepatitis C: One time screening is recommended by Center for Disease Control  (CDC) for  adults born from [birth year] through 1963.   Completed  . Pneumonia vaccines  Completed

## 2019-09-05 NOTE — Progress Notes (Signed)
I have reviewed the above note and agree.  Keelyn Fjelstad, M.D.  

## 2019-09-09 ENCOUNTER — Ambulatory Visit: Payer: PPO | Admitting: Family Medicine

## 2019-09-20 ENCOUNTER — Other Ambulatory Visit: Payer: Self-pay | Admitting: Family Medicine

## 2019-09-27 DIAGNOSIS — H25013 Cortical age-related cataract, bilateral: Secondary | ICD-10-CM | POA: Diagnosis not present

## 2019-09-27 DIAGNOSIS — H25043 Posterior subcapsular polar age-related cataract, bilateral: Secondary | ICD-10-CM | POA: Diagnosis not present

## 2019-09-27 DIAGNOSIS — H18413 Arcus senilis, bilateral: Secondary | ICD-10-CM | POA: Diagnosis not present

## 2019-09-27 DIAGNOSIS — H2512 Age-related nuclear cataract, left eye: Secondary | ICD-10-CM | POA: Diagnosis not present

## 2019-09-27 DIAGNOSIS — H353114 Nonexudative age-related macular degeneration, right eye, advanced atrophic with subfoveal involvement: Secondary | ICD-10-CM | POA: Diagnosis not present

## 2019-09-27 DIAGNOSIS — H2513 Age-related nuclear cataract, bilateral: Secondary | ICD-10-CM | POA: Diagnosis not present

## 2019-09-27 DIAGNOSIS — H353122 Nonexudative age-related macular degeneration, left eye, intermediate dry stage: Secondary | ICD-10-CM | POA: Diagnosis not present

## 2019-09-29 DIAGNOSIS — H31011 Macula scars of posterior pole (postinflammatory) (post-traumatic), right eye: Secondary | ICD-10-CM | POA: Diagnosis not present

## 2019-09-29 DIAGNOSIS — H353123 Nonexudative age-related macular degeneration, left eye, advanced atrophic without subfoveal involvement: Secondary | ICD-10-CM | POA: Diagnosis not present

## 2019-09-29 DIAGNOSIS — H541151 Blindness right eye category 5, low vision left eye category 1: Secondary | ICD-10-CM | POA: Diagnosis not present

## 2019-09-30 DIAGNOSIS — H2511 Age-related nuclear cataract, right eye: Secondary | ICD-10-CM | POA: Diagnosis not present

## 2019-09-30 DIAGNOSIS — H2512 Age-related nuclear cataract, left eye: Secondary | ICD-10-CM | POA: Diagnosis not present

## 2019-09-30 IMAGING — DX DG ELBOW COMPLETE 3+V*R*
4 series · 4 of 4 positions shown · non-contrast
Comparison: None.

CLINICAL DATA: Fell yesterday with continued pain

EXAM:
RIGHT ELBOW - COMPLETE 3+ VIEW

[elbow ap]
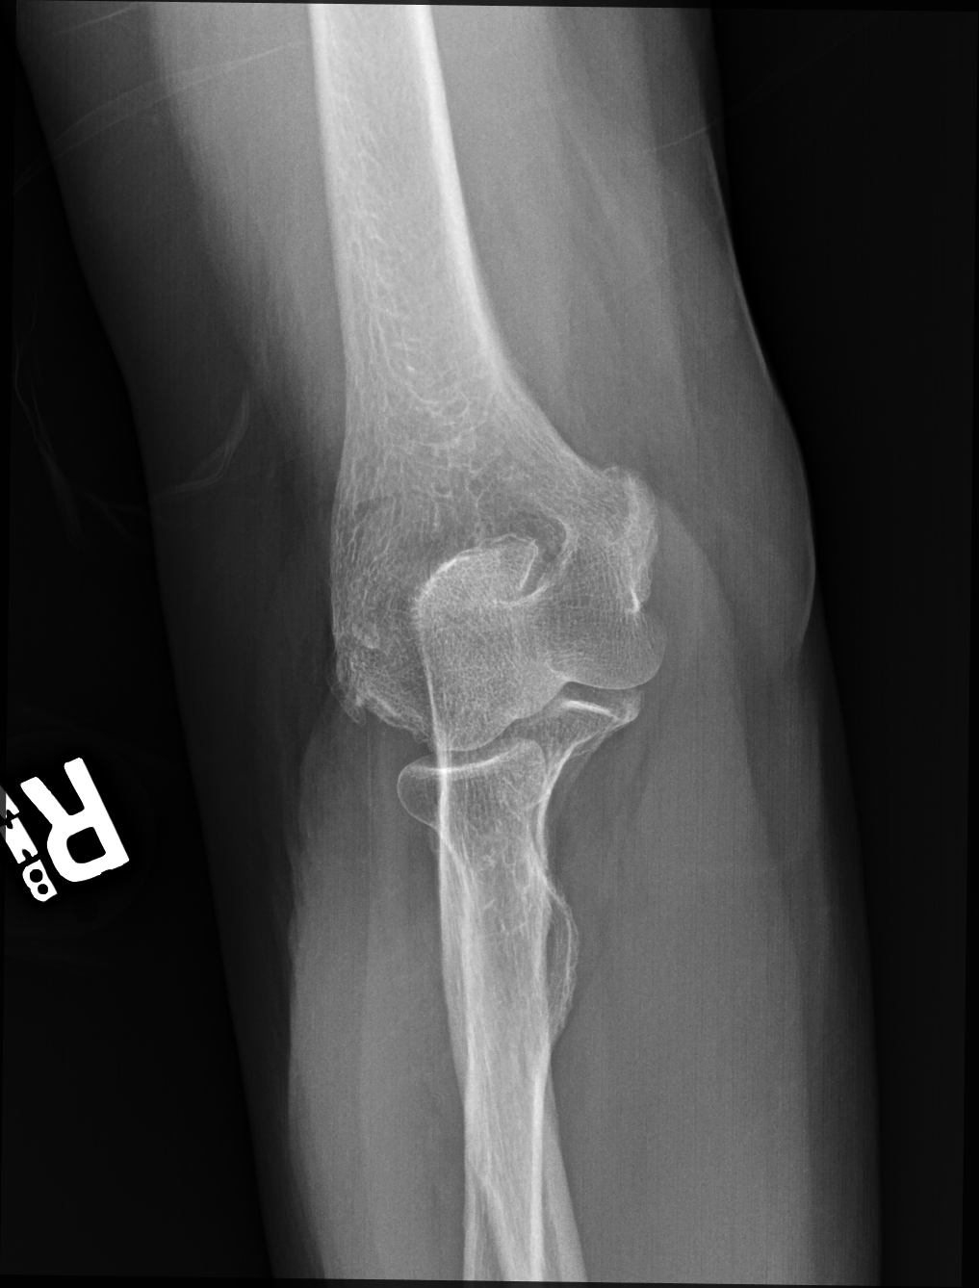

[elbow obl (oblique) (1 of 2)]
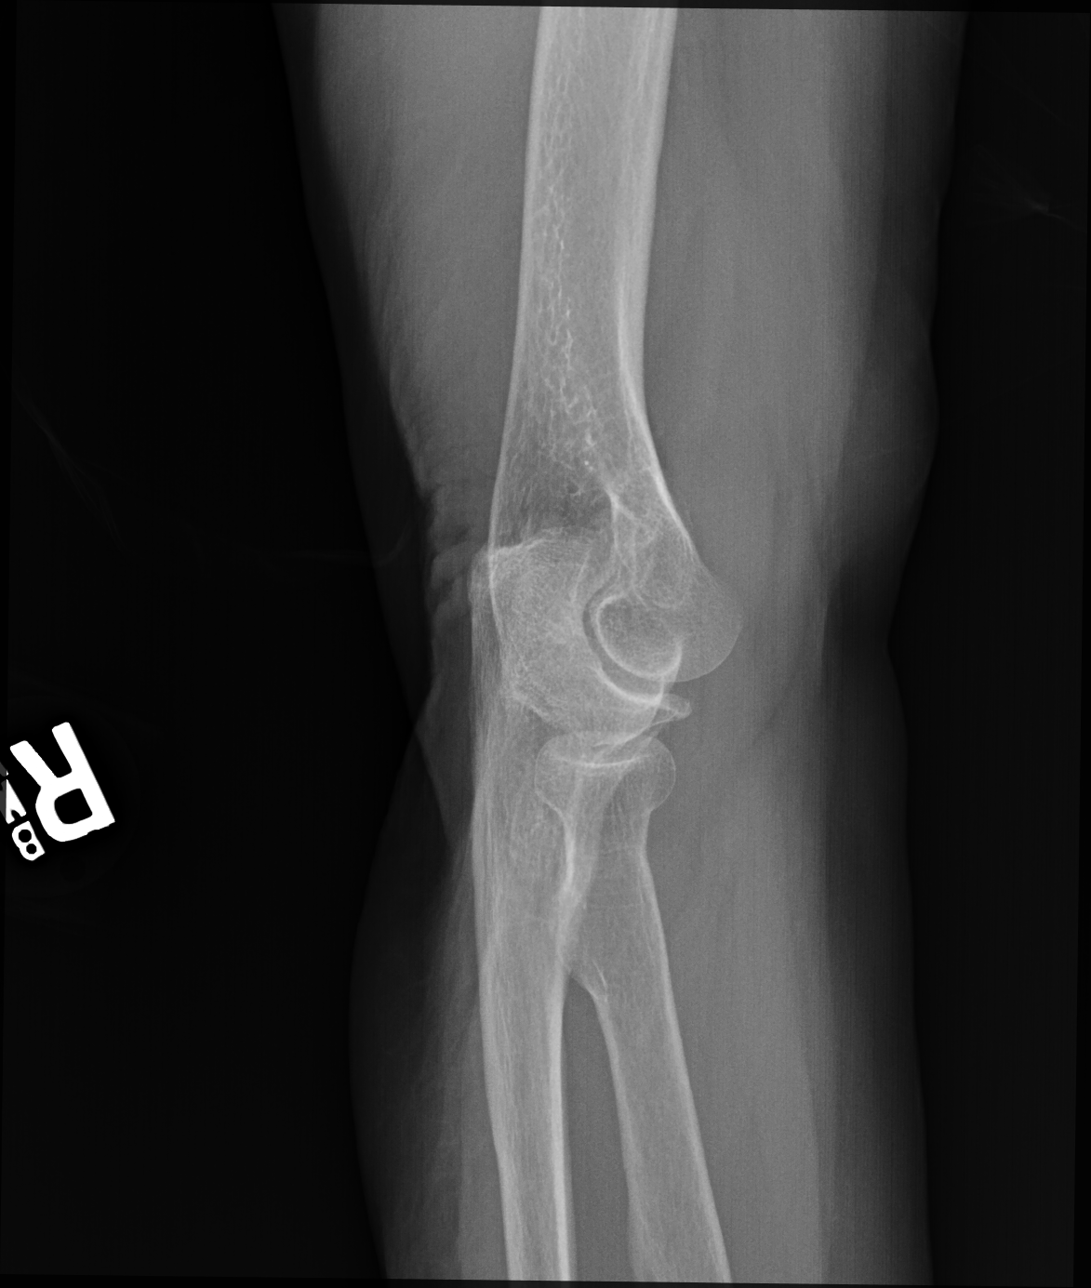

[elbow obl (oblique) (2 of 2)]
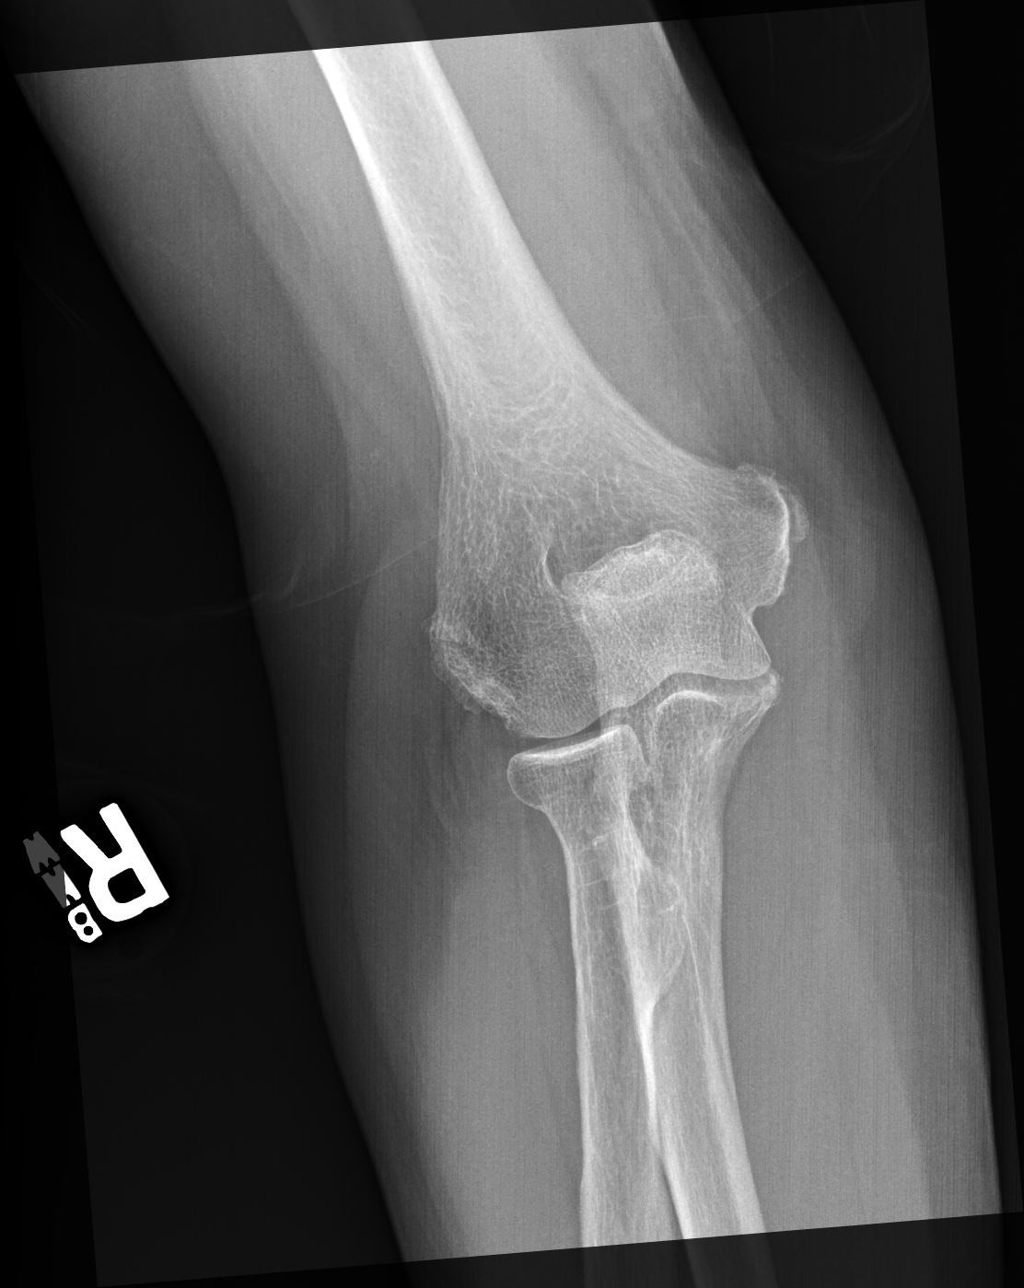

[elbow lat]
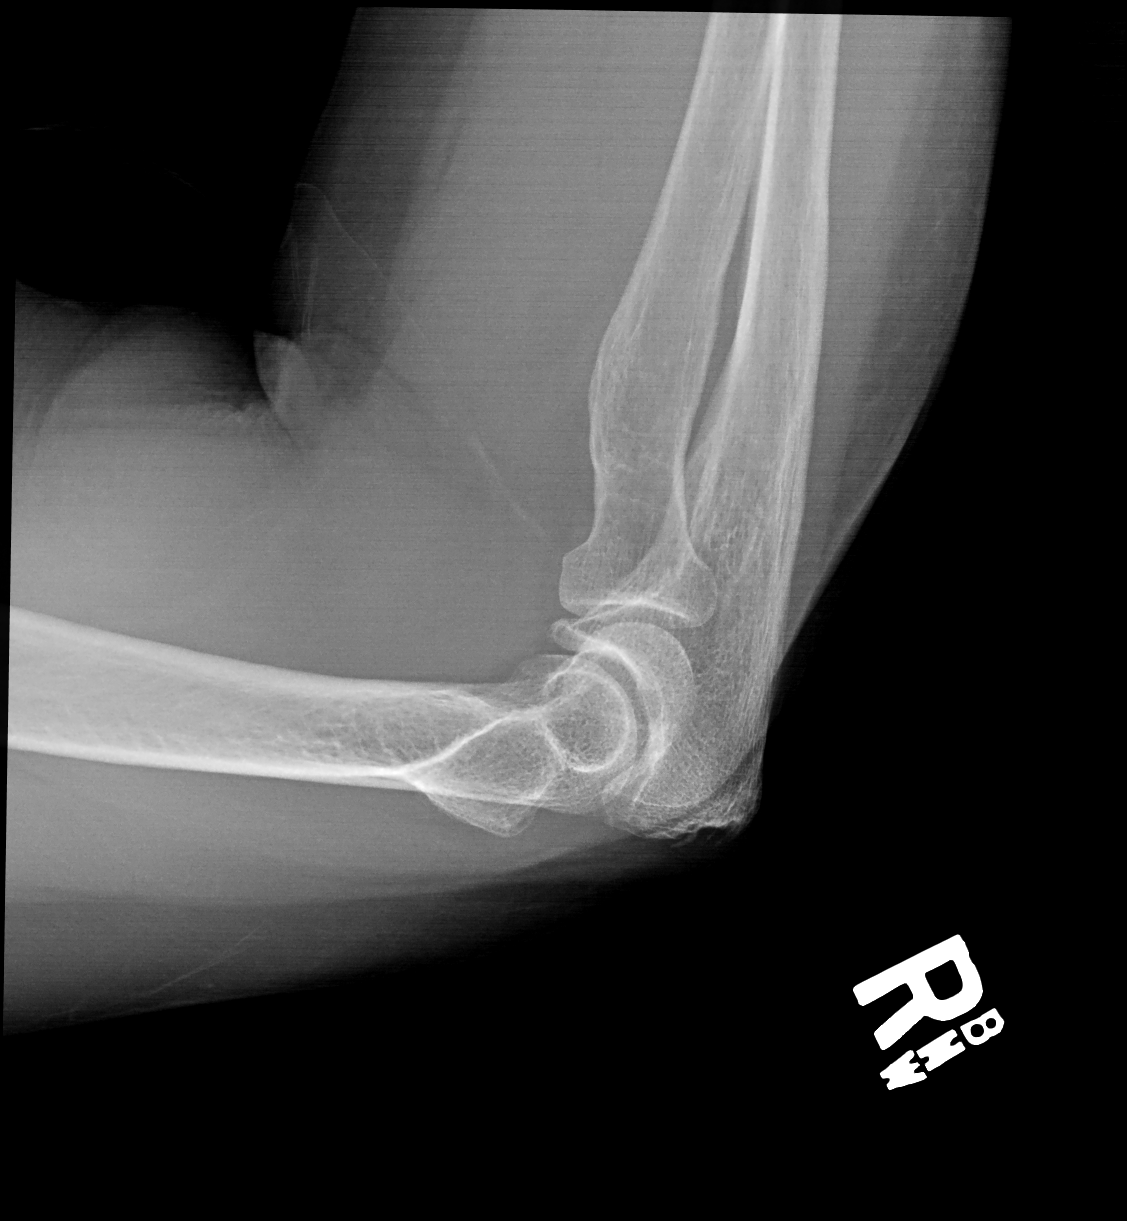

[4 of 4 positions shown; findings below may reference images not displayed]

FINDINGS: No acute fracture is seen. The elbow joint appears normal and no
joint effusion is seen. Alignment is normal. Mild degenerative
changes present.
IMPRESSION: Negative.

## 2019-10-02 ENCOUNTER — Other Ambulatory Visit: Payer: Self-pay | Admitting: Family Medicine

## 2019-10-03 ENCOUNTER — Encounter: Payer: Self-pay | Admitting: Family Medicine

## 2019-10-03 ENCOUNTER — Other Ambulatory Visit: Payer: Self-pay

## 2019-10-03 ENCOUNTER — Other Ambulatory Visit: Payer: Self-pay | Admitting: Family Medicine

## 2019-10-03 ENCOUNTER — Ambulatory Visit (INDEPENDENT_AMBULATORY_CARE_PROVIDER_SITE_OTHER): Payer: Medicare HMO | Admitting: Family Medicine

## 2019-10-03 VITALS — BP 110/70 | HR 85 | Temp 97.6°F | Ht 62.5 in | Wt 196.0 lb

## 2019-10-03 DIAGNOSIS — M1990 Unspecified osteoarthritis, unspecified site: Secondary | ICD-10-CM | POA: Diagnosis not present

## 2019-10-03 DIAGNOSIS — D500 Iron deficiency anemia secondary to blood loss (chronic): Secondary | ICD-10-CM

## 2019-10-03 DIAGNOSIS — K219 Gastro-esophageal reflux disease without esophagitis: Secondary | ICD-10-CM | POA: Diagnosis not present

## 2019-10-03 DIAGNOSIS — I10 Essential (primary) hypertension: Secondary | ICD-10-CM

## 2019-10-03 DIAGNOSIS — G4730 Sleep apnea, unspecified: Secondary | ICD-10-CM

## 2019-10-03 DIAGNOSIS — G933 Postviral fatigue syndrome: Secondary | ICD-10-CM | POA: Diagnosis not present

## 2019-10-03 DIAGNOSIS — D509 Iron deficiency anemia, unspecified: Secondary | ICD-10-CM

## 2019-10-03 DIAGNOSIS — G9331 Postviral fatigue syndrome: Secondary | ICD-10-CM | POA: Insufficient documentation

## 2019-10-03 LAB — COMPREHENSIVE METABOLIC PANEL WITH GFR
ALT: 20 U/L (ref 0–35)
AST: 20 U/L (ref 0–37)
Albumin: 4.1 g/dL (ref 3.5–5.2)
Alkaline Phosphatase: 59 U/L (ref 39–117)
BUN: 17 mg/dL (ref 6–23)
CO2: 29 meq/L (ref 19–32)
Calcium: 9.2 mg/dL (ref 8.4–10.5)
Chloride: 102 meq/L (ref 96–112)
Creatinine, Ser: 0.88 mg/dL (ref 0.40–1.20)
GFR: 63.13 mL/min
Glucose, Bld: 112 mg/dL — ABNORMAL HIGH (ref 70–99)
Potassium: 4.1 meq/L (ref 3.5–5.1)
Sodium: 137 meq/L (ref 135–145)
Total Bilirubin: 0.4 mg/dL (ref 0.2–1.2)
Total Protein: 7.3 g/dL (ref 6.0–8.3)

## 2019-10-03 LAB — CBC WITH DIFFERENTIAL/PLATELET
Basophils Absolute: 0 10*3/uL (ref 0.0–0.1)
Basophils Relative: 0.5 % (ref 0.0–3.0)
Eosinophils Absolute: 0.2 10*3/uL (ref 0.0–0.7)
Eosinophils Relative: 3 % (ref 0.0–5.0)
HCT: 35.5 % — ABNORMAL LOW (ref 36.0–46.0)
Hemoglobin: 11.9 g/dL — ABNORMAL LOW (ref 12.0–15.0)
Lymphocytes Relative: 30.2 % (ref 12.0–46.0)
Lymphs Abs: 1.5 10*3/uL (ref 0.7–4.0)
MCHC: 33.5 g/dL (ref 30.0–36.0)
MCV: 89.5 fl (ref 78.0–100.0)
Monocytes Absolute: 0.4 10*3/uL (ref 0.1–1.0)
Monocytes Relative: 7.9 % (ref 3.0–12.0)
Neutro Abs: 3 10*3/uL (ref 1.4–7.7)
Neutrophils Relative %: 58.4 % (ref 43.0–77.0)
Platelets: 214 10*3/uL (ref 150.0–400.0)
RBC: 3.97 Mil/uL (ref 3.87–5.11)
RDW: 13.9 % (ref 11.5–15.5)
WBC: 5.1 10*3/uL (ref 4.0–10.5)

## 2019-10-03 LAB — IBC + FERRITIN
Ferritin: 16.1 ng/mL (ref 10.0–291.0)
Iron: 51 ug/dL (ref 42–145)
Saturation Ratios: 11.6 % — ABNORMAL LOW (ref 20.0–50.0)
Transferrin: 314 mg/dL (ref 212.0–360.0)

## 2019-10-03 LAB — VITAMIN B12: Vitamin B-12: 325 pg/mL (ref 211–911)

## 2019-10-03 LAB — VITAMIN D 25 HYDROXY (VIT D DEFICIENCY, FRACTURES): VITD: 23.89 ng/mL — ABNORMAL LOW (ref 30.00–100.00)

## 2019-10-03 LAB — TSH: TSH: 2.19 u[IU]/mL (ref 0.35–4.50)

## 2019-10-03 MED ORDER — DICLOFENAC SODIUM 75 MG PO TBEC: 75.0000 mg | DELAYED_RELEASE_TABLET | Freq: Two times a day (BID) | ORAL | 2 refills | Status: DC | PRN

## 2019-10-03 NOTE — Patient Instructions (Signed)
Nice to see you. We will get labs today. I have refilled your diclofenac.

## 2019-10-03 NOTE — Assessment & Plan Note (Signed)
Continue diclofenac.  She will continue omeprazole for GI protection.

## 2019-10-03 NOTE — Assessment & Plan Note (Signed)
Was unable to tolerate CPAP previously.  Discussed possible referral to a sleep specialist once her labs return.

## 2019-10-03 NOTE — Progress Notes (Signed)
Tommi Rumps, MD Phone: [phone]  Madison Craig is a 74 y.o. female who presents today for f/u.  HYPERTENSION  Disease Monitoring  Home BP Monitoring 123/60, 132/90, these were prior to eye surgery, not checking at home currently Chest pain- no    Dyspnea- no Medications  Compliance-  No medications.  Iron deficiency anemia/GERD: Patient has undergone colonoscopy and EGD.  She is not on oral iron at this time.  She does take omeprazole.  As long as she takes the medication she has no reflux symptoms.  No abdominal pain.  No blood in her stool.  No dysphagia.  Osteoarthritis: Continues on diclofenac once daily.  This does not irritate her stomach.  It is helpful.  Fatigue: Notes this started after her Covid diagnosis in February.  She feels quite tired around 1 PM each day.  Has been persistent since February.  Has not been improving.    Social History   Tobacco Use  Smoking Status Former Smoker  . Packs/day: 2.00  . Years: 16.00  . Pack years: 32.00  . Types: Cigarettes  . Quit date: 09/29/1978  . Years since quitting: 41.0  Smokeless Tobacco Never Used     ROS see history of present illness  Objective  Physical Exam Vitals:   10/03/19 0826  BP: 110/70  Pulse: 85  Temp: 97.6 F (36.4 C)  SpO2: 93%    BP Readings from Last 3 Encounters:  10/03/19 110/70  07/08/19 (!) 128/59  07/01/19 113/69   Wt Readings from Last 3 Encounters:  10/03/19 196 lb (88.9 kg)  09/02/19 204 lb (92.5 kg)  07/01/19 204 lb (92.5 kg)    Physical Exam Constitutional:      General: She is not in acute distress.    Appearance: She is not diaphoretic.  Cardiovascular:     Rate and Rhythm: Normal rate and regular rhythm.     Heart sounds: Normal heart sounds.  Pulmonary:     Effort: Pulmonary effort is normal.     Breath sounds: Normal breath sounds.  Musculoskeletal:     Right lower leg: No edema.     Left lower leg: No edema.  Skin:    General: Skin is warm and  dry.  Neurological:     Mental Status: She is alert.      Assessment/Plan: Please see individual problem list.  Essential hypertension Adequate control.  Continue to monitor.  Sleep apnea Was unable to tolerate CPAP previously.  Discussed possible referral to a sleep specialist once her labs return.  GERD (gastroesophageal reflux disease) Well-controlled with omeprazole.  Continue this medication.  Postviral fatigue syndrome I suspect her fatigue is related to her prior COVID-19 infection.  Will obtain lab work to rule out underlying causes.  Discussed potential referral to the post Covid care center in [place] if her labs are unremarkable.  Iron deficiency anemia due to chronic blood loss Check labs.  Osteoarthritis Continue diclofenac.  She will continue omeprazole for GI protection.   Health Maintenance: Patient will call to schedule her mammogram.  Orders Placed This Encounter  Procedures  . Comp Met (CMET)  . CBC w/Diff  . TSH  . B12  . Vitamin D (25 hydroxy)  . IBC + Ferritin    Meds ordered this encounter  Medications  . diclofenac (VOLTAREN) 75 MG EC tablet    Sig: Take 1 tablet (75 mg total) by mouth 2 (two) times daily as needed.    Dispense:  60 tablet  Refill:  2    This visit occurred during the SARS-CoV-2 public health emergency.  Safety protocols were in place, including screening questions prior to the visit, additional usage of staff PPE, and extensive cleaning of exam room while observing appropriate contact time as indicated for disinfecting solutions.    Tommi Rumps, MD [place]

## 2019-10-03 NOTE — Assessment & Plan Note (Signed)
I suspect her fatigue is related to her prior COVID-19 infection.  Will obtain lab work to rule out underlying causes.  Discussed potential referral to the post Covid care center in [place] if her labs are unremarkable.

## 2019-10-03 NOTE — Assessment & Plan Note (Signed)
Adequate control.  Continue to monitor.

## 2019-10-03 NOTE — Assessment & Plan Note (Signed)
Check labs 

## 2019-10-03 NOTE — Assessment & Plan Note (Signed)
Well-controlled with omeprazole.  Continue this medication.

## 2019-10-05 ENCOUNTER — Telehealth: Payer: Self-pay

## 2019-10-05 NOTE — Telephone Encounter (Signed)
LMTCB for lab results.  

## 2019-10-25 DIAGNOSIS — Z0100 Encounter for examination of eyes and vision without abnormal findings: Secondary | ICD-10-CM | POA: Diagnosis not present

## 2019-11-01 ENCOUNTER — Other Ambulatory Visit: Payer: Self-pay | Admitting: Family Medicine

## 2019-11-01 DIAGNOSIS — Z1231 Encounter for screening mammogram for malignant neoplasm of breast: Secondary | ICD-10-CM

## 2019-11-03 ENCOUNTER — Other Ambulatory Visit: Payer: Self-pay

## 2019-11-03 ENCOUNTER — Ambulatory Visit
Admission: RE | Admit: 2019-11-03 | Discharge: 2019-11-03 | Disposition: A | Discharge status: Home / Self Care | Payer: Medicare HMO | Source: Ambulatory Visit | Attending: Family Medicine | Admitting: Family Medicine

## 2019-11-03 ENCOUNTER — Other Ambulatory Visit (INDEPENDENT_AMBULATORY_CARE_PROVIDER_SITE_OTHER): Payer: Medicare HMO

## 2019-11-03 ENCOUNTER — Telehealth: Payer: Self-pay

## 2019-11-03 DIAGNOSIS — D509 Iron deficiency anemia, unspecified: Secondary | ICD-10-CM

## 2019-11-03 DIAGNOSIS — Z1231 Encounter for screening mammogram for malignant neoplasm of breast: Secondary | ICD-10-CM | POA: Insufficient documentation

## 2019-11-03 LAB — CBC
HCT: 36.4 % (ref 36.0–46.0)
Hemoglobin: 12.1 g/dL (ref 12.0–15.0)
MCHC: 33.2 g/dL (ref 30.0–36.0)
MCV: 89.3 fl (ref 78.0–100.0)
Platelets: 207 10*3/uL (ref 150.0–400.0)
RBC: 4.08 Mil/uL (ref 3.87–5.11)
RDW: 14.2 % (ref 11.5–15.5)
WBC: 5.3 10*3/uL (ref 4.0–10.5)

## 2019-11-03 LAB — IBC + FERRITIN
Ferritin: 12.4 ng/mL (ref 10.0–291.0)
Iron: 56 ug/dL (ref 42–145)
Saturation Ratios: 12.8 % — ABNORMAL LOW (ref 20.0–50.0)
Transferrin: 312 mg/dL (ref 212.0–360.0)

## 2019-11-03 NOTE — Telephone Encounter (Signed)
-----   Message from Leone Haven, MD sent at 11/03/2019 12:43 PM EDT ----- Please let the patient know that she remains iron deficient.is she still taking an iron supplement? Would she be willing to see GI to have a follow-up evaluation to look for a source of blood loss?

## 2019-11-07 ENCOUNTER — Telehealth: Payer: Self-pay

## 2019-12-20 ENCOUNTER — Other Ambulatory Visit: Payer: Self-pay | Admitting: Family Medicine

## 2020-02-06 DIAGNOSIS — Z20822 Contact with and (suspected) exposure to covid-19: Secondary | ICD-10-CM | POA: Diagnosis not present

## 2020-03-16 ENCOUNTER — Other Ambulatory Visit: Payer: Self-pay | Admitting: Family Medicine

## 2020-04-04 ENCOUNTER — Other Ambulatory Visit: Payer: Self-pay

## 2020-04-06 ENCOUNTER — Ambulatory Visit (INDEPENDENT_AMBULATORY_CARE_PROVIDER_SITE_OTHER): Payer: Medicare HMO | Admitting: Family Medicine

## 2020-04-06 ENCOUNTER — Other Ambulatory Visit: Payer: Self-pay

## 2020-04-06 ENCOUNTER — Encounter: Payer: Self-pay | Admitting: Family Medicine

## 2020-04-06 VITALS — BP 120/80 | HR 73 | Temp 98.3°F | Ht 62.5 in | Wt 189.0 lb

## 2020-04-06 DIAGNOSIS — R7303 Prediabetes: Secondary | ICD-10-CM | POA: Diagnosis not present

## 2020-04-06 DIAGNOSIS — I10 Essential (primary) hypertension: Secondary | ICD-10-CM | POA: Diagnosis not present

## 2020-04-06 DIAGNOSIS — Z23 Encounter for immunization: Secondary | ICD-10-CM | POA: Diagnosis not present

## 2020-04-06 DIAGNOSIS — E7800 Pure hypercholesterolemia, unspecified: Secondary | ICD-10-CM | POA: Diagnosis not present

## 2020-04-06 DIAGNOSIS — D500 Iron deficiency anemia secondary to blood loss (chronic): Secondary | ICD-10-CM | POA: Diagnosis not present

## 2020-04-06 DIAGNOSIS — K219 Gastro-esophageal reflux disease without esophagitis: Secondary | ICD-10-CM

## 2020-04-06 DIAGNOSIS — G4730 Sleep apnea, unspecified: Secondary | ICD-10-CM

## 2020-04-06 DIAGNOSIS — R011 Cardiac murmur, unspecified: Secondary | ICD-10-CM

## 2020-04-06 LAB — COMPREHENSIVE METABOLIC PANEL
ALT: 16 U/L (ref 0–35)
AST: 17 U/L (ref 0–37)
Albumin: 4.1 g/dL (ref 3.5–5.2)
Alkaline Phosphatase: 60 U/L (ref 39–117)
BUN: 16 mg/dL (ref 6–23)
CO2: 30 mEq/L (ref 19–32)
Calcium: 9.2 mg/dL (ref 8.4–10.5)
Chloride: 102 mEq/L (ref 96–112)
Creatinine, Ser: 0.88 mg/dL (ref 0.40–1.20)
GFR: 65.52 mL/min (ref >60.00)
Glucose, Bld: 78 mg/dL (ref 70–99)
Potassium: 4.3 mEq/L (ref 3.5–5.1)
Sodium: 137 mEq/L (ref 135–145)
Total Bilirubin: 0.6 mg/dL (ref 0.2–1.2)
Total Protein: 7 g/dL (ref 6.0–8.3)

## 2020-04-06 LAB — IBC + FERRITIN
Ferritin: 19.5 ng/mL (ref 10.0–291.0)
Iron: 70 ug/dL (ref 42–145)
Saturation Ratios: 18 % — ABNORMAL LOW (ref 20.0–50.0)
Transferrin: 278 mg/dL (ref 212.0–360.0)

## 2020-04-06 LAB — CBC
HCT: 35.3 % — ABNORMAL LOW (ref 36.0–46.0)
Hemoglobin: 11.8 g/dL — ABNORMAL LOW (ref 12.0–15.0)
MCHC: 33.5 g/dL (ref 30.0–36.0)
MCV: 89.6 fl (ref 78.0–100.0)
Platelets: 197 10*3/uL (ref 150.0–400.0)
RBC: 3.94 Mil/uL (ref 3.87–5.11)
RDW: 13.6 % (ref 11.5–15.5)
WBC: 5.3 10*3/uL (ref 4.0–10.5)

## 2020-04-06 LAB — LIPID PANEL
Cholesterol: 150 mg/dL (ref 0–200)
HDL: 45.2 mg/dL (ref >39.00)
LDL Cholesterol: 77 mg/dL (ref 0–99)
NonHDL: 105.12
Total CHOL/HDL Ratio: 3
Triglycerides: 142 mg/dL (ref 0.0–149.0)
VLDL: 28.4 mg/dL (ref 0.0–40.0)

## 2020-04-06 LAB — HEMOGLOBIN A1C: Hgb A1c MFr Bld: 6 % (ref 4.6–6.5)

## 2020-04-06 NOTE — Patient Instructions (Signed)
Nice to see you. We will get labs today and contact you with the results.  

## 2020-04-06 NOTE — Assessment & Plan Note (Signed)
Offered referral to a sleep specialist though she declines.  She will let us know if she changes her mind.

## 2020-04-06 NOTE — Progress Notes (Signed)
Tommi Rumps, MD Phone: [phone]  Madison Craig is a 74 y.o. female who presents today for f/u.  HYPERTENSION  Disease Monitoring  Home BP Monitoring not checking Chest pain- no    Dyspnea- no Medications  Compliance-  No meds.  Edema- no  GERD:   Reflux symptoms: no   Abd pain: no   Blood in stool: no  Dysphagia: no   EGD: with gastritis, patient has declined capsule endoscopy due to insurance not covering it - she had IDA and +FOBT previously  Medication: omeprazole  OSA: Patient has a BiPAP though does not use it as she was unable to sleep.  She has declined evaluation with a sleep specialist.  Heart murmur: Previously evaluated by cardiology.  Patient has not developed any symptoms.  Cardiology recommended if it got significantly louder over the next several years we could get a baseline echo.   Social History   Tobacco Use  Smoking Status Former Smoker  . Packs/day: 2.00  . Years: 16.00  . Pack years: 32.00  . Types: Cigarettes  . Quit date: 09/29/1978  . Years since quitting: 41.5  Smokeless Tobacco Never Used     ROS see history of present illness  Objective  Physical Exam Vitals:   04/06/20 0829  BP: 120/80  Pulse: 73  Temp: 98.3 F (36.8 C)  SpO2: 98%    BP Readings from Last 3 Encounters:  04/06/20 120/80  10/03/19 110/70  07/08/19 (!) 128/59   Wt Readings from Last 3 Encounters:  04/06/20 189 lb (85.7 kg)  10/03/19 196 lb (88.9 kg)  09/02/19 204 lb (92.5 kg)    Physical Exam Constitutional:      General: She is not in acute distress.    Appearance: She is not diaphoretic.  Cardiovascular:     Rate and Rhythm: Normal rate and regular rhythm.     Heart sounds: Murmur (1/6 systolic ejection murmur right upper sternal border) heard.   Pulmonary:     Effort: Pulmonary effort is normal.     Breath sounds: Normal breath sounds.  Skin:    General: Skin is warm and dry.  Neurological:     Mental Status: She is alert.       Assessment/Plan: Please see individual problem list.  Problem List Items Addressed This Visit    Essential hypertension    Adequate control on no medications.  She will monitor at home and if it trends up she will let us know.      Relevant Orders   Comp Met (CMET)   GERD (gastroesophageal reflux disease)    Patient will continue omeprazole 40 mg daily.  Discussed she will remain on this as long as she has to take diclofenac.      Hyperlipidemia    Check lipid panel.      Relevant Orders   Lipid panel   Iron deficiency anemia due to chronic blood loss - Primary    Check labs.  Consider stopping iron if levels are acceptable.      Relevant Orders   CBC   IBC + Ferritin   Murmur    Stable.  Very faint murmur heard today.  We will continue to monitor and if it increases in intensity consider an echo.      Prediabetes    Check A1c.      Relevant Orders   HgB A1c   Sleep apnea    Offered referral to a sleep specialist though she declines.  She  will let us know if she changes her mind.       Other Visit Diagnoses    Need for immunization against influenza       Relevant Orders   Flu Vaccine QUAD 36+ mos IM (Completed)      This visit occurred during the SARS-CoV-2 public health emergency.  Safety protocols were in place, including screening questions prior to the visit, additional usage of staff PPE, and extensive cleaning of exam room while observing appropriate contact time as indicated for disinfecting solutions.    Tommi Rumps, MD [place]

## 2020-04-06 NOTE — Assessment & Plan Note (Signed)
Stable.  Very faint murmur heard today.  We will continue to monitor and if it increases in intensity consider an echo.

## 2020-04-06 NOTE — Assessment & Plan Note (Signed)
Adequate control on no medications.  She will monitor at home and if it trends up she will let us know.

## 2020-04-06 NOTE — Assessment & Plan Note (Signed)
Check lipid panel  

## 2020-04-06 NOTE — Assessment & Plan Note (Addendum)
Check labs.  Consider stopping iron if levels are acceptable.

## 2020-04-06 NOTE — Assessment & Plan Note (Signed)
Patient will continue omeprazole 40 mg daily.  Discussed she will remain on this as long as she has to take diclofenac.

## 2020-04-06 NOTE — Assessment & Plan Note (Signed)
Check A1c. 

## 2020-06-01 ENCOUNTER — Ambulatory Visit
Admission: EM | Admit: 2020-06-01 | Discharge: 2020-06-01 | Disposition: A | Discharge status: Home / Self Care | Payer: Medicare HMO | Attending: Family Medicine | Admitting: Family Medicine

## 2020-06-01 ENCOUNTER — Other Ambulatory Visit: Payer: Self-pay | Admitting: Family Medicine

## 2020-06-01 ENCOUNTER — Other Ambulatory Visit: Payer: Self-pay

## 2020-06-01 ENCOUNTER — Encounter: Payer: Self-pay | Admitting: Family Medicine

## 2020-06-01 DIAGNOSIS — Z20822 Contact with and (suspected) exposure to covid-19: Secondary | ICD-10-CM | POA: Diagnosis not present

## 2020-06-01 NOTE — Discharge Instructions (Signed)

## 2020-06-01 NOTE — ED Triage Notes (Signed)
Patient presents to Urgent Care with complaints of runny nose x 1 week.  Treating with zyrtec with relief. States only needs a covid test to return to work does not need to see provider.

## 2020-06-04 LAB — NOVEL CORONAVIRUS, NAA: SARS-CoV-2, NAA: NOT DETECTED

## 2020-06-04 LAB — SARS-COV-2, NAA 2 DAY TAT

## 2020-06-11 NOTE — Telephone Encounter (Signed)
Done. ng

## 2020-06-24 ENCOUNTER — Other Ambulatory Visit: Payer: Self-pay

## 2020-06-24 ENCOUNTER — Ambulatory Visit
Admission: EM | Admit: 2020-06-24 | Discharge: 2020-06-24 | Disposition: A | Discharge status: Home / Self Care | Payer: Medicare HMO | Attending: Family Medicine | Admitting: Family Medicine

## 2020-06-24 DIAGNOSIS — Z87891 Personal history of nicotine dependence: Secondary | ICD-10-CM | POA: Diagnosis not present

## 2020-06-24 DIAGNOSIS — J069 Acute upper respiratory infection, unspecified: Secondary | ICD-10-CM | POA: Insufficient documentation

## 2020-06-24 DIAGNOSIS — Z20822 Contact with and (suspected) exposure to covid-19: Secondary | ICD-10-CM | POA: Insufficient documentation

## 2020-06-24 MED ORDER — PROMETHAZINE-DM 6.25-15 MG/5ML PO SYRP: 5.0000 mL | ORAL_SOLUTION | Freq: Four times a day (QID) | ORAL | 0 refills | Status: DC | PRN

## 2020-06-24 MED ORDER — BENZONATATE 100 MG PO CAPS: 200.0000 mg | ORAL_CAPSULE | Freq: Three times a day (TID) | ORAL | 0 refills | Status: DC

## 2020-06-24 NOTE — ED Triage Notes (Addendum)
Pt presents with c/o sinus congestion, chest congestion and cough since Friday night. She has also had some chills this morning. 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 Pt denies n/v/d or other symptoms. Pt has been taking cetirizine for her symptoms.

## 2020-06-24 NOTE — Discharge Instructions (Addendum)
Isolate at home until the results of your Covid test are back.  If you test positive you will have to quarantine for 5 days from when your symptoms started.  After 5 days you can break quarantine if your symptoms have improved and you have not had a fever in 24 hours.  Use over-the-counter Tylenol and ibuprofen as needed for fever and pain.  Use the Tessalon Perles during the day for cough and the Promethazine DM at bedtime for cough and congestion.  If you develop shortness of breath-especially at rest, you are unable to speak in full sentences, or is a late sign your lips start turning blue you need to go to the ER for evaluation.

## 2020-06-24 NOTE — ED Provider Notes (Signed)
[facility]    CSN: [id number] Arrival date & time: 06/24/20  1150      History   Chief Complaint Chief Complaint  Patient presents with  . Nasal Congestion  . Cough    HPI Madison Craig is a 75 y.o. female.   HPI   75 year old female here for evaluation of sinus pressure chest congestion and cough that began 2 days ago.  Patient reports that she developed chills this morning.  She has not been febrile until this point.  Patient is one 101.2 in triage.  Patient is also complaining of a headache, runny nose, and states her cough is productive for yellow sputum.  Patient denies sore throat, ear pain or pressure, shortness of breath or wheezing, or GI complaints.  Patient has been vaccinating his COVID but has not received her booster shot.  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  Patient is requesting prednisone and Z-Pak.  Patient also requesting monoclonal antibodies if she is Covid positive as she had an infusion back in February when she tested positive for COVID and did very well.  Past Medical History:  Diagnosis Date  . Anemia   . Arthritis   . Dyspnea 09/28/2013   Pulmonary function testing done on 02/09/2014 showed minimal airway obstruction with a lack of response to bronchodilators; her FEV1 was normal, the FEV1/FVC ratio and the FEF 25-75% were reduced; the airway resistance was normal; lung volumes were within normal limits; the diffusing capacity was high for the measured volumes.    Marland Kitchen GERD (gastroesophageal reflux disease)   . Heart murmur   . Heme + stool   . Macular degeneration 01/11/2014   Followed at [facility] in [place], [place].   . Multiple thyroid nodules    Thyroid ultrasound on 01/25/2014 showed a multinodular goiter, with a 1.5 cm nodule in the inferior left thyroid lobe that met the criteria for ultrasound-guided biopsy.  On 02/06/2014 an  ultrasound guided needle aspirate biopsy was performed of the left inferior thyroid nodule; cytopathology findings were consistent with non-neoplastic goiter.     . Osteoporosis 09/18/2014   DXA 08/16/2014 showed osteopenia of right femur neck (T-score -2.3) and osteoporosis of left forearm radius (T-score -2.9).  Lumbar spine was not utilized due to advanced degenerative changes.   Marland Kitchen SLEEP APNEA 03/22/2009   Had sleep study in [place] about 10 years, has BiPAP, cannot sleep with it on.      Patient Active Problem List   Diagnosis Date Noted  . Postviral fatigue syndrome 10/03/2019  . Dizziness 02/02/2018  . Palpitations 11/11/2017  . Rosacea 11/11/2017  . Murmur 11/11/2017  . Allergic rhinitis 08/11/2017  . Benign neoplasm of ascending colon   . Diverticulosis of large intestine without diverticulitis   . Polyp of sigmoid colon   . Reflux esophagitis   . Stomach irritation   . Hyperlipidemia 02/05/2017  . Prediabetes 02/05/2017  . Essential hypertension 12/03/2015  . Osteoporosis 09/18/2014  . Iron deficiency anemia due to chronic blood loss 01/11/2014  . Macular degeneration 01/11/2014  . OAD (obstructive airway disease) ([place]) 09/28/2013  . GERD (gastroesophageal reflux disease) 09/28/2013  . Hiatal hernia 03/22/2009  . Osteoarthritis 03/22/2009  . Sleep apnea 03/22/2009  . Multinodular goiter 03/22/2009    Past Surgical History:  Procedure Laterality Date  . BREAST SURGERY  1988   breast biopsy  .  CARDIAC CATHETERIZATION  2006   No Stents/[facility]  . COLONOSCOPY WITH PROPOFOL N/A 04/24/2017   Procedure: COLONOSCOPY WITH PROPOFOL;  Surgeon: Virgel Manifold, MD;  Location: [facility] ENDOSCOPY;  Service: Endoscopy;  Laterality: N/A;  . ESOPHAGOGASTRODUODENOSCOPY (EGD) WITH PROPOFOL N/A 04/24/2017   Procedure: ESOPHAGOGASTRODUODENOSCOPY (EGD) WITH PROPOFOL;  Surgeon: Virgel Manifold, MD;  Location: [facility] ENDOSCOPY;  Service: Endoscopy;  Laterality: N/A;    OB History    No obstetric history on file.      Home Medications    Prior to Admission medications   Medication Sig Start Date End Date Taking? Authorizing Provider  albuterol (VENTOLIN HFA) 108 (90 Base) MCG/ACT inhaler Inhale 2 puffs into the lungs every 4 (four) hours as needed for wheezing or shortness of breath. 07/01/19  Yes Sharion Balloon, NP  benzonatate (TESSALON) 100 MG capsule Take 2 capsules (200 mg total) by mouth every 8 (eight) hours. 06/24/20  Yes Margarette Canada, NP  diclofenac (VOLTAREN) 75 MG EC tablet Take 1 tablet by mouth twice daily as needed 06/01/20  Yes Leone Haven, MD  ferrous sulfate (FERROUSUL) 325 (65 FE) MG tablet Take 1 tablet (325 mg total) by mouth 2 (two) times daily. 06/17/17 02/02/18 Yes Virgel Manifold, MD  Multiple Vitamins-Minerals (PRESERVISION AREDS PO) Take 1 capsule by mouth daily.   Yes [provider]  omeprazole (PRILOSEC) 40 MG capsule Take 1 capsule by mouth once daily 06/01/20  Yes Leone Haven, MD  promethazine-dextromethorphan (PROMETHAZINE-DM) 6.25-15 MG/5ML syrup Take 5 mLs by mouth 4 (four) times daily as needed. 06/24/20  Yes Margarette Canada, NP    Family History Family History  Problem Relation Age of Onset  . Heart disease Father 74  . Sudden death Father   . Hypertension Father   . Hyperlipidemia Father   . Heart attack Father   . Diabetes Son 74  . Stroke Mother   . Hypertension Mother   . Dementia Mother   . Heart attack Mother   . Stroke Sister 79  . Alcohol abuse Sister   . Arrhythmia Brother   . Colon cancer Neg Hx   . Breast cancer Neg Hx     Social History Social History   Tobacco Use  . Smoking status: Former Smoker    Packs/day: 2.00    Years: 16.00    Pack years: 32.00    Types: Cigarettes    Quit date: 09/29/1978    Years since quitting: 41.7  . Smokeless tobacco: Never Used  Vaping Use  . Vaping Use: Never used  Substance Use Topics  . Alcohol use: No  . Drug use: No     Allergies   Patient  has no known allergies.   Review of Systems Review of Systems  Constitutional: Positive for chills and fever. Negative for activity change and appetite change.  HENT: Positive for rhinorrhea and sinus pressure. Negative for ear discharge and sore throat.   Respiratory: Positive for cough. Negative for shortness of breath and wheezing.   Gastrointestinal: Negative for diarrhea, nausea and vomiting.  Skin: Negative for rash.  Neurological: Positive for headaches.  Hematological: Negative.   Psychiatric/Behavioral: Negative.      Physical Exam Triage Vital Signs ED Triage Vitals  Enc Vitals Group     BP 06/24/20 1201 (!) 136/58     Pulse Rate 06/24/20 1201 92     Resp 06/24/20 1201 18     Temp 06/24/20 1201 (!) 101.2 F (38.4 C)  Temp Source 06/24/20 1201 Oral     SpO2 06/24/20 1201 98 %     Weight 06/24/20 1159 189 lb (85.7 kg)     Height 06/24/20 1159 5' 2.5" (1.588 m)     Head Circumference --      Peak Flow --      Pain Score 06/24/20 1159 0     Pain Loc --      Pain Edu? --      Excl. in [place]? --    No data found.  Updated Vital Signs BP (!) 136/58 (BP Location: Left Arm)   Pulse 92   Temp (!) 101.2 F (38.4 C) (Oral)   Resp 18   Ht 5' 2.5" (1.588 m)   Wt 189 lb (85.7 kg)   SpO2 98%   BMI 34.02 kg/m   Visual Acuity Right Eye Distance:   Left Eye Distance:   Bilateral Distance:    Right Eye Near:   Left Eye Near:    Bilateral Near:     Physical Exam Vitals and nursing note reviewed.  Constitutional:      General: She is not in acute distress.    Appearance: Normal appearance. She is not ill-appearing.  HENT:     Head: Normocephalic and atraumatic.     Right Ear: Tympanic membrane, ear canal and external ear normal.     Left Ear: Tympanic membrane, ear canal and external ear normal.     Nose: Congestion and rhinorrhea present.     Comments: Nasal mucosa is erythematous and edematous with clear nasal discharge.    Mouth/Throat:     Mouth: Mucous  membranes are moist.     Pharynx: Oropharynx is clear. No oropharyngeal exudate.  Cardiovascular:     Rate and Rhythm: Normal rate and regular rhythm.     Pulses: Normal pulses.     Heart sounds: Normal heart sounds. No murmur heard. No gallop.   Pulmonary:     Effort: Pulmonary effort is normal.     Breath sounds: Normal breath sounds. No wheezing, rhonchi or rales.  Musculoskeletal:     Cervical back: Normal range of motion and neck supple.  Lymphadenopathy:     Cervical: No cervical adenopathy.  Skin:    General: Skin is warm and dry.     Capillary Refill: Capillary refill takes less than 2 seconds.     Findings: No erythema or rash.  Neurological:     General: No focal deficit present.     Mental Status: She is alert and oriented to person, place, and time.  Psychiatric:        Mood and Affect: Mood normal.        Behavior: Behavior normal.        Thought Content: Thought content normal.        Judgment: Judgment normal.      UC Treatments / Results  Labs (all labs ordered are listed, but only abnormal results are displayed) Labs Reviewed  SARS CORONAVIRUS 2 (TAT 6-24 HRS)    EKG   Radiology No results found.  Procedures Procedures (including critical care time)  Medications Ordered in UC Medications - No data to display  Initial Impression / Assessment and Plan / UC Course  I have reviewed the triage vital signs and the nursing notes.  Pertinent labs & imaging results that were available during my care of the patient were reviewed by me and considered in my medical decision making (see chart for details).  Patient is here for evaluation of COVID-like symptoms that began 2 days ago.  Patient had not been running a fever until this point but she is febrile in clinic at 101.2.  Patient is nasal mucosa is erythematous and edematous with some clear nasal discharge.  Posterior oropharynx is pink and moist without postnasal drip.  No cervical lymphadenopathy.   Lungs are clear to auscultation all fields.  Patient had requested a Z-Pak and 5 days of prednisone.  Explained to patient that antibiotics are not recommended in this instance as her symptoms are most likely viral.  Patient also advised that prednisone might decrease her immune system and make her condition worse.  Patient then inquired about monoclonal antibodies.  Informed patient that if she is Covid positive she will be referred to the infusion clinic where they will determine her eligibility for monoclonal antibody infusion.  Will give patient Tessalon Perles and Promethazine DM to help with cough and congestion, advised Tylenol and ibuprofen for fever control, and given ER precautions.   Final Clinical Impressions(s) / UC Diagnoses   Final diagnoses:  Viral URI with cough     Discharge Instructions     Isolate at home until the results of your Covid test are back.  If you test positive you will have to quarantine for 5 days from when your symptoms started.  After 5 days you can break quarantine if your symptoms have improved and you have not had a fever in 24 hours.  Use over-the-counter Tylenol and ibuprofen as needed for fever and pain.  Use the Tessalon Perles during the day for cough and the Promethazine DM at bedtime for cough and congestion.  If you develop shortness of breath-especially at rest, you are unable to speak in full sentences, or is a late sign your lips start turning blue you need to go to the ER for evaluation.    ED Prescriptions    Medication Sig Dispense Auth. Provider   benzonatate (TESSALON) 100 MG capsule Take 2 capsules (200 mg total) by mouth every 8 (eight) hours. 21 capsule Margarette Canada, NP   promethazine-dextromethorphan (PROMETHAZINE-DM) 6.25-15 MG/5ML syrup Take 5 mLs by mouth 4 (four) times daily as needed. 118 mL Margarette Canada, NP     PDMP not reviewed this encounter.   Margarette Canada, NP 06/24/20 1241

## 2020-06-25 ENCOUNTER — Telehealth: Payer: Self-pay

## 2020-06-25 LAB — SARS CORONAVIRUS 2 (TAT 6-24 HRS): SARS Coronavirus 2: NEGATIVE

## 2020-06-25 NOTE — Telephone Encounter (Signed)
Called to discuss with patient about COVID-19 symptoms and the use of one of the available treatments for those with mild to moderate Covid symptoms and at a high risk of hospitalization.  Pt appears to qualify for outpatient treatment due to co-morbid conditions and/or a member of an at-risk group in accordance with the FDA Emergency Use Authorization.    Symptom onset: 06/22/20 per UC note Vaccinated: Yes Booster? No Immunocompromised? No Qualifiers: HTN, Pulmonary  Unable to reach pt - Left message and call back number [phone].  Madison Craig

## 2020-06-29 ENCOUNTER — Other Ambulatory Visit: Payer: Self-pay

## 2020-06-29 ENCOUNTER — Ambulatory Visit (LOCAL_COMMUNITY_HEALTH_CENTER): Payer: Medicare HMO

## 2020-06-29 DIAGNOSIS — Z111 Encounter for screening for respiratory tuberculosis: Secondary | ICD-10-CM

## 2020-07-02 ENCOUNTER — Ambulatory Visit (LOCAL_COMMUNITY_HEALTH_CENTER): Payer: Medicare HMO

## 2020-07-02 ENCOUNTER — Other Ambulatory Visit: Payer: Self-pay

## 2020-07-02 DIAGNOSIS — Z111 Encounter for screening for respiratory tuberculosis: Secondary | ICD-10-CM

## 2020-07-02 LAB — TB SKIN TEST
Induration: 0 mm
TB Skin Test: NEGATIVE

## 2020-07-30 ENCOUNTER — Other Ambulatory Visit: Payer: Self-pay | Admitting: Family Medicine

## 2020-07-31 ENCOUNTER — Other Ambulatory Visit: Payer: Self-pay | Admitting: Family Medicine

## 2020-09-04 ENCOUNTER — Telehealth: Payer: Self-pay

## 2020-09-04 ENCOUNTER — Ambulatory Visit: Payer: Medicare HMO

## 2020-09-04 NOTE — Telephone Encounter (Signed)
Pt dropped off Health Assessment form to be filled out for her job. Please fax to Ilda Foil @ [phone]. Placed in folder up front

## 2020-09-05 NOTE — Telephone Encounter (Signed)
Lvm for the patient to call back.  Patient needs a f/up visit with the provider to get from filled out.  Alyn Jurney,cma

## 2020-09-05 NOTE — Telephone Encounter (Signed)
Staff health assessment form was placed in the sign basket.  Kile Kabler,cma

## 2020-09-05 NOTE — Telephone Encounter (Signed)
It has been 5 months since I saw her last. Can we get her in for a visit for this form to ensure that nothing has changed?

## 2020-09-06 NOTE — Telephone Encounter (Signed)
PT called office back PT will wait for 5/13 appt to have form filled out

## 2020-09-06 NOTE — Telephone Encounter (Signed)
Noted  

## 2020-09-17 ENCOUNTER — Other Ambulatory Visit: Payer: Self-pay | Admitting: Family Medicine

## 2020-09-26 DIAGNOSIS — Z0100 Encounter for examination of eyes and vision without abnormal findings: Secondary | ICD-10-CM | POA: Diagnosis not present

## 2020-09-26 DIAGNOSIS — H31011 Macula scars of posterior pole (postinflammatory) (post-traumatic), right eye: Secondary | ICD-10-CM | POA: Diagnosis not present

## 2020-09-26 DIAGNOSIS — H524 Presbyopia: Secondary | ICD-10-CM | POA: Diagnosis not present

## 2020-10-02 ENCOUNTER — Ambulatory Visit (INDEPENDENT_AMBULATORY_CARE_PROVIDER_SITE_OTHER): Payer: Medicare HMO

## 2020-10-02 VITALS — Ht 62.5 in | Wt 189.0 lb

## 2020-10-02 DIAGNOSIS — Z1231 Encounter for screening mammogram for malignant neoplasm of breast: Secondary | ICD-10-CM | POA: Diagnosis not present

## 2020-10-02 DIAGNOSIS — Z0000 Encounter for general adult medical examination without abnormal findings: Secondary | ICD-10-CM

## 2020-10-02 NOTE — Progress Notes (Signed)
Subjective:   Madison Craig is a 75 y.o. female who presents for Medicare Annual (Subsequent) preventive examination.  Review of Systems    No ROS.  Medicare Wellness Virtual Visit.  Visual/audio telehealth visit, UTA vital signs.   See social history for additional risk factors.   Cardiac Risk Factors include: advanced age (>25mn, >>21women);hypertension     Objective:    Today's Vitals   10/02/20 0822  Weight: 189 lb (85.7 kg)  Height: 5' 2.5" (1.588 m)   Body mass index is 34.02 kg/m.  Advanced Directives 10/02/2020 09/02/2019 08/27/2018 08/24/2017 04/24/2017 08/21/2016 07/13/2015  Does Patient Have a Medical Advance Directive? No No No No No No No  Would patient like information on creating a medical advance directive? No - Patient declined No - Patient declined Yes (MAU/Ambulatory/Procedural Areas - Information given) Yes (MAU/Ambulatory/Procedural Areas - Information given) - No - Patient declined No - patient declined information    Current Medications (verified) Outpatient Encounter Medications as of 10/02/2020  Medication Sig  . albuterol (VENTOLIN HFA) 108 (90 Base) MCG/ACT inhaler Inhale 2 puffs into the lungs every 4 (four) hours as needed for wheezing or shortness of breath.  . benzonatate (TESSALON) 100 MG capsule Take 2 capsules (200 mg total) by mouth every 8 (eight) hours.  . diclofenac (VOLTAREN) 75 MG EC tablet Take 1 tablet by mouth twice daily as needed  . ferrous sulfate (FERROUSUL) 325 (65 FE) MG tablet Take 1 tablet (325 mg total) by mouth 2 (two) times daily.  . Multiple Vitamins-Minerals (PRESERVISION AREDS PO) Take 1 capsule by mouth daily.  .Marland Kitchenomeprazole (PRILOSEC) 40 MG capsule Take 1 capsule by mouth once daily  . promethazine-dextromethorphan (PROMETHAZINE-DM) 6.25-15 MG/5ML syrup Take 5 mLs by mouth 4 (four) times daily as needed.   No facility-administered encounter medications on file as of 10/02/2020.    Allergies (verified) Patient has no  known allergies.   History: Past Medical History:  Diagnosis Date  . Anemia   . Arthritis   . Dyspnea 09/28/2013   Pulmonary function testing done on 02/09/2014 showed minimal airway obstruction with a lack of response to bronchodilators; her FEV1 was normal, the FEV1/FVC ratio and the FEF 25-75% were reduced; the airway resistance was normal; lung volumes were within normal limits; the diffusing capacity was high for the measured volumes.    .Marland KitchenGERD (gastroesophageal reflux disease)   . Heart murmur   . Heme + stool   . Macular degeneration 01/11/2014   [facility] [place]   . Multiple thyroid nodules    Thyroid ultrasound on 01/25/2014 showed a multinodular goiter, with a 1.5 cm nodule in the inferior left thyroid lobe that met the criteria for ultrasound-guided biopsy.  On 02/06/2014 an ultrasound guided needle aspirate biopsy was performed of the left inferior thyroid nodule; cytopathology findings were consistent with non-neoplastic goiter.     . Osteoporosis 09/18/2014   DXA 08/16/2014 showed osteopenia of right femur neck (T-score -2.3) and osteoporosis of left forearm radius (T-score -2.9).  Lumbar spine was not utilized due to advanced degenerative changes.   .Marland KitchenSLEEP APNEA 03/22/2009   [place], cannot sleep with it on.     Past Surgical History:  Procedure Laterality Date  . BREAST SURGERY  1988   breast biopsy  . CARDIAC CATHETERIZATION  2006   No Stents/[facility]  . COLONOSCOPY WITH PROPOFOL N/A 04/24/2017   Procedure: COLONOSCOPY WITH  PROPOFOL;  Surgeon: Virgel Manifold, MD;  Location[facility] ENDOSCOPY;  Service: Endoscopy;  Laterality: N/A;  . ESOPHAGOGASTRODUODENOSCOPY (EGD) WITH PROPOFOL N/A 04/24/2017   Procedure: ESOPHAGOGASTRODUODENOSCOPY (EGD) WITH PROPOFOL;  Surgeon: Virgel Manifold, MD;  Location: [facility] ENDOSCOPY;  Service: Endoscopy;  Laterality: N/A;   Family History  Problem Relation Age of  Onset  . Heart disease Father 76  . Sudden death Father   . Hypertension Father   . Hyperlipidemia Father   . Heart attack Father   . Diabetes Son 85  . Stroke Mother   . Hypertension Mother   . Dementia Mother   . Heart attack Mother   . Atrial fibrillation Mother   . Stroke Sister 37  . Alcohol abuse Sister   . Valvular heart disease Sister   . Atrial fibrillation Sister   . Aneurysm Sister   . Arrhythmia Brother   . Atrial fibrillation Brother   . Colon cancer Neg Hx   . Breast cancer Neg Hx    Social History   Socioeconomic History  . Marital status: Widowed    Spouse name: Not on file  . Number of children: 3  . Years of education: Not on file  . Highest education level: Not on file  Occupational History  . Not on file  Tobacco Use  . Smoking status: Former Smoker    Packs/day: 2.00    Years: 16.00    Pack years: 32.00    Types: Cigarettes    Quit date: 09/29/1978    Years since quitting: 42.0  . Smokeless tobacco: Never Used  Vaping Use  . Vaping Use: Never used  Substance and Sexual Activity  . Alcohol use: No  . Drug use: No  . Sexual activity: Never  Other Topics Concern  . Not on file  Social History Narrative  . Not on file   Social Determinants of Health   Financial Resource Strain: Not on file  Food Insecurity: No Food Insecurity  . Worried About Charity fundraiser in the Last Year: Never true  . Ran Out of Food in the Last Year: Never true  Transportation Needs: No Transportation Needs  . Lack of Transportation (Medical): No  . Lack of Transportation (Non-Medical): No  Physical Activity: Sufficiently Active  . Days of Exercise per Week: 5 days  . Minutes of Exercise per Session: 30 min  Stress: No Stress Concern Present  . Feeling of Stress : Not at all  Social Connections: Unknown  . Frequency of Communication with Friends and Family: More than three times a week  . Frequency of Social Gatherings with Friends and Family: More than  three times a week  . Attends Religious Services: Not on file  . Active Member of Clubs or Organizations: Not on file  . Attends Archivist Meetings: Not on file  . Marital Status: Not on file    Tobacco Counseling Counseling given: Not Answered   Clinical Intake:  Pre-visit preparation completed: Yes        Diabetes: No  How often do you need to have someone help you when you read instructions, pamphlets, or other written materials from your doctor or pharmacy?: 1 - Never    Interpreter Needed?: No      Activities of Daily Living In your present state of health, do you have any difficulty performing the following activities: 10/02/2020  Hearing? Y  Vision? N  Difficulty concentrating or making decisions? N  Walking or climbing stairs?  Y  Comment Paces self. Considering the use of a cane.  Dressing or bathing? N  Doing errands, shopping? N  Preparing Food and eating ? N  Using the Toilet? N  In the past six months, have you accidently leaked urine? N  Do you have problems with loss of bowel control? N  Managing your Medications? N  Managing your Finances? N  Housekeeping or managing your Housekeeping? N  Some recent data might be hidden    Patient Care Team: Leone Haven, MD as PCP - General (Family Medicine)  Indicate any recent Medical Services you may have received from other than [facility] providers in the past year (date may be approximate).     Assessment:   This is a routine wellness examination for Madison Craig.  I connected with Madison Craig today by telephone and verified that I am speaking with the correct person using two identifiers. Location patient: home Location provider: work Persons participating in the virtual visit: patient, Marine scientist.    I discussed the limitations, risks, security and privacy concerns of performing an evaluation and management service by telephone and the availability of in person appointments. The patient expressed  understanding and verbally consented to this telephonic visit.    Interactive audio and video telecommunications were attempted between this provider and patient, however failed, due to patient having technical difficulties OR patient did not have access to video capability.  We continued and completed visit with audio only.  Some vital signs may be absent or patient reported.   Hearing/Vision screen  Hearing Screening   125Hz  250Hz  500Hz  1000Hz  2000Hz  3000Hz  4000Hz  6000Hz  8000Hz   Right ear:           Left ear:           Comments: Patient does have difficulty hearing conversational tones. Hearing aid, not currently worn.  Vision Screening Comments: [facility]  Macular degeneration  Legally blind, R eye  Wears corrective lenses when reading  Visual acuity not assessed per patient preference since they have regular follow up with the ophthalmologist  Dietary issues and exercise activities discussed: Current Exercise Habits: Home exercise routine, Type of exercise: walking, Intensity: Mild  Regular diet Good water intake  Goals Addressed              This Visit's Progress     Patient Stated   .  Healthy lifestyle (pt-stated)        Stay active Healthy diet Stay hydrated       Depression Screen PHQ 2/9 Scores 10/02/2020 04/06/2020 09/02/2019 03/11/2019 08/27/2018 03/01/2018 08/24/2017  PHQ - 2 Score 0 0 0 0 0 0 0    Fall Risk Fall Risk  10/02/2020 04/06/2020 09/02/2019 03/11/2019 08/27/2018  Falls in the past year? 1 0 0 0 1  Comment - - - - None since last assessed by pcp for fall about 6 months ago.   Number falls in past yr: 0 0 - 0 0  Injury with Fall? 0 - - - -  Comment Seating gave way. Did not seek medical care. - - - -  Follow up Falls evaluation completed Falls evaluation completed Falls evaluation completed Falls evaluation completed Falls prevention discussed    FALL RISK PREVENTION PERTAINING TO THE HOME: Handrails in use when  climbing stairs? Yes Home free of loose throw rugs in walkways, pet beds, electrical cords, etc? Yes  Adequate lighting in your home to reduce risk of falls? Yes   ASSISTIVE DEVICES  UTILIZED TO PREVENT FALLS: Life alert? No  Use of a cane, walker or w/c? No  Grab bars in the bathroom? No  Shower chair or bench in shower? No  Elevated toilet seat or a handicapped toilet? No   TIMED UP AND GO: Was the test performed? No .   Cognitive Function: Patient is alert and oriented x3. Enjoys working with children and other brain health activities.  Denies difficulty focusing, making decisions, memory loss.  MMSE/6CIT deferred. Normal by direct communication/observation.   MMSE - Mini Mental State Exam 09/02/2019 08/24/2017 08/21/2016  Not completed: Unable to complete - -  Orientation to time - 5 5  Orientation to Place - 5 5  Registration - 3 3  Attention/ Calculation - 5 5  Recall - 2 3  Language- name 2 objects - 2 2  Language- repeat - 1 1  Language- follow 3 step command - 3 3  Language- read & follow direction - 1 1  Write a sentence - 1 1  Copy design - 1 1  Total score - 29 30     6CIT Screen 08/27/2018  What Year? 0 points  What month? 0 points  What time? 0 points  Count back from 20 0 points  Months in reverse 0 points  Repeat phrase 0 points  Total Score 0    Immunizations Immunization History  Administered Date(s) Administered  . Fluad Quad(high Dose 65+) 03/11/2019  . Influenza,inj,Quad PF,6+ Mos 04/05/2014, 02/05/2015, 01/23/2016, 04/06/2020  . PFIZER(Purple Top)SARS-COV-2 Vaccination 01/16/2020, 02/09/2020  . PPD Test 06/29/2020  . Pneumococcal Conjugate-13 01/11/2014  . Pneumococcal Polysaccharide-23 12/03/2015  . Tdap 01/11/2014    Health Maintenance Health Maintenance  Topic Date Due  . COVID-19 Vaccine (3 - Booster for Pfizer series) 10/18/2020 (Originally 08/08/2020)  . COLONOSCOPY (Pts 45-73yr Insurance coverage will need to be confirmed)   10/03/2022 (Originally 04/24/2020)  . INFLUENZA VACCINE  12/24/2020  . MAMMOGRAM  11/02/2021  . TETANUS/TDAP  01/12/2024  . DEXA SCAN  Completed  . Hepatitis C Screening  Completed  . PNA vac Low Risk Adult  Completed  . HPV VACCINES  Aged Out    Colonoscopy- deferred. Patient reports due in 5 years from last completion verses 3. Follow.  Mammogram status: Completed 10/2019. Repeat every year. Ordered.  Lung Cancer Screening: (Low Dose CT Chest recommended if Age 73-80years, 30 pack-year currently smoking OR have quit w/in 15years.) does not qualify.   Vision Screening: Recommended annual ophthalmology exams for early detection of glaucoma and other disorders of the eye. Is the patient up to date with their annual eye exam?  Yes   Dental Screening: Recommended annual dental exams for proper oral hygiene.  Community Resource Referral / Chronic Care Management: CRR required this visit?  No   CCM required this visit?  No      Plan:   Keep all routine maintenance appointments.   Follow up 10/05/20  I have personally reviewed and noted the following in the patient's chart:   . Medical and social history . Use of alcohol, tobacco or illicit drugs  . Current medications and supplements including opioid prescriptions.  . Functional ability and status . Nutritional status . Physical activity . Advanced directives . List of other physicians . Hospitalizations, surgeries, and ER visits in previous 12 months . Vitals . Screenings to include cognitive, depression, and falls . Referrals and appointments  In addition, I have reviewed and discussed with patient certain preventive protocols, quality metrics, and  best practice recommendations. A written personalized care plan for preventive services as well as general preventive health recommendations were provided to patient.     Varney Biles, LPN   01/02/6538

## 2020-10-02 NOTE — Patient Instructions (Addendum)
Madison Craig , Thank you for taking time to come for your Medicare Wellness Visit. I appreciate your ongoing commitment to your health goals. Please review the following plan we discussed and let me know if I can assist you in the future.   These are the goals we discussed: Goals      Patient Stated   .  Healthy lifestyle (pt-stated)      Stay active Healthy diet Stay hydrated     .  Obtain Annual Eye (retinal)  Exam  (pt-stated)       This is a list of the screening recommended for you and due dates:  Health Maintenance  Topic Date Due  . COVID-19 Vaccine (3 - Booster for Pfizer series) 10/18/2020*  . Colon Cancer Screening  10/03/2022*  . Flu Shot  12/24/2020  . Mammogram  11/02/2021  . Tetanus Vaccine  01/12/2024  . DEXA scan (bone density measurement)  Completed  . Hepatitis C Screening: USPSTF Recommendation to screen - Ages 97-79 yo.  Completed  . Pneumonia vaccines  Completed  . HPV Vaccine  Aged Out  *Topic was postponed. The date shown is not the original due date.    Immunizations Immunization History  Administered Date(s) Administered  . Fluad Quad(high Dose 65+) 03/11/2019  . Influenza,inj,Quad PF,6+ Mos 04/05/2014, 02/05/2015, 01/23/2016, 04/06/2020  . PFIZER(Purple Top)SARS-COV-2 Vaccination 01/16/2020, 02/09/2020  . PPD Test 06/29/2020  . Pneumococcal Conjugate-13 01/11/2014  . Pneumococcal Polysaccharide-23 12/03/2015  . Tdap 01/11/2014     Mammogram A mammogram is a low energy X-ray of the breasts that is done to check for abnormal changes. This procedure can screen for and detect any changes that may indicate breast cancer. Mammograms are regularly done on women. A man may have a mammogram if he has a lump or swelling in his breast. A mammogram can also identify other changes and variations in the breast, such as:  Inflammation of the breast tissue (mastitis).  An infected area that contains a collection of pus (abscess).  A fluid-filled sac  (cyst).  Fibrocystic changes. This is when breast tissue becomes denser, which can make the tissue feel rope-like or uneven under the skin.  Tumors that are not cancerous (benign). Tell a health care provider:  About any allergies you have.  If you have breast implants.  If you have had previous breast disease, biopsy, or surgery.  If you are breastfeeding.  If you are younger than age 29.  If you have a family history of breast cancer.  Whether you are pregnant or may be pregnant. What are the risks? Generally, this is a safe procedure. However, problems may occur, including:  Exposure to radiation. Radiation levels are very low with this test.  The results being misinterpreted.  The need for further tests.  The inability of the mammogram to detect certain cancers. What happens before the procedure?  Schedule your test about 1-2 weeks after your menstrual period if you are still menstruating. This is usually when your breasts are the least tender.  If you have had a mammogram done at a different facility in the past, get the mammogram X-rays or have them sent to your current exam facility. The new and old images will be compared.  Wash your breasts and underarms on the day of the test.  Do not wear deodorants, perfumes, lotions, or powders anywhere on your body on the day of the test.  Remove any jewelry from your neck.  Wear clothes that you can change  into and out of easily. What happens during the procedure?  You will undress from the waist up and put on a gown that opens in the front.  You will stand in front of the X-ray machine.  Each breast will be placed between two plastic or glass plates. The plates will compress your breast for a few seconds. Try to stay as relaxed as possible during the procedure. This does not cause any harm to your breasts and any discomfort you feel will be very brief.  X-rays will be taken from different angles of each breast. The  procedure may vary among health care providers and hospitals.   What happens after the procedure?  The mammogram will be examined by a specialist (radiologist).  You may need to repeat certain parts of the test, depending on the quality of the images. This is commonly done if the radiologist needs a better view of the breast tissue.  You may resume your normal activities.  It is up to you to get the results of your procedure. Ask your health care provider, or the department that is doing the procedure, when your results will be ready. Summary  A mammogram is a low energy X-ray of the breasts that is done to check for abnormal changes. A man may have a mammogram if he has a lump or swelling in his breast.  If you have had a mammogram done at a different facility in the past, get the mammogram X-rays or have them sent to your current exam facility in order to compare them.  Schedule your test about 1-2 weeks after your menstrual period if you are still menstruating.  For this test, each breast will be placed between two plastic or glass plates. The plates will compress your breast for a few seconds.  Ask when your test results will be ready. Make sure you get your test results. This information is not intended to replace advice given to you by your health care provider. Make sure you discuss any questions you have with your health care provider. Document Revised: 12/31/2017 Document Reviewed: 12/31/2017 Elsevier Patient Education  [place].

## 2020-10-03 ENCOUNTER — Ambulatory Visit: Payer: Medicare HMO

## 2020-10-04 ENCOUNTER — Ambulatory Visit: Payer: Medicare HMO

## 2020-10-05 ENCOUNTER — Ambulatory Visit (INDEPENDENT_AMBULATORY_CARE_PROVIDER_SITE_OTHER): Payer: Medicare HMO | Admitting: Family Medicine

## 2020-10-05 ENCOUNTER — Telehealth: Payer: Self-pay | Admitting: Family Medicine

## 2020-10-05 ENCOUNTER — Other Ambulatory Visit: Payer: Self-pay

## 2020-10-05 ENCOUNTER — Encounter: Payer: Self-pay | Admitting: Family Medicine

## 2020-10-05 VITALS — BP 118/70 | HR 67 | Temp 98.7°F | Ht 62.5 in | Wt 183.2 lb

## 2020-10-05 DIAGNOSIS — D500 Iron deficiency anemia secondary to blood loss (chronic): Secondary | ICD-10-CM | POA: Diagnosis not present

## 2020-10-05 DIAGNOSIS — I8393 Asymptomatic varicose veins of bilateral lower extremities: Secondary | ICD-10-CM

## 2020-10-05 DIAGNOSIS — M1990 Unspecified osteoarthritis, unspecified site: Secondary | ICD-10-CM

## 2020-10-05 DIAGNOSIS — R42 Dizziness and giddiness: Secondary | ICD-10-CM | POA: Diagnosis not present

## 2020-10-05 DIAGNOSIS — Z1231 Encounter for screening mammogram for malignant neoplasm of breast: Secondary | ICD-10-CM

## 2020-10-05 DIAGNOSIS — S8991XA Unspecified injury of right lower leg, initial encounter: Secondary | ICD-10-CM | POA: Insufficient documentation

## 2020-10-05 DIAGNOSIS — Z1211 Encounter for screening for malignant neoplasm of colon: Secondary | ICD-10-CM | POA: Diagnosis not present

## 2020-10-05 DIAGNOSIS — M810 Age-related osteoporosis without current pathological fracture: Secondary | ICD-10-CM

## 2020-10-05 DIAGNOSIS — K219 Gastro-esophageal reflux disease without esophagitis: Secondary | ICD-10-CM

## 2020-10-05 HISTORY — DX: Unspecified injury of right lower leg, initial encounter: S89.91XA

## 2020-10-05 LAB — BASIC METABOLIC PANEL
BUN: 18 mg/dL (ref 6–23)
CO2: 30 mEq/L (ref 19–32)
Calcium: 9.1 mg/dL (ref 8.4–10.5)
Chloride: 103 mEq/L (ref 96–112)
Creatinine, Ser: 0.98 mg/dL (ref 0.40–1.20)
GFR: 57.38 mL/min — ABNORMAL LOW (ref >60.00)
Glucose, Bld: 94 mg/dL (ref 70–99)
Potassium: 4.4 mEq/L (ref 3.5–5.1)
Sodium: 139 mEq/L (ref 135–145)

## 2020-10-05 LAB — CBC
HCT: 33.8 % — ABNORMAL LOW (ref 36.0–46.0)
Hemoglobin: 11.4 g/dL — ABNORMAL LOW (ref 12.0–15.0)
MCHC: 33.7 g/dL (ref 30.0–36.0)
MCV: 88 fl (ref 78.0–100.0)
Platelets: 256 10*3/uL (ref 150.0–400.0)
RBC: 3.84 Mil/uL — ABNORMAL LOW (ref 3.87–5.11)
RDW: 14.1 % (ref 11.5–15.5)
WBC: 4.7 10*3/uL (ref 4.0–10.5)

## 2020-10-05 MED ORDER — FLUTICASONE PROPIONATE 50 MCG/ACT NA SUSP: 2.0000 | Freq: Every day | NASAL | 6 refills | Status: DC

## 2020-10-05 NOTE — Progress Notes (Signed)
Tommi Rumps, MD Phone: [phone]  Madison Craig is a 75 y.o. female who presents today for f/u.  Arthritis: Patient takes diclofenac typically once a day.  It does take the edge off and helps her function physically.  It allows her to work effectively.  She notes discomfort in her knees legs and hips.  She does follow with orthopedics though has not seen them in a while.  She was getting injections in her knees.  GERD: Continues on omeprazole.  No reflux, abdominal pain, blood in her stool, or dysphagia.  She does have a history of iron deficiency anemia.  She had very mild anemia on last check.  Osteoporosis: She is not on any medications for this.  She is due for a DEXA scan.  Varicose veins: She requests a referral to a different vascular surgeon.  Fall: The patient sat down in a chair and one of the legs broke.  She fell out of the chair due to this onto her right leg and noted soreness and discomfort in the right leg.  She was able to walk on this adequately.  She notes it has been progressively improving.  This occurred about 3 weeks ago.  Vertigo: Patient has intermittent issues with this.  She notes the only thing that is ever helped her is Flonase.  She has seen ENT for this and she notes they advised her there is nothing they can do to help her.  Social History   Tobacco Use  Smoking Status Former Smoker  . Packs/day: 2.00  . Years: 16.00  . Pack years: 32.00  . Types: Cigarettes  . Quit date: 09/29/1978  . Years since quitting: 42.0  Smokeless Tobacco Never Used    Current Outpatient Medications on File Prior to Visit  Medication Sig Dispense Refill  . albuterol (VENTOLIN HFA) 108 (90 Base) MCG/ACT inhaler Inhale 2 puffs into the lungs every 4 (four) hours as needed for wheezing or shortness of breath. 18 g 0  . benzonatate (TESSALON) 100 MG capsule Take 2 capsules (200 mg total) by mouth every 8 (eight) hours. 21 capsule 0  . diclofenac (VOLTAREN) 75 MG EC  tablet Take 1 tablet by mouth twice daily as needed 60 tablet 0  . Multiple Vitamins-Minerals (PRESERVISION AREDS PO) Take 1 capsule by mouth daily.    Marland Kitchen omeprazole (PRILOSEC) 40 MG capsule Take 1 capsule by mouth once daily 90 capsule 0  . promethazine-dextromethorphan (PROMETHAZINE-DM) 6.25-15 MG/5ML syrup Take 5 mLs by mouth 4 (four) times daily as needed. (Patient taking differently: Take 5 mLs by mouth 4 (four) times daily as needed.) 118 mL 0  . ferrous sulfate (FERROUSUL) 325 (65 FE) MG tablet Take 1 tablet (325 mg total) by mouth 2 (two) times daily. 60 tablet 3   No current facility-administered medications on file prior to visit.     ROS see history of present illness  Objective  Physical Exam Vitals:   10/05/20 0828  BP: 118/70  Pulse: 67  Temp: 98.7 F (37.1 C)  SpO2: 97%    BP Readings from Last 3 Encounters:  10/05/20 118/70  06/24/20 (!) 136/58  06/01/20 (!) 150/77   Wt Readings from Last 3 Encounters:  10/05/20 183 lb 3.2 oz (83.1 kg)  10/02/20 189 lb (85.7 kg)  06/24/20 189 lb (85.7 kg)    Physical Exam Constitutional:      General: She is not in acute distress.    Appearance: She is not diaphoretic.  HENT:  Right Ear: Tympanic membrane normal.     Left Ear: Tympanic membrane normal.  Cardiovascular:     Rate and Rhythm: Normal rate and regular rhythm.     Heart sounds: Normal heart sounds.  Pulmonary:     Effort: Pulmonary effort is normal.     Breath sounds: Normal breath sounds.  Musculoskeletal:     Right lower leg: No edema.     Left lower leg: No edema.     Comments: Right upper leg and hip with no tenderness or swelling, some limited internal range of motion bilateral hips with no discomfort, good external range of motion bilateral hips  Skin:    General: Skin is warm and dry.  Neurological:     Mental Status: She is alert.      Assessment/Plan: Please see individual problem list.  Problem List Items Addressed This Visit     Osteoarthritis    Chronic issue.  Stable.  Diclofenac is helpful.  She can continue the diclofenac 1 tablet by mouth twice daily as needed.      Relevant Orders   Basic Metabolic Panel (BMET)   GERD (gastroesophageal reflux disease) - Primary    Well-controlled.  She will continue omeprazole 40 mg once daily.  She will stay on this as long as she is on an anti-inflammatory.      Iron deficiency anemia due to chronic blood loss    Recheck CBC.      Relevant Orders   CBC   Osteoporosis    DEXA scan ordered.      Relevant Orders   DG Bone Density   Dizziness    Possibly related to eustachian tube dysfunction given improvement with Flonase.  Flonase was refilled.      Relevant Medications   fluticasone (FLONASE) 50 MCG/ACT nasal spray   Varicose veins of both lower extremities    Refer to vascular surgery in [place].      Relevant Orders   Ambulatory referral to Vascular Surgery   Right leg injury    Related to a chair breaking underneath her.  She has no pain on exam.  She is progressively improving.  She will monitor.       Other Visit Diagnoses    Encounter for screening mammogram for malignant neoplasm of breast       Relevant Orders   MM 3D SCREEN BREAST BILATERAL   Colon cancer screening       Relevant Orders   Ambulatory referral to Gastroenterology      Return in about 6 months (around 04/07/2021).  This visit occurred during the SARS-CoV-2 public health emergency.  Safety protocols were in place, including screening questions prior to the visit, additional usage of staff PPE, and extensive cleaning of exam room while observing appropriate contact time as indicated for disinfecting solutions.    Tommi Rumps, MD [place]

## 2020-10-05 NOTE — Patient Instructions (Signed)
Nice to see you. I will get you referred to vascular surgery. Please contact [phone] to schedule your mammogram and bone density scan. GI will contact you to get a colonoscopy scheduled. Please continue with the Flonase.

## 2020-10-05 NOTE — Assessment & Plan Note (Signed)
Related to a chair breaking underneath her.  She has no pain on exam.  She is progressively improving.  She will monitor.

## 2020-10-05 NOTE — Telephone Encounter (Signed)
.  Triagebur

## 2020-10-05 NOTE — Assessment & Plan Note (Signed)
Refer to vascular surgery in [place].

## 2020-10-05 NOTE — Assessment & Plan Note (Signed)
Possibly related to eustachian tube dysfunction given improvement with Flonase.  Flonase was refilled.

## 2020-10-05 NOTE — Assessment & Plan Note (Signed)
Recheck CBC. 

## 2020-10-05 NOTE — Assessment & Plan Note (Signed)
Chronic issue.  Stable.  Diclofenac is helpful.  She can continue the diclofenac 1 tablet by mouth twice daily as needed.

## 2020-10-05 NOTE — Assessment & Plan Note (Signed)
Well-controlled.  She will continue omeprazole 40 mg once daily.  She will stay on this as long as she is on an anti-inflammatory.

## 2020-10-05 NOTE — Assessment & Plan Note (Signed)
-   DEXA scan ordered

## 2020-10-12 ENCOUNTER — Other Ambulatory Visit: Payer: Self-pay | Admitting: *Deleted

## 2020-10-12 DIAGNOSIS — I8393 Asymptomatic varicose veins of bilateral lower extremities: Secondary | ICD-10-CM

## 2020-10-16 ENCOUNTER — Other Ambulatory Visit: Payer: Self-pay | Admitting: Family Medicine

## 2020-10-16 DIAGNOSIS — N179 Acute kidney failure, unspecified: Secondary | ICD-10-CM

## 2020-10-16 DIAGNOSIS — D509 Iron deficiency anemia, unspecified: Secondary | ICD-10-CM

## 2020-10-18 ENCOUNTER — Ambulatory Visit
Admission: RE | Admit: 2020-10-18 | Discharge: 2020-10-18 | Disposition: A | Discharge status: Home / Self Care | Payer: Medicare HMO | Source: Ambulatory Visit | Attending: Family Medicine | Admitting: Family Medicine

## 2020-10-18 ENCOUNTER — Other Ambulatory Visit: Payer: Self-pay

## 2020-10-18 DIAGNOSIS — Z780 Asymptomatic menopausal state: Secondary | ICD-10-CM | POA: Diagnosis not present

## 2020-10-18 DIAGNOSIS — M810 Age-related osteoporosis without current pathological fracture: Secondary | ICD-10-CM | POA: Diagnosis not present

## 2020-10-18 DIAGNOSIS — Z1231 Encounter for screening mammogram for malignant neoplasm of breast: Secondary | ICD-10-CM

## 2020-10-19 ENCOUNTER — Other Ambulatory Visit: Payer: Self-pay | Admitting: Dermatology

## 2020-10-24 ENCOUNTER — Telehealth: Payer: Self-pay

## 2020-10-24 NOTE — Telephone Encounter (Signed)
-----   Message from Leone Haven, MD sent at 10/23/2020  9:14 AM EDT ----- Please let the patient know that her bone density scan revealed osteoporosis.  She would benefit from medication for this.  Given her reflux history it would be best to go with a medicine such as Prolia which is an in office injection that is once every 6 months.  If she is willing to start on this we could see if her insurance would approve this.  Please find out if she is on vitamin D and calcium supplement.  She would need to have lab work to check her vitamin D level prior to starting this medication.

## 2020-10-24 NOTE — Telephone Encounter (Signed)
I called and LVM for the patient to call back for bone density results.  Franki Stemen,cma

## 2020-10-25 ENCOUNTER — Telehealth: Payer: Self-pay

## 2020-10-25 DIAGNOSIS — M810 Age-related osteoporosis without current pathological fracture: Secondary | ICD-10-CM

## 2020-10-25 NOTE — Telephone Encounter (Signed)
Noted. Thanks for ordering the vitamin D. I will start the process for prolia once I see the vitamin D level.

## 2020-10-25 NOTE — Telephone Encounter (Signed)
-----   Message from Leone Haven, MD sent at 10/23/2020  9:14 AM EDT ----- Please let the patient know that her bone density scan revealed osteoporosis.  She would benefit from medication for this.  Given her reflux history it would be best to go with a medicine such as Prolia which is an in office injection that is once every 6 months.  If she is willing to start on this we could see if her insurance would approve this.  Please find out if she is on vitamin D and calcium supplement.  She would need to have lab work to check her vitamin D level prior to starting this medication.

## 2020-10-26 ENCOUNTER — Other Ambulatory Visit: Payer: Self-pay

## 2020-10-26 ENCOUNTER — Ambulatory Visit (INDEPENDENT_AMBULATORY_CARE_PROVIDER_SITE_OTHER): Payer: Medicare HMO | Admitting: Family Medicine

## 2020-10-26 DIAGNOSIS — N179 Acute kidney failure, unspecified: Secondary | ICD-10-CM | POA: Diagnosis not present

## 2020-10-26 DIAGNOSIS — M810 Age-related osteoporosis without current pathological fracture: Secondary | ICD-10-CM | POA: Diagnosis not present

## 2020-10-26 DIAGNOSIS — D509 Iron deficiency anemia, unspecified: Secondary | ICD-10-CM

## 2020-10-26 LAB — CBC
HCT: 36.3 % (ref 36.0–46.0)
Hemoglobin: 12.1 g/dL (ref 12.0–15.0)
MCHC: 33.2 g/dL (ref 30.0–36.0)
MCV: 88.8 fl (ref 78.0–100.0)
Platelets: 236 10*3/uL (ref 150.0–400.0)
RBC: 4.09 Mil/uL (ref 3.87–5.11)
RDW: 14.6 % (ref 11.5–15.5)
WBC: 7.2 10*3/uL (ref 4.0–10.5)

## 2020-10-26 LAB — VITAMIN D 25 HYDROXY (VIT D DEFICIENCY, FRACTURES): VITD: 19.73 ng/mL — ABNORMAL LOW (ref 30.00–100.00)

## 2020-10-26 NOTE — Progress Notes (Signed)
Inadvertently placed on my schedule for a lab appointment.

## 2020-10-29 LAB — BASIC METABOLIC PANEL
BUN: 22 mg/dL (ref 6–23)
CO2: 26 mEq/L (ref 19–32)
Calcium: 9.3 mg/dL (ref 8.4–10.5)
Chloride: 101 mEq/L (ref 96–112)
Creatinine, Ser: 1 mg/dL (ref 0.40–1.20)
GFR: 55.98 mL/min — ABNORMAL LOW (ref >60.00)
Glucose, Bld: 82 mg/dL (ref 70–99)
Potassium: 4.3 mEq/L (ref 3.5–5.1)
Sodium: 137 mEq/L (ref 135–145)

## 2020-10-29 LAB — IBC + FERRITIN
Ferritin: 21.7 ng/mL (ref 10.0–291.0)
Iron: 34 ug/dL — ABNORMAL LOW (ref 42–145)
Saturation Ratios: 8.3 % — ABNORMAL LOW (ref 20.0–50.0)
Transferrin: 291 mg/dL (ref 212.0–360.0)

## 2020-11-06 ENCOUNTER — Encounter: Payer: Self-pay | Admitting: Vascular Surgery

## 2020-11-06 ENCOUNTER — Ambulatory Visit (HOSPITAL_COMMUNITY)
Admission: RE | Admit: 2020-11-06 | Discharge: 2020-11-06 | Disposition: A | Discharge status: Home / Self Care | Payer: Medicare HMO | Source: Ambulatory Visit | Attending: Vascular Surgery | Admitting: Vascular Surgery

## 2020-11-06 ENCOUNTER — Ambulatory Visit: Payer: Medicare HMO | Admitting: Vascular Surgery

## 2020-11-06 ENCOUNTER — Other Ambulatory Visit: Payer: Self-pay

## 2020-11-06 DIAGNOSIS — I8393 Asymptomatic varicose veins of bilateral lower extremities: Secondary | ICD-10-CM | POA: Diagnosis not present

## 2020-11-06 DIAGNOSIS — I872 Venous insufficiency (chronic) (peripheral): Secondary | ICD-10-CM | POA: Diagnosis not present

## 2020-11-06 NOTE — Progress Notes (Signed)
Patient name: Madison Craig MRN: [id number] DOB: 02/08/1946 Sex: female  REASON FOR CONSULT: Evaluate lower extremity varicose veins  HPI: Madison Craig is a 75 y.o. female, with history of GERD and sleep apnea that presents for evaluation of lower extremity varicose veins.  She states these have been present for many years and she just recently mentioned them to her PCP who sent a referral.  Her main complaint is pain in the large varicosity behind the right calf.  She's had no previous lower extremity venous interventions.  She's had no history of DVT or trauma.  She has tried to wear a knee-high compression stocking that she bought at [employer] and it causes a lot of pain.  She feels her right leg is much worse than the left.  She also has severe arthritis in the right knee.  Past Medical History:  Diagnosis Date   Anemia    Arthritis    Benign neoplasm of ascending colon    Dyspnea 09/28/2013   Pulmonary function testing done on 02/09/2014 showed minimal airway obstruction with a lack of response to bronchodilators; her FEV1 was normal, the FEV1/FVC ratio and the FEF 25-75% were reduced; the airway resistance was normal; lung volumes were within normal limits; the diffusing capacity was high for the measured volumes.     GERD (gastroesophageal reflux disease)    Heart murmur    Heme + stool    Macular degeneration 01/11/2014   Followed at [facility] in [place], [place].    Multiple thyroid nodules    Thyroid ultrasound on 01/25/2014 showed a multinodular goiter, with a 1.5 cm nodule in the inferior left thyroid lobe that met the criteria for ultrasound-guided biopsy.  On 02/06/2014 an ultrasound guided needle aspirate biopsy was performed of the left inferior thyroid nodule; cytopathology findings were consistent with non-neoplastic goiter.      Osteoporosis 09/18/2014   DXA 08/16/2014 showed osteopenia of right femur neck (T-score -2.3) and osteoporosis of left forearm radius  (T-score -2.9).  Lumbar spine was not utilized due to advanced degenerative changes.    Reflux esophagitis    SLEEP APNEA 03/22/2009   Had sleep study in [place] about 10 years, has BiPAP, cannot sleep with it on.      Past Surgical History:  Procedure Laterality Date   BREAST SURGERY  1988   breast biopsy   CARDIAC CATHETERIZATION  2006   No Stents/[facility]   COLONOSCOPY WITH PROPOFOL N/A 04/24/2017   Procedure: COLONOSCOPY WITH PROPOFOL;  Surgeon: Virgel Manifold, MD;  Location: [facility] ENDOSCOPY;  Service: Endoscopy;  Laterality: N/A;   ESOPHAGOGASTRODUODENOSCOPY (EGD) WITH PROPOFOL N/A 04/24/2017   Procedure: ESOPHAGOGASTRODUODENOSCOPY (EGD) WITH PROPOFOL;  Surgeon: Virgel Manifold, MD;  Location: [facility] ENDOSCOPY;  Service: Endoscopy;  Laterality: N/A;   eye     cateract surgery    Family History  Problem Relation Age of Onset   Heart disease Father 80   Sudden death Father    Hypertension Father    Hyperlipidemia Father    Heart attack Father    Diabetes Son 82   Stroke Mother    Hypertension Mother    Dementia Mother    Heart attack Mother    Atrial fibrillation Mother    Stroke Sister 75   Alcohol abuse Sister    Valvular heart disease Sister    Atrial fibrillation Sister    Aneurysm Sister    Arrhythmia Brother    Atrial fibrillation Brother  Colon cancer Neg Hx    Breast cancer Neg Hx     SOCIAL HISTORY: Social History   Socioeconomic History   Marital status: Widowed    Spouse name: Not on file   Number of children: 3   Years of education: Not on file   Highest education level: Not on file  Occupational History   Not on file  Tobacco Use   Smoking status: Former    Packs/day: 2.00    Years: 16.00    Pack years: 32.00    Types: Cigarettes    Quit date: 09/29/1978    Years since quitting: 42.1   Smokeless tobacco: Never  Vaping Use   Vaping Use: Never used  Substance and Sexual Activity   Alcohol use: No   Drug use: No   Sexual  activity: Never  Other Topics Concern   Not on file  Social History Narrative   Not on file   Social Determinants of Health   Financial Resource Strain: Not on file  Food Insecurity: No Food Insecurity   Worried About [place] in the Last Year: Never true   [place] in the Last Year: Never true  Transportation Needs: No Transportation Needs   Lack of Transportation (Medical): No   Lack of Transportation (Non-Medical): No  Physical Activity: Sufficiently Active   Days of Exercise per Week: 5 days   Minutes of Exercise per Session: 30 min  Stress: No Stress Concern Present   Feeling of Stress : Not at all  Social Connections: Unknown   Frequency of Communication with Friends and Family: More than three times a week   Frequency of Social Gatherings with Friends and Family: More than three times a week   Attends Religious Services: Not on Electrical engineer or Organizations: Not on file   Attends Archivist Meetings: Not on file   Marital Status: Not on file  Intimate Partner Violence: Not At Risk   Fear of Current or Ex-Partner: No   Emotionally Abused: No   Physically Abused: No   Sexually Abused: No    No Known Allergies  Current Outpatient Medications  Medication Sig Dispense Refill   diclofenac (VOLTAREN) 75 MG EC tablet Take 1 tablet by mouth twice daily as needed 60 tablet 0   ferrous sulfate (FERROUSUL) 325 (65 FE) MG tablet Take 1 tablet (325 mg total) by mouth 2 (two) times daily. 60 tablet 3   omeprazole (PRILOSEC) 40 MG capsule Take 1 capsule by mouth once daily 90 capsule 0   albuterol (VENTOLIN HFA) 108 (90 Base) MCG/ACT inhaler Inhale 2 puffs into the lungs every 4 (four) hours as needed for wheezing or shortness of breath. (Patient not taking: Reported on 11/06/2020) 18 g 0   benzonatate (TESSALON) 100 MG capsule Take 2 capsules (200 mg total) by mouth every 8 (eight) hours. (Patient not taking: Reported on 11/06/2020) 21  capsule 0   fluticasone (FLONASE) 50 MCG/ACT nasal spray Place 2 sprays into both nostrils daily. (Patient not taking: Reported on 11/06/2020) 16 g 6   Multiple Vitamins-Minerals (PRESERVISION AREDS PO) Take 1 capsule by mouth daily. (Patient not taking: Reported on 11/06/2020)     promethazine-dextromethorphan (PROMETHAZINE-DM) 6.25-15 MG/5ML syrup Take 5 mLs by mouth 4 (four) times daily as needed. (Patient not taking: Reported on 11/06/2020) 118 mL 0   No current facility-administered medications for this visit.    REVIEW OF SYSTEMS:  [X] denotes positive  finding, [ ] denotes negative finding Cardiac  Comments:  Chest pain or chest pressure:    Shortness of breath upon exertion:    Short of breath when lying flat:    Irregular heart rhythm:        Vascular    Pain in calf, thigh, or hip brought on by ambulation:    Pain in feet at night that wakes you up from your sleep:     Blood clot in your veins:    Leg swelling:  x right      Pulmonary    Oxygen at home:    Productive cough:     Wheezing:         Neurologic    Sudden weakness in arms or legs:     Sudden numbness in arms or legs:     Sudden onset of difficulty speaking or slurred speech:    Temporary loss of vision in one eye:     Problems with dizziness:         Gastrointestinal    Blood in stool:     Vomited blood:         Genitourinary    Burning when urinating:     Blood in urine:        Psychiatric    Major depression:         Hematologic    Bleeding problems:    Problems with blood clotting too easily:        Skin    Rashes or ulcers:        Constitutional    Fever or chills:      PHYSICAL EXAM: Vitals:   11/06/20 1210  BP: (!) 156/82  Pulse: 61  Temp: (!) 97.2 F (36.2 C)  TempSrc: Skin  SpO2: 95%  Weight: 183 lb 9.6 oz (83.3 kg)  Height: 5' 2.5" (1.588 m)    GENERAL: The patient is a well-nourished female, in no acute distress. The vital signs are documented above. CARDIAC: There is a  regular rate and rhythm.  VASCULAR:  Palpable DP pulses bilaterally Large bilateral lower extremity varicosities including a large varicosity on the back of the right calf causing pain No skin thickening or ulcerations PULMONARY: No respiratory distress. ABDOMEN: Soft and non-tender. MUSCULOSKELETAL: There are no major deformities or cyanosis. NEUROLOGIC: No focal weakness or paresthesias are detected. SKIN: There are no ulcers or rashes noted. PSYCHIATRIC: The patient has a normal affect.  DATA:   Indications: Edema, and Pain.     Performing Technologist: Alvia Grove RVT      Examination Guidelines: A complete evaluation includes B-mode imaging,  spectral  Doppler, color Doppler, and power Doppler as needed of all accessible  portions  of each vessel. Bilateral testing is considered an integral part of a  complete  examination. Limited examinations for reoccurring indications may be  performed  as noted. The reflux portion of the exam is performed with the patient in  reverse Trendelenburg.  Significant venous reflux is defined as >500 ms in the superficial venous  system, and >1 second in the deep venous system.      Venous Reflux Times  +--------------+---------+------+-----------+------------+-----------------  ----+  RIGHT         Reflux NoRefluxReflux TimeDiameter cmsComments                                        Yes                                                 +--------------+---------+------+-----------+------------+-----------------  ----+  CFV                     yes   >1 second                                     +--------------+---------+------+-----------+------------+-----------------  ----+  FV mid        no                                                            +--------------+---------+------+-----------+------------+-----------------  ----+  Popliteal               yes   >1 second                                      +--------------+---------+------+-----------+------------+-----------------  ----+  GSV at [facility]              yes    >500 ms      0.73                            +--------------+---------+------+-----------+------------+-----------------  ----+  GSV prox thigh          yes    >500 ms      0.49                            +--------------+---------+------+-----------+------------+-----------------  ----+  GSV mid thigh           yes    >500 ms      0.49    Leaves fascia            +--------------+---------+------+-----------+------------+-----------------  ----+  GSV dist thigh          yes    >500 ms  0.34 / 0.54 out of fascia            +--------------+---------+------+-----------+------------+-----------------  ----+  GSV at knee             yes    >500 ms      0.46    varicosity out  of                                                          fascia                  +--------------+---------+------+-----------+------------+-----------------  ----+  GSV prox calf           yes    >500 ms  0.15 / 0.32 varicosity joins  out                                                       fascia                  +--------------+---------+------+-----------+------------+-----------------  ----+  GSV mid calf  no                            0.13                            +--------------+---------+------+-----------+------------+-----------------  ----+  SSV Pop Fossa                                       too small                +--------------+---------+------+-----------+------------+-----------------  ----+  SSV prox calf           yes    >500 ms      0.14                            +--------------+---------+------+-----------+------------+-----------------  ----+  SSV mid calf            yes    >500 ms      0.20    numerour  varicosities   +--------------+---------+------+-----------+------------+-----------------  ----+  AASV          no                            0.20                            +--------------+---------+------+-----------+------------+-----------------  ----+      +--------------+---------+------+-----------+------------+--------+  LEFT          Reflux NoRefluxReflux TimeDiameter cmsComments                          Yes                                   +--------------+---------+------+-----------+------------+--------+  CFV                     yes   >1 second                       +--------------+---------+------+-----------+------------+--------+  FV mid        no                                              +--------------+---------+------+-----------+------------+--------+  Popliteal               yes   >1 second                       +--------------+---------+------+-----------+------------+--------+  GSV at SFJ              yes    >500 ms      0.69              +--------------+---------+------+-----------+------------+--------+  GSV prox thighno                            0.56              +--------------+---------+------+-----------+------------+--------+  GSV mid thigh           yes    >500 ms      0.39              +--------------+---------+------+-----------+------------+--------+  GSV dist thighno                            0.30              +--------------+---------+------+-----------+------------+--------+  GSV at knee   no                            0.58              +--------------+---------+------+-----------+------------+--------+  GSV prox calf           yes    >500 ms      0.39              +--------------+---------+------+-----------+------------+--------+  GSV mid calf  no                            0.28              +--------------+---------+------+-----------+------------+--------+  SSV  Pop Fossa no                            0.21              +--------------+---------+------+-----------+------------+--------+  SSV prox calf           yes    >500 ms      0.21              +--------------+---------+------+-----------+------------+--------+  SSV mid calf            yes    >500 ms      0.22              +--------------+---------+------+-----------+------------+--------+  AASV                    yes    >500 ms      0.24              +--------------+---------+------+-----------+------------+--------+           Summary:  Right:  - No evidence of deep vein thrombosis seen in the right lower extremity,  from the common femoral through the popliteal veins.  - Venous reflux is noted in the right common femoral vein.  - Venous reflux is noted in the right sapheno-femoral junction.  - Venous reflux is noted in the right greater saphenous vein in the thigh.  - Venous reflux is noted in the right greater saphenous vein in the calf.  - Venous reflux is noted in the right popliteal vein.  - Venous reflux is noted in the right short saphenous vein.     Left:  - No evidence of deep vein thrombosis seen in the left lower extremity,  from the common femoral through the popliteal veins.  - Venous reflux is noted in the left common femoral vein.  - Venous reflux is noted in the left sapheno-femoral junction.  - Venous reflux is noted in the left greater saphenous vein in the thigh.  - Venous reflux is noted in the left greater saphenous vein in the calf.  - Venous reflux is noted in the left  popliteal vein.  - Venous reflux is noted in the left short saphenous vein.  - Venous reflux is noted in the proximal AASV,     *See table(s) above for measurements and observations.   Electronically signed by Monica Martinez MD on 11/06/2020 at 12:41:51 PM.  Assessment/Plan:  75 year old female presents with bilateral lower extremity venous insufficiency with CEAP  classification C3.  Her main complaint is the right leg where she appears to have long segment superficial reflux throughout the great saphenous vein including the saphenofemoral junction and she also has multiple varicosities including a large varicosity on the posterior right calf that is quite painful.  We have recommended conservative therapy with leg elevation, exercise, and compression.  She does not feel she can wear thigh-high compression given severe osteoarthritis in the right knee so we sized her for knee-high compression today.  I will bring her back in 3 months to see one of my partners and see if she may be a candidate for laser ablation and stab phlebectomies given her long segment superficial reflux and especially if no improvement with conservative therapy.   Marty Heck, MD [facility] Office: [phone]

## 2020-11-07 NOTE — Progress Notes (Signed)
LVM for patient to call back for lab results.  Adrain Butrick,cma

## 2020-11-08 ENCOUNTER — Telehealth: Payer: Self-pay

## 2020-11-08 DIAGNOSIS — R7989 Other specified abnormal findings of blood chemistry: Secondary | ICD-10-CM

## 2020-11-08 NOTE — Telephone Encounter (Signed)
Patient was given lab results and she is scheduled for a vitamin d recheck in 6 weeks. I placed the order.  Adin Laker,cma

## 2020-11-11 ENCOUNTER — Other Ambulatory Visit: Payer: Self-pay | Admitting: Family Medicine

## 2020-11-11 DIAGNOSIS — N179 Acute kidney failure, unspecified: Secondary | ICD-10-CM

## 2020-11-11 DIAGNOSIS — D500 Iron deficiency anemia secondary to blood loss (chronic): Secondary | ICD-10-CM

## 2020-11-11 DIAGNOSIS — E559 Vitamin D deficiency, unspecified: Secondary | ICD-10-CM

## 2020-11-11 MED ORDER — VITAMIN D (ERGOCALCIFEROL) 1.25 MG (50000 UNIT) PO CAPS: 50000.0000 [IU] | ORAL_CAPSULE | ORAL | 0 refills | Status: DC

## 2020-11-16 ENCOUNTER — Telehealth: Payer: Self-pay | Admitting: *Deleted

## 2020-11-16 NOTE — Telephone Encounter (Signed)
-----   Message from Gordy Councilman, [place] sent at 10/25/2020  9:19 AM EDT ----- I called and spoke with the patient and informed her of her bone density results. Lab was ordered to check vitamin d level she has a lab appointment tomorrow and patient is in agreement to start prolia.  Nina,cma

## 2020-11-16 NOTE — Telephone Encounter (Signed)
Insurance verification for Prolia filed on [employer] Portal. 

## 2020-11-18 NOTE — Addendum Note (Signed)
Addended by: Caryl Bis Laine Giovanetti G on: 11/18/2020 11:26 AM   Modules accepted: Orders

## 2020-11-19 ENCOUNTER — Other Ambulatory Visit: Payer: Self-pay

## 2020-11-19 ENCOUNTER — Inpatient Hospital Stay: Payer: Medicare HMO

## 2020-11-19 ENCOUNTER — Inpatient Hospital Stay: Payer: Medicare HMO | Attending: Oncology | Admitting: Oncology

## 2020-11-19 ENCOUNTER — Encounter: Payer: Self-pay | Admitting: Oncology

## 2020-11-19 VITALS — BP 128/65 | HR 72 | Temp 97.0°F | Resp 18 | Wt 183.5 lb

## 2020-11-19 DIAGNOSIS — D509 Iron deficiency anemia, unspecified: Secondary | ICD-10-CM | POA: Insufficient documentation

## 2020-11-19 DIAGNOSIS — Z79899 Other long term (current) drug therapy: Secondary | ICD-10-CM | POA: Diagnosis not present

## 2020-11-19 DIAGNOSIS — G4730 Sleep apnea, unspecified: Secondary | ICD-10-CM | POA: Insufficient documentation

## 2020-11-19 NOTE — Telephone Encounter (Signed)
Ready to schedule first Prolia injection okay to schedule?

## 2020-11-19 NOTE — Progress Notes (Signed)
Patient here to establish care  

## 2020-11-19 NOTE — Progress Notes (Signed)
Hematology/Oncology Consult note [facility] Telephone:([phone] [phone]   Patient Care Team: Leone Haven, MD as PCP - General (Family Medicine)  REFERRING PROVIDER: Leone Haven, MD  CHIEF COMPLAINTS/REASON FOR VISIT:  Evaluation of anemia  HISTORY OF PRESENTING ILLNESS:  Madison Craig is a  75 y.o.  female with PMH listed below who was referred to me for evaluation of anemia Reviewed patient's recent labs that was done.  10/26/2020 labs revealed anemia with hemoglobin of 12.1, Iron 34, iron saturation 8.3 decreased, ferritin 21.7 Reviewed patient's previous labs ordered by primary care physician's office, anemia is intermittent and chronic duration is since 2010 No aggravating or improving factors.  Associated signs and symptoms: Patient reports fatigue.  SOB with exertion.  Denies weight loss, easy bruising, hematochezia, hemoptysis, hematuria. Context: Denies history of GI bleeding:                denies history of Chronic kidney disease               denies history of autoimmune disease                04/24/2017, upper endoscopy showed LA grade a reflux esophagitis.  Erythematous mucosa in the antrum.  Biopsied.  1 cm hiatal hernia.  Colonoscopy showed small polyps in the sigmoid colon and descending colon.  Resected and retrieved.  Diverticulosis.  Patient reports that capsule study was not covered by insurance company at that time so she did not have that done.  Patient takes oral iron supplementation chronically. Denies unintentional weight loss, night sweats, fever she has a history of sleep apnea and not tolerating CPAP Review of Systems  Constitutional:  Negative for appetite change, chills, fatigue and fever.  HENT:   Negative for hearing loss and voice change.   Eyes:  Negative for eye problems.  Respiratory:  Negative for chest tightness and cough.   Cardiovascular:  Negative for chest pain.   Gastrointestinal:  Negative for abdominal distention, abdominal pain and blood in stool.  Endocrine: Negative for hot flashes.  Genitourinary:  Negative for difficulty urinating and frequency.   Musculoskeletal:  Negative for arthralgias.  Skin:  Negative for itching and rash.  Neurological:  Negative for extremity weakness.  Hematological:  Negative for adenopathy.  Psychiatric/Behavioral:  Negative for confusion.     MEDICAL HISTORY:  Past Medical History:  Diagnosis Date   Anemia    Arthritis    Basal cell carcinoma    Benign neoplasm of ascending colon    Dyspnea 09/28/2013   Pulmonary function testing done on 02/09/2014 showed minimal airway obstruction with a lack of response to bronchodilators; her FEV1 was normal, the FEV1/FVC ratio and the FEF 25-75% were reduced; the airway resistance was normal; lung volumes were within normal limits; the diffusing capacity was high for the measured volumes.     GERD (gastroesophageal reflux disease)    Heart murmur    Heme + stool    Macular degeneration 01/11/2014   Followed at [facility] in [place], [place].    Multiple thyroid nodules    Thyroid ultrasound on 01/25/2014 showed a multinodular goiter, with a 1.5 cm nodule in the inferior left thyroid lobe that met the criteria for ultrasound-guided biopsy.  On 02/06/2014 an ultrasound guided needle aspirate biopsy was performed of the left inferior thyroid nodule; cytopathology findings were consistent with non-neoplastic goiter.      Osteoporosis 09/18/2014   DXA 08/16/2014 showed osteopenia of  right femur neck (T-score -2.3) and osteoporosis of left forearm radius (T-score -2.9).  Lumbar spine was not utilized due to advanced degenerative changes.    Reflux esophagitis    SLEEP APNEA 03/22/2009   Had sleep study in [place] about 10 years, has BiPAP, cannot sleep with it on.      SURGICAL HISTORY: Past Surgical History:  Procedure Laterality Date   BREAST SURGERY  1988   breast  biopsy   CARDIAC CATHETERIZATION  2006   No Stents/[facility]   COLONOSCOPY WITH PROPOFOL N/A 04/24/2017   Procedure: COLONOSCOPY WITH PROPOFOL;  Surgeon: Virgel Manifold, MD;  Location: [facility] ENDOSCOPY;  Service: Endoscopy;  Laterality: N/A;   ESOPHAGOGASTRODUODENOSCOPY (EGD) WITH PROPOFOL N/A 04/24/2017   Procedure: ESOPHAGOGASTRODUODENOSCOPY (EGD) WITH PROPOFOL;  Surgeon: Virgel Manifold, MD;  Location: [facility] ENDOSCOPY;  Service: Endoscopy;  Laterality: N/A;   eye     cateract surgery    SOCIAL HISTORY: Social History   Socioeconomic History   Marital status: Widowed    Spouse name: Not on file   Number of children: 3   Years of education: Not on file   Highest education level: Not on file  Occupational History   Not on file  Tobacco Use   Smoking status: Former    Packs/day: 2.00    Years: 16.00    Pack years: 32.00    Types: Cigarettes    Quit date: 09/28/1981    Years since quitting: 39.1   Smokeless tobacco: Never  Vaping Use   Vaping Use: Never used  Substance and Sexual Activity   Alcohol use: No   Drug use: No   Sexual activity: Never  Other Topics Concern   Not on file  Social History Narrative   Not on file   Social Determinants of Health   Financial Resource Strain: Not on file  Food Insecurity: No Food Insecurity   Worried About Running Out of Food in the Last Year: Never true   [place] in the Last Year: Never true  Transportation Needs: No Transportation Needs   Lack of Transportation (Medical): No   Lack of Transportation (Non-Medical): No  Physical Activity: Sufficiently Active   Days of Exercise per Week: 5 days   Minutes of Exercise per Session: 30 min  Stress: No Stress Concern Present   Feeling of Stress : Not at all  Social Connections: Unknown   Frequency of Communication with Friends and Family: More than three times a week   Frequency of Social Gatherings with Friends and Family: More than three times a week   Attends  Religious Services: Not on Electrical engineer or Organizations: Not on file   Attends Archivist Meetings: Not on file   Marital Status: Not on file  Intimate Partner Violence: Not At Risk   Fear of Current or Ex-Partner: No   Emotionally Abused: No   Physically Abused: No   Sexually Abused: No    FAMILY HISTORY: Family History  Problem Relation Age of Onset   Heart disease Father 48   Sudden death Father    Hypertension Father    Hyperlipidemia Father    Heart attack Father    Diabetes Son 51   Stroke Mother    Hypertension Mother    Dementia Mother    Heart attack Mother    Atrial fibrillation Mother    Stroke Sister 26   Alcohol abuse Sister    Valvular heart disease Sister  Atrial fibrillation Sister    Aneurysm Sister    Arrhythmia Brother    Atrial fibrillation Brother    Colon cancer Neg Hx    Breast cancer Neg Hx     ALLERGIES:  has No Known Allergies.  MEDICATIONS:  Current Outpatient Medications  Medication Sig Dispense Refill   diclofenac (VOLTAREN) 75 MG EC tablet Take 1 tablet by mouth twice daily as needed 60 tablet 0   ferrous sulfate (FERROUSUL) 325 (65 FE) MG tablet Take 1 tablet (325 mg total) by mouth 2 (two) times daily. 60 tablet 3   omeprazole (PRILOSEC) 40 MG capsule Take 1 capsule by mouth once daily 90 capsule 0   Vitamin D, Ergocalciferol, (DRISDOL) 1.25 MG (50000 UNIT) CAPS capsule Take 1 capsule (50,000 Units total) by mouth every 7 (seven) days. 8 capsule 0   albuterol (VENTOLIN HFA) 108 (90 Base) MCG/ACT inhaler Inhale 2 puffs into the lungs every 4 (four) hours as needed for wheezing or shortness of breath. (Patient not taking: No sig reported) 18 g 0   benzonatate (TESSALON) 100 MG capsule Take 2 capsules (200 mg total) by mouth every 8 (eight) hours. (Patient not taking: No sig reported) 21 capsule 0   fluticasone (FLONASE) 50 MCG/ACT nasal spray Place 2 sprays into both nostrils daily. (Patient not taking: No sig  reported) 16 g 6   Multiple Vitamins-Minerals (PRESERVISION AREDS PO) Take 1 capsule by mouth daily. (Patient not taking: No sig reported)     promethazine-dextromethorphan (PROMETHAZINE-DM) 6.25-15 MG/5ML syrup Take 5 mLs by mouth 4 (four) times daily as needed. (Patient not taking: No sig reported) 118 mL 0   No current facility-administered medications for this visit.     PHYSICAL EXAMINATION: ECOG PERFORMANCE STATUS: 1 - Symptomatic but completely ambulatory Vitals:   11/19/20 0930  BP: 128/65  Pulse: 72  Resp: 18  Temp: (!) 97 F (36.1 C)   Filed Weights   11/19/20 0930  Weight: 183 lb 8 oz (83.2 kg)    Physical Exam Constitutional:      General: She is not in acute distress. HENT:     Head: Normocephalic and atraumatic.  Eyes:     General: No scleral icterus. Cardiovascular:     Rate and Rhythm: Normal rate and regular rhythm.     Heart sounds: Normal heart sounds.  Pulmonary:     Effort: Pulmonary effort is normal. No respiratory distress.     Breath sounds: No wheezing.  Abdominal:     General: Bowel sounds are normal. There is no distension.     Palpations: Abdomen is soft.  Musculoskeletal:        General: No deformity. Normal range of motion.     Cervical back: Normal range of motion and neck supple.  Skin:    General: Skin is warm and dry.     Findings: No erythema or rash.  Neurological:     Mental Status: She is alert and oriented to person, place, and time. Mental status is at baseline.     Cranial Nerves: No cranial nerve deficit.     Coordination: Coordination normal.  Psychiatric:        Mood and Affect: Mood normal.     LABORATORY DATA:  I have reviewed the data as listed Lab Results  Component Value Date   WBC 7.2 10/26/2020   HGB 12.1 10/26/2020   HCT 36.3 10/26/2020   MCV 88.8 10/26/2020   PLT 236.0 10/26/2020   Recent Labs  04/06/20 0859 10/05/20 0858 10/26/20 0958  NA 137 139 137  K 4.3 4.4 4.3  CL 102 103 101  CO2 30  30 26   GLUCOSE 78 94 82  BUN 16 18 22   CREATININE 0.88 0.98 1.00  CALCIUM 9.2 9.1 9.3  PROT 7.0  --   --   ALBUMIN 4.1  --   --   AST 17  --   --   ALT 16  --   --   ALKPHOS 60  --   --   BILITOT 0.6  --   --    Iron/TIBC/Ferritin/ %Sat    Component Value Date/Time   IRON 34 (L) 10/26/2020 0958   TIBC 365 03/01/2018 1031   FERRITIN 21.7 10/26/2020 0958   FERRITIN 17 02/05/2015 0914   IRONPCTSAT 8.3 (L) 10/26/2020 0958   IRONPCTSAT 14 (L) 03/01/2018 1031        ASSESSMENT & PLAN:  1. Iron deficiency anemia, unspecified iron deficiency anemia type    Labs reviewed and discussed with patient.  Consistent with iron deficiency.  Hemoglobin has been intermittently slightly diminished.  Most recent hemoglobin was normal. Discussed with the patient that iron deficiency is most likely due to chronic blood loss versus overutilization secondary to compensation process from sleep apnea. I recommend patient to consider IV Venofer treatments and she agrees. Plan IV iron with Venofer 253m weekly x 2 doses. Allergy reactions/infusion reaction including anaphylactic reaction discussed with patient. Other side effects include but not limited to high blood pressure, skin rash, weight gain, leg swelling, etc. Patient voices understanding and willing to proceed.    Orders Placed This Encounter  Procedures   CBC with Differential    Standing Status:   Future    Standing Expiration Date:   11/19/2021   Iron and TIBC    Standing Status:   Future    Standing Expiration Date:   11/19/2021   Ferritin    Standing Status:   Future    Standing Expiration Date:   11/19/2021    All questions were answered. The patient knows to call the clinic with any problems questions or concerns. Cc. SLeone Haven MD  Return of visit: 8 weeks Thank you for this kind referral and the opportunity to participate in the care of this patient. A copy of today's   ZEarlie Server MD, PhD 11/19/2020

## 2020-11-20 NOTE — Telephone Encounter (Signed)
Her vitamin D level needs to come up prior to starting prolia. She is on prescription supplementation and should have this rechecked in August as scheduled.

## 2020-11-23 ENCOUNTER — Other Ambulatory Visit: Payer: Self-pay

## 2020-11-23 ENCOUNTER — Inpatient Hospital Stay: Payer: Medicare HMO | Attending: Oncology

## 2020-11-23 VITALS — BP 120/71 | HR 63 | Temp 96.5°F | Resp 16

## 2020-11-23 DIAGNOSIS — D509 Iron deficiency anemia, unspecified: Secondary | ICD-10-CM | POA: Insufficient documentation

## 2020-11-23 DIAGNOSIS — G4730 Sleep apnea, unspecified: Secondary | ICD-10-CM | POA: Diagnosis not present

## 2020-11-23 DIAGNOSIS — Z79899 Other long term (current) drug therapy: Secondary | ICD-10-CM | POA: Diagnosis not present

## 2020-11-23 DIAGNOSIS — D500 Iron deficiency anemia secondary to blood loss (chronic): Secondary | ICD-10-CM

## 2020-11-23 MED ORDER — SODIUM CHLORIDE 0.9 % IV SOLN: 200.0000 mg | Freq: Once | INTRAVENOUS | Status: DC

## 2020-11-23 MED ORDER — SODIUM CHLORIDE 0.9 % IV SOLN
Freq: Once | INTRAVENOUS | Status: AC
  Filled 2020-11-23: qty 250

## 2020-11-23 MED ORDER — IRON SUCROSE 20 MG/ML IV SOLN
200.0000 mg | Freq: Once | INTRAVENOUS | Status: AC
  Administered 2020-11-23: 200 mg via INTRAVENOUS
  Filled 2020-11-23: qty 10

## 2020-11-23 NOTE — Progress Notes (Signed)
Patient tolerated first Venofer infusion well. Patient monitored x 20 minutes post infusion. Patient and VSS. Discharged home.

## 2020-11-30 ENCOUNTER — Inpatient Hospital Stay: Payer: Medicare HMO

## 2020-11-30 VITALS — BP 136/56 | HR 65

## 2020-11-30 DIAGNOSIS — D509 Iron deficiency anemia, unspecified: Secondary | ICD-10-CM | POA: Diagnosis not present

## 2020-11-30 DIAGNOSIS — D500 Iron deficiency anemia secondary to blood loss (chronic): Secondary | ICD-10-CM

## 2020-11-30 DIAGNOSIS — Z79899 Other long term (current) drug therapy: Secondary | ICD-10-CM | POA: Diagnosis not present

## 2020-11-30 DIAGNOSIS — G4730 Sleep apnea, unspecified: Secondary | ICD-10-CM | POA: Diagnosis not present

## 2020-11-30 MED ORDER — SODIUM CHLORIDE 0.9 % IV SOLN: 200.0000 mg | Freq: Once | INTRAVENOUS | Status: DC

## 2020-11-30 MED ORDER — IRON SUCROSE 20 MG/ML IV SOLN
200.0000 mg | Freq: Once | INTRAVENOUS | Status: AC
  Administered 2020-11-30: 200 mg via INTRAVENOUS
  Filled 2020-11-30: qty 10

## 2020-11-30 MED ORDER — SODIUM CHLORIDE 0.9 % IV SOLN
Freq: Once | INTRAVENOUS | Status: AC
  Filled 2020-11-30: qty 250

## 2020-11-30 NOTE — Patient Instructions (Signed)
[facility] [place] [facility]  Discharge Instructions: Thank you for choosing [facility] to provide your oncology and hematology care.  If you have a lab appointment with the Cancer Center, please go directly to the Cancer Center and check in at the registration area.  Wear comfortable clothing and clothing appropriate for easy access to any Portacath or PICC line.   We strive to give you quality time with your provider. You may need to reschedule your appointment if you arrive late (15 or more minutes).  Arriving late affects you and other patients whose appointments are after yours.  Also, if you miss three or more appointments without notifying the office, you may be dismissed from the clinic at the provider's discretion.      For prescription refill requests, have your pharmacy contact our office and allow 72 hours for refills to be completed.    Today you received the following : Venofer   To help prevent nausea and vomiting after your treatment, we encourage you to take your nausea medication as directed.  BELOW ARE SYMPTOMS THAT SHOULD BE REPORTED IMMEDIATELY: . *FEVER GREATER THAN 100.4 F (38 C) OR HIGHER . *CHILLS OR SWEATING . *NAUSEA AND VOMITING THAT IS NOT CONTROLLED WITH YOUR NAUSEA MEDICATION . *UNUSUAL SHORTNESS OF BREATH . *UNUSUAL BRUISING OR BLEEDING . *URINARY PROBLEMS (pain or burning when urinating, or frequent urination) . *BOWEL PROBLEMS (unusual diarrhea, constipation, pain near the anus) . TENDERNESS IN MOUTH AND THROAT WITH OR WITHOUT PRESENCE OF ULCERS (sore throat, sores in mouth, or a toothache) . UNUSUAL RASH, SWELLING OR PAIN  . UNUSUAL VAGINAL DISCHARGE OR ITCHING   Items with * indicate a potential emergency and should be followed up as soon as possible or go to the Emergency Department if any problems should occur.  Please show the CHEMOTHERAPY ALERT CARD or IMMUNOTHERAPY ALERT CARD at check-in to the Emergency  Department and triage nurse.  Should you have questions after your visit or need to cancel or reschedule your appointment, please contact [facility] [place] [facility]  [phone] and follow the prompts.  Office hours are 8:00 a.m. to 4:30 p.m. Monday - Friday. Please note that voicemails left after 4:00 p.m. may not be returned until the following business day.  We are closed weekends and major holidays. You have access to a nurse at all times for urgent questions. Please call the main number to the clinic [phone] and follow the prompts.  For any non-urgent questions, you may also contact your provider using MyChart. We now offer e-Visits for anyone 18 and older to request care online for non-urgent symptoms. For details visit [email or web address].   Also download the MyChart app! Go to the app store, search "MyChart", open the app, select [facility], and log in with your MyChart username and password.  Due to Covid, a mask is required upon entering the hospital/clinic. If you do not have a mask, one will be given to you upon arrival. For doctor visits, patients may have 1 support person aged 18 or older with them. For treatment visits, patients cannot have anyone with them due to current Covid guidelines and our immunocompromised population.  

## 2020-12-05 NOTE — Telephone Encounter (Signed)
Please advise when ready I will have to get authorization again. According to Prolia guidelines for medicare patient must have acceptable vit D level and have discussed the side effects and risk of prolia and be documented in notes by the provider.

## 2020-12-14 ENCOUNTER — Other Ambulatory Visit: Payer: Self-pay | Admitting: Family Medicine

## 2020-12-20 ENCOUNTER — Ambulatory Visit
Admission: RE | Admit: 2020-12-20 | Discharge: 2020-12-20 | Disposition: A | Discharge status: Home / Self Care | Payer: Medicare HMO | Source: Ambulatory Visit | Attending: Family Medicine | Admitting: Family Medicine

## 2020-12-20 ENCOUNTER — Other Ambulatory Visit: Payer: Self-pay

## 2020-12-20 DIAGNOSIS — Z1231 Encounter for screening mammogram for malignant neoplasm of breast: Secondary | ICD-10-CM | POA: Diagnosis not present

## 2021-01-08 ENCOUNTER — Other Ambulatory Visit (INDEPENDENT_AMBULATORY_CARE_PROVIDER_SITE_OTHER): Payer: Medicare HMO

## 2021-01-08 ENCOUNTER — Other Ambulatory Visit: Payer: Self-pay

## 2021-01-08 DIAGNOSIS — R7989 Other specified abnormal findings of blood chemistry: Secondary | ICD-10-CM

## 2021-01-08 DIAGNOSIS — N179 Acute kidney failure, unspecified: Secondary | ICD-10-CM

## 2021-01-08 LAB — VITAMIN D 25 HYDROXY (VIT D DEFICIENCY, FRACTURES): VITD: 43.72 ng/mL (ref 30.00–100.00)

## 2021-01-14 ENCOUNTER — Telehealth: Payer: Self-pay | Admitting: *Deleted

## 2021-01-14 ENCOUNTER — Other Ambulatory Visit: Payer: Self-pay

## 2021-01-14 ENCOUNTER — Inpatient Hospital Stay: Payer: Medicare HMO | Attending: Oncology

## 2021-01-14 DIAGNOSIS — D509 Iron deficiency anemia, unspecified: Secondary | ICD-10-CM | POA: Diagnosis not present

## 2021-01-14 LAB — CBC WITH DIFFERENTIAL/PLATELET
Abs Immature Granulocytes: 0.01 10*3/uL (ref 0.00–0.07)
Basophils Absolute: 0 10*3/uL (ref 0.0–0.1)
Basophils Relative: 1 %
Eosinophils Absolute: 0.1 10*3/uL (ref 0.0–0.5)
Eosinophils Relative: 2 %
HCT: 38.3 % (ref 36.0–46.0)
Hemoglobin: 12.4 g/dL (ref 12.0–15.0)
Immature Granulocytes: 0 %
Lymphocytes Relative: 28 %
Lymphs Abs: 1.6 10*3/uL (ref 0.7–4.0)
MCH: 29.8 pg (ref 26.0–34.0)
MCHC: 32.4 g/dL (ref 30.0–36.0)
MCV: 92.1 fL (ref 80.0–100.0)
Monocytes Absolute: 0.4 10*3/uL (ref 0.1–1.0)
Monocytes Relative: 7 %
Neutro Abs: 3.6 10*3/uL (ref 1.7–7.7)
Neutrophils Relative %: 62 %
Platelets: 197 10*3/uL (ref 150–400)
RBC: 4.16 MIL/uL (ref 3.87–5.11)
RDW: 13.5 % (ref 11.5–15.5)
WBC: 5.8 10*3/uL (ref 4.0–10.5)
nRBC: 0 % (ref 0.0–0.2)

## 2021-01-14 LAB — IRON AND TIBC
Iron: 62 ug/dL (ref 28–170)
Saturation Ratios: 18 % (ref 10.4–31.8)
TIBC: 343 ug/dL (ref 250–450)
UIBC: 281 ug/dL

## 2021-01-14 LAB — FERRITIN: Ferritin: 70 ng/mL (ref 11–307)

## 2021-01-14 NOTE — Telephone Encounter (Signed)
-----   Message from Leone Haven, MD sent at 01/13/2021 10:52 AM EDT ----- Noted.  There is some risk to chronic daily use of the diclofenac with the potential for kidney injury.  Her kidney function was slightly worse than her prior baseline when it was previously checked.  It looks like this was supposed to be checked with her most recent set of labs though was not drawn.  Can we see if we can get her back again sometime in the next couple of weeks to recheck her kidney function with the order that was already placed?  She can use the diclofenac as prescribed as needed and she should try to limit the use of this though if her pain is significant she can take it.  We will need to see what her kidney function has in the next week or 2 with her taking this more frequently.

## 2021-01-14 NOTE — Telephone Encounter (Signed)
Left message to call back  

## 2021-01-15 ENCOUNTER — Inpatient Hospital Stay: Payer: Medicare HMO

## 2021-01-15 ENCOUNTER — Encounter: Payer: Self-pay | Admitting: Oncology

## 2021-01-15 ENCOUNTER — Inpatient Hospital Stay: Payer: Medicare HMO | Admitting: Oncology

## 2021-01-15 VITALS — BP 126/69 | HR 66 | Temp 99.1°F | Resp 16 | Ht 62.5 in | Wt 183.7 lb

## 2021-01-15 DIAGNOSIS — D500 Iron deficiency anemia secondary to blood loss (chronic): Secondary | ICD-10-CM | POA: Diagnosis not present

## 2021-01-15 DIAGNOSIS — D509 Iron deficiency anemia, unspecified: Secondary | ICD-10-CM | POA: Diagnosis not present

## 2021-01-15 NOTE — Progress Notes (Signed)
Hematology/Oncology progress note [facility] Telephone:([phone] Fax:([phone]   Patient Care Team: Leone Haven, MD as PCP - General (Family Medicine)  REFERRING PROVIDER: Leone Haven, MD  CHIEF COMPLAINTS/REASON FOR VISIT:  Follow-up for iron deficiency anemia  HISTORY OF PRESENTING ILLNESS:  Madison Craig is a  75 y.o.  female with PMH listed below who was referred to me for evaluation of anemia Reviewed patient's recent labs that was done.  10/26/2020 labs revealed anemia with hemoglobin of 12.1, Iron 34, iron saturation 8.3 decreased, ferritin 21.7 Reviewed patient's previous labs ordered by primary care physician's office, anemia is intermittent and chronic duration is since 2010 No aggravating or improving factors.  Associated signs and symptoms: Patient reports fatigue.  SOB with exertion.  Denies weight loss, easy bruising, hematochezia, hemoptysis, hematuria. Context: Denies history of GI bleeding:                denies history of Chronic kidney disease               denies history of autoimmune disease                04/24/2017, upper endoscopy showed LA grade a reflux esophagitis.  Erythematous mucosa in the antrum.  Biopsied.  1 cm hiatal hernia.  Colonoscopy showed small polyps in the sigmoid colon and descending colon.  Resected and retrieved.  Diverticulosis.  Patient reports that capsule study was not covered by insurance company at that time so she did not have that done.  Patient takes oral iron supplementation chronically. Denies unintentional weight loss, night sweats, fever she has a history of sleep apnea and not tolerating CPAP    INTERVAL HISTORY Madison Craig is a 75 y.o. female who has above history reviewed by me today presents for follow up visit for management of iron deficiency anemia Problems and complaints are listed below: Patient received IV Venofer weekly x2 during the interval.  She  reports tolerating the treatments.  She has developed bruising on her extremities postinfusion and she is concerned. She prefers no additional treatments.  Review of Systems  Constitutional:  Negative for appetite change, chills, fatigue and fever.  HENT:   Negative for hearing loss and voice change.   Eyes:  Negative for eye problems.  Respiratory:  Negative for chest tightness and cough.   Cardiovascular:  Negative for chest pain.  Gastrointestinal:  Negative for abdominal distention, abdominal pain and blood in stool.  Endocrine: Negative for hot flashes.  Genitourinary:  Negative for difficulty urinating and frequency.   Musculoskeletal:  Negative for arthralgias.  Skin:  Negative for itching and rash.  Neurological:  Negative for extremity weakness.  Hematological:  Negative for adenopathy.  Psychiatric/Behavioral:  Negative for confusion.     MEDICAL HISTORY:  Past Medical History:  Diagnosis Date   Anemia    Arthritis    Basal cell carcinoma    Benign neoplasm of ascending colon    Dyspnea 09/28/2013   Pulmonary function testing done on 02/09/2014 showed minimal airway obstruction with a lack of response to bronchodilators; her FEV1 was normal, the FEV1/FVC ratio and the FEF 25-75% were reduced; the airway resistance was normal; lung volumes were within normal limits; the diffusing capacity was high for the measured volumes.     GERD (gastroesophageal reflux disease)    Heart murmur    Heme + stool    Macular degeneration 01/11/2014   Followed at [facility] in [place],  [place].    Multiple thyroid nodules    Thyroid ultrasound on 01/25/2014 showed a multinodular goiter, with a 1.5 cm nodule in the inferior left thyroid lobe that met the criteria for ultrasound-guided biopsy.  On 02/06/2014 an ultrasound guided needle aspirate biopsy was performed of the left inferior thyroid nodule; cytopathology findings were consistent with non-neoplastic goiter.      Osteoporosis  09/18/2014   DXA 08/16/2014 showed osteopenia of right femur neck (T-score -2.3) and osteoporosis of left forearm radius (T-score -2.9).  Lumbar spine was not utilized due to advanced degenerative changes.    Reflux esophagitis    SLEEP APNEA 03/22/2009   Had sleep study in [place] about 10 years, has BiPAP, cannot sleep with it on.      SURGICAL HISTORY: Past Surgical History:  Procedure Laterality Date   BREAST SURGERY  1988   breast biopsy   CARDIAC CATHETERIZATION  2006   No Stents/[facility]   COLONOSCOPY WITH PROPOFOL N/A 04/24/2017   Procedure: COLONOSCOPY WITH PROPOFOL;  Surgeon: Virgel Manifold, MD;  Location: [facility] ENDOSCOPY;  Service: Endoscopy;  Laterality: N/A;   ESOPHAGOGASTRODUODENOSCOPY (EGD) WITH PROPOFOL N/A 04/24/2017   Procedure: ESOPHAGOGASTRODUODENOSCOPY (EGD) WITH PROPOFOL;  Surgeon: Virgel Manifold, MD;  Location: [facility] ENDOSCOPY;  Service: Endoscopy;  Laterality: N/A;   eye     cateract surgery    SOCIAL HISTORY: Social History   Socioeconomic History   Marital status: Widowed    Spouse name: Not on file   Number of children: 3   Years of education: Not on file   Highest education level: Not on file  Occupational History   Not on file  Tobacco Use   Smoking status: Former    Packs/day: 2.00    Years: 16.00    Pack years: 32.00    Types: Cigarettes    Quit date: 09/28/1981    Years since quitting: 39.3   Smokeless tobacco: Never  Vaping Use   Vaping Use: Never used  Substance and Sexual Activity   Alcohol use: No   Drug use: No   Sexual activity: Never  Other Topics Concern   Not on file  Social History Narrative   Not on file   Social Determinants of Health   Financial Resource Strain: Not on file  Food Insecurity: No Food Insecurity   Worried About Running Out of Food in the Last Year: Never true   [place] in the Last Year: Never true  Transportation Needs: No Transportation Needs   Lack of Transportation (Medical): No    Lack of Transportation (Non-Medical): No  Physical Activity: Sufficiently Active   Days of Exercise per Week: 5 days   Minutes of Exercise per Session: 30 min  Stress: No Stress Concern Present   Feeling of Stress : Not at all  Social Connections: Unknown   Frequency of Communication with Friends and Family: More than three times a week   Frequency of Social Gatherings with Friends and Family: More than three times a week   Attends Religious Services: Not on Electrical engineer or Organizations: Not on file   Attends Archivist Meetings: Not on file   Marital Status: Not on file  Intimate Partner Violence: Not At Risk   Fear of Current or Ex-Partner: No   Emotionally Abused: No   Physically Abused: No   Sexually Abused: No    FAMILY HISTORY: Family History  Problem Relation Age of Onset   Heart  disease Father 35   Sudden death Father    Hypertension Father    Hyperlipidemia Father    Heart attack Father    Diabetes Son 69   Stroke Mother    Hypertension Mother    Dementia Mother    Heart attack Mother    Atrial fibrillation Mother    Stroke Sister 78   Alcohol abuse Sister    Valvular heart disease Sister    Atrial fibrillation Sister    Aneurysm Sister    Arrhythmia Brother    Atrial fibrillation Brother    Colon cancer Neg Hx    Breast cancer Neg Hx     ALLERGIES:  has No Known Allergies.  MEDICATIONS:  Current Outpatient Medications  Medication Sig Dispense Refill   diclofenac (VOLTAREN) 75 MG EC tablet Take 1 tablet by mouth twice daily as needed 60 tablet 0   omeprazole (PRILOSEC) 40 MG capsule Take 1 capsule by mouth once daily 90 capsule 0   albuterol (VENTOLIN HFA) 108 (90 Base) MCG/ACT inhaler Inhale 2 puffs into the lungs every 4 (four) hours as needed for wheezing or shortness of breath. (Patient not taking: No sig reported) 18 g 0   benzonatate (TESSALON) 100 MG capsule Take 2 capsules (200 mg total) by mouth every 8 (eight) hours.  (Patient not taking: No sig reported) 21 capsule 0   ferrous sulfate (FERROUSUL) 325 (65 FE) MG tablet Take 1 tablet (325 mg total) by mouth 2 (two) times daily. (Patient not taking: Reported on 01/15/2021) 60 tablet 3   fluticasone (FLONASE) 50 MCG/ACT nasal spray Place 2 sprays into both nostrils daily. (Patient not taking: No sig reported) 16 g 6   Multiple Vitamins-Minerals (PRESERVISION AREDS PO) Take 1 capsule by mouth daily. (Patient not taking: No sig reported)     promethazine-dextromethorphan (PROMETHAZINE-DM) 6.25-15 MG/5ML syrup Take 5 mLs by mouth 4 (four) times daily as needed. (Patient not taking: No sig reported) 118 mL 0   Vitamin D, Ergocalciferol, (DRISDOL) 1.25 MG (50000 UNIT) CAPS capsule Take 1 capsule (50,000 Units total) by mouth every 7 (seven) days. (Patient not taking: Reported on 01/15/2021) 8 capsule 0   No current facility-administered medications for this visit.     PHYSICAL EXAMINATION: ECOG PERFORMANCE STATUS: 1 - Symptomatic but completely ambulatory Vitals:   01/15/21 0956  BP: 126/69  Pulse: 66  Resp: 16  Temp: 99.1 F (37.3 C)  SpO2: 98%   Filed Weights   01/15/21 0956  Weight: 183 lb 11.2 oz (83.3 kg)    Physical Exam Constitutional:      General: She is not in acute distress. HENT:     Head: Normocephalic and atraumatic.  Eyes:     General: No scleral icterus. Cardiovascular:     Rate and Rhythm: Normal rate and regular rhythm.     Heart sounds: Normal heart sounds.  Pulmonary:     Effort: Pulmonary effort is normal. No respiratory distress.     Breath sounds: No wheezing.  Abdominal:     General: Bowel sounds are normal. There is no distension.     Palpations: Abdomen is soft.  Musculoskeletal:        General: No deformity. Normal range of motion.     Cervical back: Normal range of motion and neck supple.  Skin:    General: Skin is warm and dry.     Findings: No erythema or rash.  Neurological:     Mental Status: She is alert and  oriented  to person, place, and time. Mental status is at baseline.     Cranial Nerves: No cranial nerve deficit.     Coordination: Coordination normal.  Psychiatric:        Mood and Affect: Mood normal.     LABORATORY DATA:  I have reviewed the data as listed Lab Results  Component Value Date   WBC 5.8 01/14/2021   HGB 12.4 01/14/2021   HCT 38.3 01/14/2021   MCV 92.1 01/14/2021   PLT 197 01/14/2021   Recent Labs    04/06/20 0859 10/05/20 0858 10/26/20 0958  NA 137 139 137  K 4.3 4.4 4.3  CL 102 103 101  CO2 30 30 26   GLUCOSE 78 94 82  BUN 16 18 22   CREATININE 0.88 0.98 1.00  CALCIUM 9.2 9.1 9.3  PROT 7.0  --   --   ALBUMIN 4.1  --   --   AST 17  --   --   ALT 16  --   --   ALKPHOS 60  --   --   BILITOT 0.6  --   --     Iron/TIBC/Ferritin/ %Sat    Component Value Date/Time   IRON 62 01/14/2021 1004   TIBC 343 01/14/2021 1004   FERRITIN 70 01/14/2021 1004   FERRITIN 17 02/05/2015 0914   IRONPCTSAT 18 01/14/2021 1004   IRONPCTSAT 14 (L) 03/01/2018 1031         ASSESSMENT & PLAN:  1. Iron deficiency anemia due to chronic blood loss    Labs reviewed and discussed with patient Hemoglobin and iron panel have both improved.  No additional IV Venofer treatments at this point. Patient prefers to follow-up with primary care provider for her blood count and iron panel.  All questions were answered. The patient knows to call the clinic with any problems questions or concerns. Cc. Leone Haven, MD  Return of visit: Discharge to follow-up with primary care provider Thank you for this kind referral and the opportunity to participate in the care of this patient. A copy of today's   Earlie Server, MD, PhD 01/15/2021

## 2021-01-23 ENCOUNTER — Other Ambulatory Visit: Payer: Self-pay | Admitting: Family Medicine

## 2021-02-01 ENCOUNTER — Other Ambulatory Visit: Payer: Self-pay

## 2021-02-01 ENCOUNTER — Other Ambulatory Visit (INDEPENDENT_AMBULATORY_CARE_PROVIDER_SITE_OTHER): Payer: Medicare HMO

## 2021-02-01 DIAGNOSIS — N179 Acute kidney failure, unspecified: Secondary | ICD-10-CM

## 2021-02-01 DIAGNOSIS — R7989 Other specified abnormal findings of blood chemistry: Secondary | ICD-10-CM

## 2021-02-01 LAB — BASIC METABOLIC PANEL
BUN: 23 mg/dL (ref 6–23)
CO2: 28 mEq/L (ref 19–32)
Calcium: 9.2 mg/dL (ref 8.4–10.5)
Chloride: 104 mEq/L (ref 96–112)
Creatinine, Ser: 0.98 mg/dL (ref 0.40–1.20)
GFR: 57.25 mL/min — ABNORMAL LOW (ref >60.00)
Glucose, Bld: 118 mg/dL — ABNORMAL HIGH (ref 70–99)
Potassium: 4.3 mEq/L (ref 3.5–5.1)
Sodium: 138 mEq/L (ref 135–145)

## 2021-02-13 ENCOUNTER — Ambulatory Visit: Payer: Medicare HMO | Admitting: Vascular Surgery

## 2021-02-13 ENCOUNTER — Other Ambulatory Visit: Payer: Self-pay

## 2021-02-13 ENCOUNTER — Encounter: Payer: Self-pay | Admitting: Vascular Surgery

## 2021-02-13 VITALS — BP 107/65 | HR 75 | Temp 97.2°F | Resp 18 | Ht 61.0 in | Wt 184.9 lb

## 2021-02-13 DIAGNOSIS — I872 Venous insufficiency (chronic) (peripheral): Secondary | ICD-10-CM

## 2021-02-13 DIAGNOSIS — I83811 Varicose veins of right lower extremities with pain: Secondary | ICD-10-CM

## 2021-02-13 NOTE — Progress Notes (Signed)
REASON FOR VISIT:   Follow-up of chronic venous insufficiency.  MEDICAL ISSUES:   CHRONIC VENOUS INSUFFICIENCY: This patient has CEAP C4c venous disease (corono phlebectatica).  We have again discussed the importance of intermittent leg elevation and the proper positioning for this.  She will continue to wear her compression stockings.  I encouraged her to avoid prolonged sitting and standing.  We discussed importance of exercise specifically walking and water aerobics.  I have also discussed the importance of maintaining a healthy weight as central obesity especially increases lower extremity venous pressure.  I think she would be a good candidate for laser ablation of the right great saphenous vein in the proximal thigh above where this gives off a large tributary feeding her varicose veins in the posterior leg and medial right leg.  I would cannulate the vein in the proximal to mid thigh above the takeoff of the large tributary.  I have discussed the indications for endovenous laser ablation of the right GSV, that is to lower the pressure in the veins and potentially help relieve the symptoms from venous hypertension.  I have also discussed alternative options such as conservative treatment as described above. I have discussed the potential complications of the procedure, including bleeding, bruising, leg swelling, deep venous thrombosis (<1% risk), or failure of the vein to close <1% risk).  I have also explained that venous insufficiency is a chronic disease, and that the patient is at risk for recurrent varicose veins in the future.  All of the patient's questions were encouraged and answered. They are agreeable to proceed.   I have discussed with the patient the indications for stab phlebectomy.  I have explained to the patient that that will have small scars from the stab incisions.  I explained that the other risks include bruising, bleeding, and phlebitis.     HPI:   Madison Craig is a pleasant 75 y.o. female who was seen by Dr. Carlis Abbott on 11/06/2020 with painful varicose veins bilaterally.  These have been present for many years.  Her biggest complaint is a large varicosity in her posterior right calf.  She is had no previous venous interventions.  She has no previous history of DVT.  She tried some knee-high compression stockings that she bought at [facility] but these did not help and actually made her pain worse.  Her symptoms are more significant on the right side.  On exam she had palpable dorsalis pedis pulses bilaterally.  She had CEAP C3 venous disease.  She has significant reflux in the right great saphenous vein.  Dr. Carlis Abbott recommended leg elevation, exercise, and compression stockings.  She has severe osteoarthritis of the right knee and felt that she could not tolerate thigh-high compression stockings.  Therefore she was fitted for a knee-high compression stocking with a gradient of 20 to 30 mmHg.  She comes in for 80-monthfollow-up visit.  She has been wearing her compression stockings but they hurt at the end of the day.  She is also been elevating her legs.  Her symptoms have not improved significantly.  She has no previous history of DVT and has had no previous venous procedures.  Her symptoms are worse at the end of the day.  Her symptoms are aggravated by standing and relieved with elevation.  Past Medical History:  Diagnosis Date   Anemia    Arthritis    Basal cell carcinoma    Benign neoplasm of ascending colon    Dyspnea 09/28/2013  Pulmonary function testing done on 02/09/2014 showed minimal airway obstruction with a lack of response to bronchodilators; her FEV1 was normal, the FEV1/FVC ratio and the FEF 25-75% were reduced; the airway resistance was normal; lung volumes were within normal limits; the diffusing capacity was high for the measured volumes.     GERD (gastroesophageal reflux disease)    Heart murmur    Heme + stool    Macular degeneration  01/11/2014   Followed at [facility] in [place], [place].    Multiple thyroid nodules    Thyroid ultrasound on 01/25/2014 showed a multinodular goiter, with a 1.5 cm nodule in the inferior left thyroid lobe that met the criteria for ultrasound-guided biopsy.  On 02/06/2014 an ultrasound guided needle aspirate biopsy was performed of the left inferior thyroid nodule; cytopathology findings were consistent with non-neoplastic goiter.      Osteoporosis 09/18/2014   DXA 08/16/2014 showed osteopenia of right femur neck (T-score -2.3) and osteoporosis of left forearm radius (T-score -2.9).  Lumbar spine was not utilized due to advanced degenerative changes.    Reflux esophagitis    SLEEP APNEA 03/22/2009   Had sleep study in [place] about 10 years, has BiPAP, cannot sleep with it on.      Family History  Problem Relation Age of Onset   Heart disease Father 83   Sudden death Father    Hypertension Father    Hyperlipidemia Father    Heart attack Father    Diabetes Son 50   Stroke Mother    Hypertension Mother    Dementia Mother    Heart attack Mother    Atrial fibrillation Mother    Stroke Sister 7   Alcohol abuse Sister    Valvular heart disease Sister    Atrial fibrillation Sister    Aneurysm Sister    Arrhythmia Brother    Atrial fibrillation Brother    Colon cancer Neg Hx    Breast cancer Neg Hx     SOCIAL HISTORY: Social History   Tobacco Use   Smoking status: Former    Packs/day: 2.00    Years: 16.00    Pack years: 32.00    Types: Cigarettes    Quit date: 09/28/1981    Years since quitting: 39.4   Smokeless tobacco: Never  Substance Use Topics   Alcohol use: No    No Known Allergies  Current Outpatient Medications  Medication Sig Dispense Refill   diclofenac (VOLTAREN) 75 MG EC tablet Take 1 tablet by mouth twice daily as needed 60 tablet 0   fluticasone (FLONASE) 50 MCG/ACT nasal spray Place 2 sprays into both nostrils daily. 16 g 6   omeprazole (PRILOSEC) 40  MG capsule Take 1 capsule by mouth once daily 90 capsule 0   Vitamin D, Ergocalciferol, (DRISDOL) 1.25 MG (50000 UNIT) CAPS capsule Take 1 capsule (50,000 Units total) by mouth every 7 (seven) days. 8 capsule 0   albuterol (VENTOLIN HFA) 108 (90 Base) MCG/ACT inhaler Inhale 2 puffs into the lungs every 4 (four) hours as needed for wheezing or shortness of breath. (Patient not taking: No sig reported) 18 g 0   benzonatate (TESSALON) 100 MG capsule Take 2 capsules (200 mg total) by mouth every 8 (eight) hours. (Patient not taking: No sig reported) 21 capsule 0   ferrous sulfate (FERROUSUL) 325 (65 FE) MG tablet Take 1 tablet (325 mg total) by mouth 2 (two) times daily. (Patient not taking: Reported on 01/15/2021) 60 tablet 3   Multiple Vitamins-Minerals (  PRESERVISION AREDS PO) Take 1 capsule by mouth daily. (Patient not taking: No sig reported)     promethazine-dextromethorphan (PROMETHAZINE-DM) 6.25-15 MG/5ML syrup Take 5 mLs by mouth 4 (four) times daily as needed. (Patient not taking: No sig reported) 118 mL 0   No current facility-administered medications for this visit.    REVIEW OF SYSTEMS:  [X]  denotes positive finding, [ ]  denotes negative finding Cardiac  Comments:  Chest pain or chest pressure:    Shortness of breath upon exertion:    Short of breath when lying flat:    Irregular heart rhythm:        Vascular    Pain in calf, thigh, or hip brought on by ambulation:    Pain in feet at night that wakes you up from your sleep:     Blood clot in your veins:    Leg swelling:         Pulmonary    Oxygen at home:    Productive cough:     Wheezing:         Neurologic    Sudden weakness in arms or legs:     Sudden numbness in arms or legs:     Sudden onset of difficulty speaking or slurred speech:    Temporary loss of vision in one eye:     Problems with dizziness:         Gastrointestinal    Blood in stool:     Vomited blood:         Genitourinary    Burning when urinating:      Blood in urine:        Psychiatric    Major depression:         Hematologic    Bleeding problems:    Problems with blood clotting too easily:        Skin    Rashes or ulcers:        Constitutional    Fever or chills:     PHYSICAL EXAM:   Vitals:   02/13/21 1446  BP: 107/65  Pulse: 75  Resp: 18  Temp: (!) 97.2 F (36.2 C)  TempSrc: Temporal  SpO2: 95%  Weight: 184 lb 14.4 oz (83.9 kg)  Height: 5' 1"  (1.549 m)    GENERAL: The patient is a well-nourished female, in no acute distress. The vital signs are documented above. CARDIAC: There is a regular rate and rhythm.  VASCULAR: I do not detect carotid bruits. She has palpable dorsalis pedis pulses. He has large dilated varicose veins of both legs.  She also has corona phlebectatica.        I did look at the right great saphenous vein myself with the SonoSite and I felt that we could cannulate this in the mid thigh just above worried gave off a large tributary and became superficial.  PULMONARY: There is good air exchange bilaterally without wheezing or rales. ABDOMEN: Soft and non-tender with normal pitched bowel sounds.  MUSCULOSKELETAL: There are no major deformities or cyanosis. NEUROLOGIC: No focal weakness or paresthesias are detected. SKIN: There are no ulcers or rashes noted. PSYCHIATRIC: The patient has a normal affect.  DATA:    VENOUS DUPLEX: I have reviewed the venous duplex scan that was done on 11/06/2020.  On the right side, which is the more symptomatic side, there is no evidence of DVT.  There was deep venous reflux involving the common femoral vein and popliteal vein.  There was superficial venous reflux in  the right great saphenous vein from the saphenofemoral junction to the proximal calf.  The vein did exit the fascia in the mid thigh.  Diameters in the proximal and mid thigh were 5 mm.    On the left side, there is no evidence of DVT.  There was deep venous reflux in the common femoral vein  and popliteal vein.  There were some short segments of reflux in the left great saphenous vein but the vein was not especially dilated.  [name] [facility] [phone]

## 2021-02-14 ENCOUNTER — Ambulatory Visit: Payer: Medicare HMO | Admitting: Vascular Surgery

## 2021-02-21 ENCOUNTER — Other Ambulatory Visit: Payer: Self-pay | Admitting: *Deleted

## 2021-02-21 ENCOUNTER — Encounter: Payer: Self-pay | Admitting: Vascular Surgery

## 2021-02-21 DIAGNOSIS — I83811 Varicose veins of right lower extremities with pain: Secondary | ICD-10-CM

## 2021-03-06 ENCOUNTER — Encounter: Payer: Self-pay | Admitting: Family Medicine

## 2021-03-06 ENCOUNTER — Other Ambulatory Visit: Payer: Self-pay

## 2021-03-06 ENCOUNTER — Ambulatory Visit (INDEPENDENT_AMBULATORY_CARE_PROVIDER_SITE_OTHER): Payer: Medicare HMO | Admitting: Family Medicine

## 2021-03-06 VITALS — BP 120/80 | HR 72 | Temp 98.3°F | Ht 61.0 in | Wt 185.0 lb

## 2021-03-06 DIAGNOSIS — M810 Age-related osteoporosis without current pathological fracture: Secondary | ICD-10-CM | POA: Diagnosis not present

## 2021-03-06 DIAGNOSIS — I8393 Asymptomatic varicose veins of bilateral lower extremities: Secondary | ICD-10-CM

## 2021-03-06 DIAGNOSIS — Z23 Encounter for immunization: Secondary | ICD-10-CM | POA: Diagnosis not present

## 2021-03-06 DIAGNOSIS — R011 Cardiac murmur, unspecified: Secondary | ICD-10-CM | POA: Diagnosis not present

## 2021-03-06 LAB — VITAMIN D 25 HYDROXY (VIT D DEFICIENCY, FRACTURES): VITD: 39.93 ng/mL (ref 30.00–100.00)

## 2021-03-06 LAB — BASIC METABOLIC PANEL
BUN: 20 mg/dL (ref 6–23)
CO2: 29 mEq/L (ref 19–32)
Calcium: 9.5 mg/dL (ref 8.4–10.5)
Chloride: 102 mEq/L (ref 96–112)
Creatinine, Ser: 0.85 mg/dL (ref 0.40–1.20)
GFR: 67.87 mL/min (ref >60.00)
Glucose, Bld: 108 mg/dL — ABNORMAL HIGH (ref 70–99)
Potassium: 4.1 mEq/L (ref 3.5–5.1)
Sodium: 137 mEq/L (ref 135–145)

## 2021-03-06 NOTE — Progress Notes (Signed)
Madison Rumps, MD Phone: [phone]  Madison Craig is a 75 y.o. female who presents today for f/u.  Osteoporosis: Patient notes no recent fractures.  She did break her left arm in the early 2000's after a significant fall.  She has not been on vitamin D since coming off of the prescription supplement.  She has a couple of cups of yogurt per day and 1 to 2 glasses of milk per day.  She was on Fosamax in the past and had significant headaches.  Varicose veins: Patient reports she is going to have laser surgery to help with this the day before Thanksgiving.  She is going to be in compression bandages after.  Social History   Tobacco Use  Smoking Status Former   Packs/day: 2.00   Years: 16.00   Pack years: 32.00   Types: Cigarettes   Quit date: 09/28/1981   Years since quitting: 39.4  Smokeless Tobacco Never    Current Outpatient Medications on File Prior to Visit  Medication Sig Dispense Refill   albuterol (VENTOLIN HFA) 108 (90 Base) MCG/ACT inhaler Inhale 2 puffs into the lungs every 4 (four) hours as needed for wheezing or shortness of breath. 18 g 0   fluticasone (FLONASE) 50 MCG/ACT nasal spray Place 2 sprays into both nostrils daily. 16 g 6   omeprazole (PRILOSEC) 40 MG capsule Take 1 capsule by mouth once daily 90 capsule 0   Vitamin D, Ergocalciferol, (DRISDOL) 1.25 MG (50000 UNIT) CAPS capsule Take 1 capsule (50,000 Units total) by mouth every 7 (seven) days. 8 capsule 0   diclofenac (VOLTAREN) 75 MG EC tablet Take 1 tablet by mouth twice daily as needed (Patient not taking: Reported on 03/06/2021) 60 tablet 0   No current facility-administered medications on file prior to visit.     ROS see history of present illness  Objective  Physical Exam Vitals:   03/06/21 0938  BP: 120/80  Pulse: 72  Temp: 98.3 F (36.8 C)  SpO2: 97%    BP Readings from Last 3 Encounters:  03/06/21 120/80  02/13/21 107/65  01/15/21 126/69   Wt Readings from Last 3  Encounters:  03/06/21 185 lb (83.9 kg)  02/13/21 184 lb 14.4 oz (83.9 kg)  01/15/21 183 lb 11.2 oz (83.3 kg)    Physical Exam Constitutional:      General: She is not in acute distress.    Appearance: She is not diaphoretic.  Cardiovascular:     Rate and Rhythm: Normal rate and regular rhythm.     Heart sounds: Murmur (2/6 left upper sternal border systolic) heard.  Pulmonary:     Effort: Pulmonary effort is normal.     Breath sounds: Normal breath sounds.  Skin:    General: Skin is warm and dry.  Neurological:     Mental Status: She is alert.     Assessment/Plan: Please see individual problem list.  Problem List Items Addressed This Visit     Murmur    Stable.  We will continue to monitor.      Osteoporosis - Primary    Discussed treatment options.  Fosamax is not an option given prior adverse effects.  Discussed Prolia as an option.  Discussed this is generally well-tolerated and is an injection once every 6 months.  Discussed that some patients notice joint and muscle aches while on this.  Discussed there is a small risk of osteonecrosis of the jaw though I did advise that I have never seen this and this  typically only occurs when there is a dental procedure.  She does not have any teeth of her own so this would seem to be less of a risk.  Discussed zoledronic acid as well.  She opts to proceed with Prolia.  We will work on getting this approved.  We will recheck vitamin D today.  Discussed the need for 1200 mg of calcium daily from diet or supplementation.  She will start on vitamin D 2000 international units daily.      Relevant Orders   Vitamin D (25 hydroxy)   Basic Metabolic Panel (BMET)   Varicose veins of both lower extremities    She will plan on having her procedure as planned.      Other Visit Diagnoses     Need for immunization against influenza       Relevant Orders   Flu Vaccine QUAD High Dose(Fluad) (Completed)       Return for As  scheduled.  This visit occurred during the SARS-CoV-2 public health emergency.  Safety protocols were in place, including screening questions prior to the visit, additional usage of staff PPE, and extensive cleaning of exam room while observing appropriate contact time as indicated for disinfecting solutions.    Madison Rumps, MD [place]

## 2021-03-06 NOTE — Patient Instructions (Signed)
Nice to see you. Please make sure you are getting 1200 mg of calcium daily from your diet and or supplementation.  It looks like you may be getting around 600 mg from your diet daily. Please start on vitamin D 2000 international units once daily. We will work on getting your Prolia approved.

## 2021-03-06 NOTE — Assessment & Plan Note (Signed)
Stable.  We will continue to monitor. 

## 2021-03-06 NOTE — Assessment & Plan Note (Signed)
She will plan on having her procedure as planned.

## 2021-03-06 NOTE — Assessment & Plan Note (Addendum)
Discussed treatment options.  Fosamax is not an option given prior adverse effects.  Discussed Prolia as an option.  Discussed this is generally well-tolerated and is an injection once every 6 months.  Discussed that some patients notice joint and muscle aches while on this.  Discussed there is a small risk of osteonecrosis of the jaw though I did advise that I have never seen this and this typically only occurs when there is a dental procedure.  She does not have any teeth of her own so this would seem to be less of a risk.  Discussed zoledronic acid as well.  She opts to proceed with Prolia.  We will work on getting this approved.  We will recheck vitamin D today.  Discussed the need for 1200 mg of calcium daily from diet or supplementation.  She will start on vitamin D 2000 international units daily.

## 2021-03-08 ENCOUNTER — Other Ambulatory Visit: Payer: Self-pay | Admitting: Family Medicine

## 2021-03-10 NOTE — Telephone Encounter (Signed)
Please find out if she has been taking the diclofenac and how often.

## 2021-03-15 ENCOUNTER — Telehealth: Payer: Self-pay | Admitting: Family Medicine

## 2021-03-15 NOTE — Telephone Encounter (Signed)
Left a message to call back.

## 2021-03-15 NOTE — Telephone Encounter (Signed)
Patient is calling in to see if a letter for proof of her her flu shot can be faxed to her job.She is unsure of their fax number but she does have their phone number [phone] and would like for you to call to get the fax number.Please advise.

## 2021-03-15 NOTE — Telephone Encounter (Signed)
Letter confirming flu vaccine was emailed to [email or web address].  Danaye Sobh,cma

## 2021-03-18 NOTE — Telephone Encounter (Signed)
Patient is taking the diclofenac and she is taking one tablet daily.

## 2021-03-18 NOTE — Telephone Encounter (Signed)
Left a message to call back.

## 2021-04-03 DIAGNOSIS — H1131 Conjunctival hemorrhage, right eye: Secondary | ICD-10-CM | POA: Diagnosis not present

## 2021-04-03 DIAGNOSIS — H31011 Macula scars of posterior pole (postinflammatory) (post-traumatic), right eye: Secondary | ICD-10-CM | POA: Diagnosis not present

## 2021-04-09 ENCOUNTER — Ambulatory Visit (INDEPENDENT_AMBULATORY_CARE_PROVIDER_SITE_OTHER): Payer: Medicare HMO | Admitting: Family Medicine

## 2021-04-09 ENCOUNTER — Encounter: Payer: Self-pay | Admitting: Family Medicine

## 2021-04-09 ENCOUNTER — Other Ambulatory Visit: Payer: Self-pay

## 2021-04-09 DIAGNOSIS — M1990 Unspecified osteoarthritis, unspecified site: Secondary | ICD-10-CM

## 2021-04-09 DIAGNOSIS — J449 Chronic obstructive pulmonary disease, unspecified: Secondary | ICD-10-CM

## 2021-04-09 DIAGNOSIS — J989 Respiratory disorder, unspecified: Secondary | ICD-10-CM | POA: Diagnosis not present

## 2021-04-09 DIAGNOSIS — K219 Gastro-esophageal reflux disease without esophagitis: Secondary | ICD-10-CM

## 2021-04-09 MED ORDER — AZITHROMYCIN 250 MG PO TABS: ORAL_TABLET | ORAL | 0 refills | Status: AC

## 2021-04-09 NOTE — Assessment & Plan Note (Signed)
Generally asymptomatic.  She will continue to monitor.

## 2021-04-09 NOTE — Progress Notes (Signed)
Tommi Rumps, MD Phone: [phone]  Madison Craig is a 75 y.o. female who presents today for f/u.  GERD:   Reflux symptoms: no   Abd pain: no   Blood in stool: no  Dysphagia: no   EGD: 04/24/17 with esophagitis and a hiatal hernia  Medication: omeprazole  Asthma: Patient notes no albuterol use.  No issues with cough, wheezing, or shortness of breath.  Osteoarthritis: She takes diclofenac once daily.  It is beneficial.  She notes no abdominal pain as long she takes omeprazole.  Respiratory illness: Patient reports onset of symptoms about 2 weeks ago.  She got her COVID booster and had a slight temperature with this.  Subsequently developed cough with postnasal drip and some mild nasal congestion.  No sinus congestion.  No fevers.  She has had rhinorrhea and postnasal drip.  No sneezing.  She uses Flonase.  She wonders if Zyrtec would be okay to start.  Social History   Tobacco Use  Smoking Status Former   Packs/day: 2.00   Years: 16.00   Pack years: 32.00   Types: Cigarettes   Quit date: 09/28/1981   Years since quitting: 39.5  Smokeless Tobacco Never    Current Outpatient Medications on File Prior to Visit  Medication Sig Dispense Refill   albuterol (VENTOLIN HFA) 108 (90 Base) MCG/ACT inhaler Inhale 2 puffs into the lungs every 4 (four) hours as needed for wheezing or shortness of breath. 18 g 0   diclofenac (VOLTAREN) 75 MG EC tablet Take 1 tablet by mouth twice daily as needed 60 tablet 0   fluticasone (FLONASE) 50 MCG/ACT nasal spray Place 2 sprays into both nostrils daily. 16 g 6   omeprazole (PRILOSEC) 40 MG capsule Take 1 capsule by mouth once daily 90 capsule 0   Vitamin D, Ergocalciferol, (DRISDOL) 1.25 MG (50000 UNIT) CAPS capsule Take 1 capsule (50,000 Units total) by mouth every 7 (seven) days. 8 capsule 0   No current facility-administered medications on file prior to visit.     ROS see history of present illness  Objective  Physical  Exam Vitals:   04/09/21 0841  BP: 120/78  Pulse: 88  Temp: 98.9 F (37.2 C)  SpO2: 99%    BP Readings from Last 3 Encounters:  04/09/21 120/78  03/06/21 120/80  02/13/21 107/65   Wt Readings from Last 3 Encounters:  04/09/21 184 lb 6.4 oz (83.6 kg)  03/06/21 185 lb (83.9 kg)  02/13/21 184 lb 14.4 oz (83.9 kg)    Physical Exam Constitutional:      General: She is not in acute distress.    Appearance: She is not diaphoretic.  Cardiovascular:     Rate and Rhythm: Normal rate and regular rhythm.     Heart sounds: Normal heart sounds.  Pulmonary:     Effort: Pulmonary effort is normal.     Comments: Slight coarseness in her lungs left greater than right, significantly improves with coughing though did persist on the left after coughing Skin:    General: Skin is warm and dry.  Neurological:     Mental Status: She is alert.     Assessment/Plan: Please see individual problem list.  Problem List Items Addressed This Visit     GERD (gastroesophageal reflux disease)    She will continue omeprazole 40 mg once daily.      OAD (obstructive airway disease) (HCC)    Generally asymptomatic.  She will continue to monitor.      Osteoarthritis  She can continue diclofenac 75 mg twice daily as needed.      Respiratory illness    Suspect likely allergic component though patient also possibly has a more bronchitic or viral component with the coarseness in her lungs.  There was no appreciated wheezing.  She will continue her Flonase and she will start on Zyrtec to help with the upper respiratory symptoms.  We will start her on azithromycin to cover for any bronchitis or possible pneumonia.  If she is not improving she will contact us Friday to consider an alternative antibiotic.       Return in about 4 months (around 08/07/2021) for Osteoporosis.  This visit occurred during the SARS-CoV-2 public health emergency.  Safety protocols were in place, including screening questions  prior to the visit, additional usage of staff PPE, and extensive cleaning of exam room while observing appropriate contact time as indicated for disinfecting solutions.    Tommi Rumps, MD [place]

## 2021-04-09 NOTE — Assessment & Plan Note (Signed)
She will continue omeprazole 40 mg once daily.

## 2021-04-09 NOTE — Patient Instructions (Signed)
Nice to see you. Please continue the Flonase.  You can also start on Zyrtec over-the-counter.  I sent in azithromycin for you to start on to see if that would help with your symptoms as well.  If you are not improving by Friday please let us know.

## 2021-04-09 NOTE — Assessment & Plan Note (Signed)
She can continue diclofenac 75 mg twice daily as needed.

## 2021-04-09 NOTE — Assessment & Plan Note (Signed)
Suspect likely allergic component though patient also possibly has a more bronchitic or viral component with the coarseness in her lungs.  There was no appreciated wheezing.  She will continue her Flonase and she will start on Zyrtec to help with the upper respiratory symptoms.  We will start her on azithromycin to cover for any bronchitis or possible pneumonia.  If she is not improving she will contact us Friday to consider an alternative antibiotic.

## 2021-04-11 ENCOUNTER — Other Ambulatory Visit: Payer: Self-pay | Admitting: *Deleted

## 2021-04-11 ENCOUNTER — Telehealth: Payer: Self-pay | Admitting: *Deleted

## 2021-04-11 MED ORDER — LORAZEPAM 1 MG PO TABS: ORAL_TABLET | ORAL | 0 refills | Status: DC

## 2021-04-11 NOTE — Telephone Encounter (Signed)
-----   Message from Leone Haven, MD sent at 03/06/2021  9:55 AM EDT ----- Can we work on getting this patient approved for prolia?

## 2021-04-11 NOTE — Telephone Encounter (Signed)
PA started on Covermymeds

## 2021-04-12 NOTE — Telephone Encounter (Addendum)
PA approved for Prolia okay to start.  Approvedtoday PA Case: [id number], Status: Approved, Coverage Starts on: 04/11/2021 12:00:00 AM, Coverage Ends on: 05/25/2022 12:00:00 AM. Questions? Contact [phone].

## 2021-04-15 NOTE — Telephone Encounter (Signed)
It is ok to schedule her for this. Thanks.

## 2021-04-17 ENCOUNTER — Ambulatory Visit: Payer: Medicare HMO | Admitting: Vascular Surgery

## 2021-04-17 ENCOUNTER — Encounter: Payer: Self-pay | Admitting: Vascular Surgery

## 2021-04-17 ENCOUNTER — Other Ambulatory Visit: Payer: Self-pay

## 2021-04-17 VITALS — BP 122/76 | HR 74 | Temp 97.3°F | Resp 18 | Ht 61.0 in | Wt 184.0 lb

## 2021-04-17 DIAGNOSIS — I872 Venous insufficiency (chronic) (peripheral): Secondary | ICD-10-CM | POA: Diagnosis not present

## 2021-04-17 DIAGNOSIS — I83811 Varicose veins of right lower extremities with pain: Secondary | ICD-10-CM

## 2021-04-17 DIAGNOSIS — I8393 Asymptomatic varicose veins of bilateral lower extremities: Secondary | ICD-10-CM

## 2021-04-17 HISTORY — PX: ENDOVENOUS ABLATION SAPHENOUS VEIN W/ LASER: SUR449

## 2021-04-17 NOTE — Progress Notes (Signed)
Patient name: Madison Craig MRN: [id number] DOB: 12/14/1945 Sex: female  REASON FOR VISIT: For laser ablation of the right great saphenous vein with greater than 20 stabs  HPI: Madison Craig is a 75 y.o. female who I last saw on 02/13/2021 with CEAP C4c venous disease.  I last saw her on 02/13/2021.  She had significant chronic venous insufficiency and had failed conservative treatment.  I felt that  she would be a good candidate for laser ablation of the right great saphenous vein in the proximal thigh above where this gives off a large tributary feeding her varicose veins in the posterior leg and medial right leg.  I would cannulate the vein in the proximal to mid thigh above the takeoff of the large tributary.  Current Outpatient Medications  Medication Sig Dispense Refill   diclofenac (VOLTAREN) 75 MG EC tablet Take 1 tablet by mouth twice daily as needed 60 tablet 0   fluticasone (FLONASE) 50 MCG/ACT nasal spray Place 2 sprays into both nostrils daily. 16 g 6   LORazepam (ATIVAN) 1 MG tablet Take 1 tablet 30 minutes prior to leaving house on day of office surgery.  Bring second tablet with you to office on day of office surgery. 2 tablet 0   omeprazole (PRILOSEC) 40 MG capsule Take 1 capsule by mouth once daily 90 capsule 0   albuterol (VENTOLIN HFA) 108 (90 Base) MCG/ACT inhaler Inhale 2 puffs into the lungs every 4 (four) hours as needed for wheezing or shortness of breath. (Patient not taking: Reported on 04/17/2021) 18 g 0   Vitamin D, Ergocalciferol, (DRISDOL) 1.25 MG (50000 UNIT) CAPS capsule Take 1 capsule (50,000 Units total) by mouth every 7 (seven) days. (Patient not taking: Reported on 04/17/2021) 8 capsule 0   No current facility-administered medications for this visit.    PHYSICAL EXAM: Vitals:   04/17/21 0837  BP: 122/76  Pulse: 74  Resp: 18  Temp: (!) 97.3 F (36.3 C)  TempSrc: Temporal  SpO2: 98%  Weight: 184 lb (83.5 kg)  Height: 5\' 1"  (1.549 m)     PROCEDURE: Laser ablation right great saphenous vein with greater than 20 stabs  TECHNIQUE: The patient was taken to the exam room and the dilated varicose veins were marked with the patient standing.  The patient was then placed supine.  I looked at the right great saphenous vein myself with the SonoSite and I felt that I could cannulate this in the distal thigh and then tracked the more superficial vein up to where it enters the fascia.  This way I would incorporate more tributaries which were contributing to her venous hypertension.  Right leg was prepped and draped in usual sterile fashion.  Under ultrasound guidance, after the skin was anesthetized, I cannulated the great saphenous vein in the distal thigh with a micropuncture needle and a micropuncture sheath was introduced over the wire.  I then advanced the J-wire to just below the saphenofemoral junction.  Tumescent anesthesia was administered circumferentially around the vein.  The laser fiber was positioned 2-1/2 cm distal to the saphenofemoral junction.  Laser ablation was performed of the right great saphenous vein from 2 and half centimeters distal to the saphenofemoral junction to the distal thigh.  50 J/cm was used at 52 W.  Attention was then turned to stab phlebectomies.  Approximately 25 small stab incisions were made with an 11 blade.  The vein was grasped with a hook brought above the skin grasped with a  hemostat and then bluntly excised.  Pressure was held for hemostasis.  Steri-Strips were applied.  A pressure dressing was applied.  The patient tolerated procedure well.  She will return in 1 week for a follow-up duplex.  [name] [facility] [phone]

## 2021-04-17 NOTE — Progress Notes (Signed)
     Laser Ablation Procedure    Date: 04/17/2021   Madison Craig DOB:Nov 03, 1945  Consent signed: Yes      Surgeon: Gae Gallop MD   Procedure: Laser Ablation: right Greater Saphenous Vein  BP 122/76 (BP Location: Left Arm, Patient Position: Sitting, Cuff Size: Normal)   Pulse 74   Temp (!) 97.3 F (36.3 C) (Temporal)   Resp 18   Ht 5\' 1"  (1.549 m)   Wt 184 lb (83.5 kg)   SpO2 98%   BMI 34.77 kg/m   Tumescent Anesthesia: 500 cc 0.9% NaCl with 50 cc Lidocaine HCL 1%  and 15 cc 8.4% NaHCO3  Local Anesthesia: 5 cc Lidocaine HCL and NaHCO3 (ratio 2:1)  7 watts continuous mode     Total energy: 1358 Joules    Total time: 194 seconds  Treatment Length  32 cm   Laser Fiber Ref. #  [id number]      Lot #  [id number]   Stab Phlebectomy: > 20  Sites: Thigh and Calf  Patient tolerated procedure well  Notes: Patient wore face mask.  All staff members wore facial masks and facial shields/goggles.  Ms. Enoch took Ativan 1 mg at 7:45 AM and 8:20 AM on 04-17-2021.  Description of Procedure:  After marking the course of the secondary varicosities, the patient was placed on the operating table in the supine position, and the right leg was prepped and draped in sterile fashion.   Local anesthetic was administered and under ultrasound guidance the saphenous vein was accessed with a micro needle and guide wire; then the mirco puncture sheath was placed.  A guide wire was inserted saphenofemoral junction , followed by a 5 french sheath.  The position of the sheath and then the laser fiber below the junction was confirmed using the ultrasound.  Tumescent anesthesia was administered along the course of the saphenous vein using ultrasound guidance. The patient was placed in Trendelenburg position and protective laser glasses were placed on patient and staff, and the laser was fired at 7 watts continuous mode for a total of 1358 joules.   For stab phlebectomies, local anesthetic was  administered at the previously marked varicosities, and tumescent anesthesia was administered around the vessels.  Greater than 20 stab wounds were made using the tip of an 11 blade. And using the vein hook, the phlebectomies were performed using a hemostat to avulse the varicosities.  Adequate hemostasis was achieved.     Steri strips were applied to the stab wounds and ABD pads and thigh high compression stockings were applied.  Ace wrap bandages were applied over the phlebectomy sites and at the top of the saphenofemoral junction. Blood loss was less than 15 cc.  Discharge instructions reviewed with patient and hardcopy of discharge instructions given to patient to take home. The patient ambulated out of the operating room having tolerated the procedure well.

## 2021-04-23 NOTE — Telephone Encounter (Signed)
Called to schedule patient she refused stating she has to much going on right now, she would discuss after her surgery.

## 2021-04-24 ENCOUNTER — Encounter: Payer: Self-pay | Admitting: Vascular Surgery

## 2021-04-24 ENCOUNTER — Ambulatory Visit (INDEPENDENT_AMBULATORY_CARE_PROVIDER_SITE_OTHER): Payer: Medicare HMO | Admitting: Vascular Surgery

## 2021-04-24 ENCOUNTER — Ambulatory Visit (HOSPITAL_COMMUNITY)
Admission: RE | Admit: 2021-04-24 | Discharge: 2021-04-24 | Disposition: A | Discharge status: Home / Self Care | Payer: Medicare HMO | Source: Ambulatory Visit | Attending: Vascular Surgery | Admitting: Vascular Surgery

## 2021-04-24 ENCOUNTER — Other Ambulatory Visit: Payer: Self-pay

## 2021-04-24 VITALS — BP 118/73 | HR 69 | Temp 98.1°F | Resp 16 | Ht 61.0 in | Wt 184.0 lb

## 2021-04-24 DIAGNOSIS — I83811 Varicose veins of right lower extremities with pain: Secondary | ICD-10-CM | POA: Insufficient documentation

## 2021-04-24 DIAGNOSIS — I872 Venous insufficiency (chronic) (peripheral): Secondary | ICD-10-CM

## 2021-04-24 NOTE — Progress Notes (Signed)
   Patient name: Madison Craig MRN: [id number] DOB: January 18, 1946 Sex: female  REASON FOR VISIT: Follow-up after laser ablation of the right great saphenous vein with greater than 20 stabs  HPI: Madison Craig is a 75 y.o. female  CEAP C4c venous disease (corona phlebectatica).  She had failed conservative treatment and was felt to be a good candidate for laser ablation of the right great saphenous vein and greater than 20 stabs.  On 04/17/2021, she underwent laser ablation of the right great saphenous vein from 2-1/2 cm distal to the saphenofemoral junction to the distal thigh.  She had greater than 20 stab phlebectomies.  She has no specific complaints.  She has been wearing her thigh-high compression stocking.  She had some mild bruising but no significant pain  Current Outpatient Medications  Medication Sig Dispense Refill   diclofenac (VOLTAREN) 75 MG EC tablet Take 1 tablet by mouth twice daily as needed 60 tablet 0   fluticasone (FLONASE) 50 MCG/ACT nasal spray Place 2 sprays into both nostrils daily. 16 g 6   LORazepam (ATIVAN) 1 MG tablet Take 1 tablet 30 minutes prior to leaving house on day of office surgery.  Bring second tablet with you to office on day of office surgery. 2 tablet 0   omeprazole (PRILOSEC) 40 MG capsule Take 1 capsule by mouth once daily 90 capsule 0   albuterol (VENTOLIN HFA) 108 (90 Base) MCG/ACT inhaler Inhale 2 puffs into the lungs every 4 (four) hours as needed for wheezing or shortness of breath. (Patient not taking: Reported on 04/17/2021) 18 g 0   Vitamin D, Ergocalciferol, (DRISDOL) 1.25 MG (50000 UNIT) CAPS capsule Take 1 capsule (50,000 Units total) by mouth every 7 (seven) days. (Patient not taking: Reported on 04/17/2021) 8 capsule 0   No current facility-administered medications for this visit.   REVIEW OF SYSTEMS: Valu.Nieves ] denotes positive finding; [  ] denotes negative finding  CARDIOVASCULAR:  [ ]  chest pain   [ ]  dyspnea on exertion  [ ]  leg  swelling  CONSTITUTIONAL:  [ ]  fever   [ ]  chills  PHYSICAL EXAM: Vitals:   04/24/21 1041  BP: 118/73  Pulse: 69  Resp: 16  Temp: 98.1 F (36.7 C)  TempSrc: Temporal  SpO2: 96%  Weight: 184 lb (83.5 kg)  Height: 5\' 1"  (1.549 m)   GENERAL: The patient is a well-nourished female, in no acute distress. The vital signs are documented above. CARDIOVASCULAR: There is a regular rate and rhythm. PULMONARY: There is good air exchange bilaterally without wheezing or rales. VASCULAR: She has some mild bruising in the medial thigh and calf.  She has no significant leg swelling.  DATA:  VENOUS DUPLEX: I have independently interpreted her venous duplex scan today.  There is no evidence of DVT.  The right great saphenous vein is successfully closed from 7 mm distal to the saphenofemoral junction to the proximal calf.  MEDICAL ISSUES:  S/P LASER ABLATION RSV WITH GREATER THAN 20 STABS: Patient is doing well after laser ablation of the right great saphenous vein and greater than 20 stabs.  She has 1 more week with her thigh-high compression stocking.  I have instructed her that she does not need to wear this at night.  She will call if her symptoms in the left leg progressed however currently the symptoms have improved.  [name] [facility] [place] [phone]

## 2021-04-24 NOTE — Telephone Encounter (Signed)
Noted  

## 2021-05-21 ENCOUNTER — Other Ambulatory Visit: Payer: Self-pay | Admitting: Family Medicine

## 2021-06-04 ENCOUNTER — Telehealth: Payer: Self-pay | Admitting: Family Medicine

## 2021-06-04 NOTE — Telephone Encounter (Signed)
Pt has a knot on her left arm. Pt said it was sore and tender and when she lay her arm down it hurts. Pt said the knot is below her elbow and it has been there for awhile. I did not have any appts on Friday for the pt as she requested that day for me to look. Pt said she had to get back to work so I told pt that I will send the note to the assistant.

## 2021-06-04 NOTE — Telephone Encounter (Signed)
Lvm for patient to call back for and scheduke a samd day visit with a provider in office about her arm.  Raylene Carmickle,cma

## 2021-06-05 NOTE — Telephone Encounter (Signed)
LVM for patient to call back to schedule a same day visit about her arm pain.  Jearld Hemp,cma

## 2021-06-05 NOTE — Telephone Encounter (Signed)
Pt schedule appt about arm pain with tracy mclean on 1/13 at 730

## 2021-06-07 ENCOUNTER — Ambulatory Visit (INDEPENDENT_AMBULATORY_CARE_PROVIDER_SITE_OTHER): Payer: Medicare HMO

## 2021-06-07 ENCOUNTER — Other Ambulatory Visit: Payer: Self-pay

## 2021-06-07 ENCOUNTER — Ambulatory Visit (INDEPENDENT_AMBULATORY_CARE_PROVIDER_SITE_OTHER): Payer: Medicare HMO | Admitting: Internal Medicine

## 2021-06-07 ENCOUNTER — Encounter: Payer: Self-pay | Admitting: Internal Medicine

## 2021-06-07 VITALS — BP 122/70 | HR 75 | Temp 97.5°F | Ht 61.0 in | Wt 186.4 lb

## 2021-06-07 DIAGNOSIS — M1812 Unilateral primary osteoarthritis of first carpometacarpal joint, left hand: Secondary | ICD-10-CM | POA: Diagnosis not present

## 2021-06-07 DIAGNOSIS — M810 Age-related osteoporosis without current pathological fracture: Secondary | ICD-10-CM | POA: Diagnosis not present

## 2021-06-07 DIAGNOSIS — R2232 Localized swelling, mass and lump, left upper limb: Secondary | ICD-10-CM | POA: Diagnosis not present

## 2021-06-07 DIAGNOSIS — T148XXA Other injury of unspecified body region, initial encounter: Secondary | ICD-10-CM

## 2021-06-07 NOTE — Progress Notes (Signed)
Chief Complaint  Patient presents with   Arm Pain    Knot under the elbow of the left arm. Pain comes and goes. No known injuries. First noticed the know a few months ago. Patient also has a lot of bruising in the same area that she can not explain.    F/u  1. 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  HENT:  Negative for hearing loss.   Eyes:  Negative for blurred vision.  Respiratory:  Negative for shortness of breath.   Cardiovascular:  Negative for chest pain.  Gastrointestinal:  Negative for abdominal pain and blood in stool.  Genitourinary:  Negative for dysuria.  Musculoskeletal:  Negative for falls and joint pain.  Skin:  Negative for rash.  Neurological:  Negative for headaches.  Psychiatric/Behavioral:  Negative for depression.   Past Medical History:  Diagnosis Date   Anemia    Arthritis    Basal cell carcinoma    Benign neoplasm of ascending colon    Dyspnea 09/28/2013   Pulmonary function testing done on 02/09/2014 showed minimal airway obstruction with a lack of response to bronchodilators; her FEV1 was normal, the FEV1/FVC ratio and the FEF 25-75% were reduced; the airway resistance was normal; lung volumes were within normal limits; the diffusing capacity was high for the measured volumes.     GERD (gastroesophageal reflux disease)    Heart murmur    Heme + stool    Macular degeneration 01/11/2014   Followed at [facility] in [place], [place].    Multiple thyroid nodules    Thyroid ultrasound on 01/25/2014 showed a multinodular goiter, with a 1.5 cm nodule in the inferior left thyroid lobe that met the criteria for ultrasound-guided biopsy.  On 02/06/2014 an ultrasound guided  needle aspirate biopsy was performed of the left inferior thyroid nodule; cytopathology findings were consistent with non-neoplastic goiter.      Osteoporosis 09/18/2014   DXA 08/16/2014 showed osteopenia of right femur neck (T-score -2.3) and osteoporosis of left forearm radius (T-score -2.9).  Lumbar spine was not utilized due to advanced degenerative changes.    Reflux esophagitis    SLEEP APNEA 03/22/2009   Had sleep study in [place] about 10 years, has BiPAP, cannot sleep with it on.     Past Surgical History:  Procedure Laterality Date   BREAST SURGERY  1988   breast biopsy   CARDIAC CATHETERIZATION  2006   No Stents/[facility]   COLONOSCOPY WITH PROPOFOL N/A 04/24/2017   Procedure: COLONOSCOPY WITH PROPOFOL;  Surgeon: Virgel Manifold, MD;  Location: [facility] ENDOSCOPY;  Service: Endoscopy;  Laterality: N/A;   ENDOVENOUS ABLATION SAPHENOUS VEIN W/ LASER Right 04/17/2021   endovenous laser ablation right greater saphenous vein and stab phlebectomy > 20 incisions right leg by Gae Gallop MD   ESOPHAGOGASTRODUODENOSCOPY (EGD) WITH PROPOFOL N/A 04/24/2017   Procedure: ESOPHAGOGASTRODUODENOSCOPY (EGD) WITH PROPOFOL;  Surgeon: Virgel Manifold, MD;  Location: [facility] ENDOSCOPY;  Service: Endoscopy;  Laterality: N/A;   eye     cateract surgery   Family History  Problem Relation Age of Onset   Stroke Mother    Hypertension Mother  Dementia Mother    Heart attack Mother    Atrial fibrillation Mother    Heart disease Father 21   Sudden death Father    Hypertension Father    Hyperlipidemia Father    Heart attack Father    Stroke Sister 66   Alcohol abuse Sister    Valvular heart disease Sister    Atrial fibrillation Sister    Aneurysm Sister    Arrhythmia Brother    Atrial fibrillation Brother    Diabetes Son 34   Hodgkin's lymphoma Son    Colon cancer Neg Hx    Breast cancer Neg Hx    Social History   Socioeconomic History   Marital status: Widowed    Spouse name: Not  on file   Number of children: 3   Years of education: Not on file   Highest education level: Not on file  Occupational History   Not on file  Tobacco Use   Smoking status: Former    Packs/day: 2.00    Years: 16.00    Pack years: 32.00    Types: Cigarettes    Quit date: 09/28/1981    Years since quitting: 39.7   Smokeless tobacco: Never  Vaping Use   Vaping Use: Never used  Substance and Sexual Activity   Alcohol use: No   Drug use: No   Sexual activity: Never  Other Topics Concern   Not on file  Social History Narrative   Not on file   Social Determinants of Health   Financial Resource Strain: Not on file  Food Insecurity: No Food Insecurity   Worried About Running Out of Food in the Last Year: Never true   [place] in the Last Year: Never true  Transportation Needs: No Transportation Needs   Lack of Transportation (Medical): No   Lack of Transportation (Non-Medical): No  Physical Activity: Sufficiently Active   Days of Exercise per Week: 5 days   Minutes of Exercise per Session: 30 min  Stress: No Stress Concern Present   Feeling of Stress : Not at all  Social Connections: Unknown   Frequency of Communication with Friends and Family: More than three times a week   Frequency of Social Gatherings with Friends and Family: More than three times a week   Attends Religious Services: Not on Electrical engineer or Organizations: Not on file   Attends Archivist Meetings: Not on file   Marital Status: Not on file  Intimate Partner Violence: Not At Risk   Fear of Current or Ex-Partner: No   Emotionally Abused: No   Physically Abused: No   Sexually Abused: No   Current Meds  Medication Sig   Acetaminophen (TYLENOL PO) Take by mouth as needed.   diclofenac (VOLTAREN) 75 MG EC tablet Take 1 tablet by mouth twice daily as needed   fluticasone (FLONASE) 50 MCG/ACT nasal spray Place 2 sprays into both nostrils daily.   omeprazole (PRILOSEC) 40 MG  capsule Take 1 capsule by mouth once daily   Vitamin D, Ergocalciferol, (DRISDOL) 1.25 MG (50000 UNIT) CAPS capsule Take 1 capsule (50,000 Units total) by mouth every 7 (seven) days.   No Known Allergies No results found for this or any previous visit (from the past 2160 hour(s)). Objective  Body mass index is 35.22 kg/m. Wt Readings from Last 3 Encounters:  06/07/21 186 lb 6.4 oz (84.6 kg)  04/24/21 184 lb (83.5 kg)  04/17/21 184 lb (83.5 kg)  Temp Readings from Last 3 Encounters:  06/07/21 (!) 97.5 F (36.4 C) (Temporal)  04/24/21 98.1 F (36.7 C) (Temporal)  04/17/21 (!) 97.3 F (36.3 C) (Temporal)   BP Readings from Last 3 Encounters:  06/07/21 122/70  04/24/21 118/73  04/17/21 122/76   Pulse Readings from Last 3 Encounters:  06/07/21 75  04/24/21 69  04/17/21 74    Physical Exam Vitals and nursing note reviewed.  Constitutional:      Appearance: Normal appearance. She is well-developed and well-groomed.  HENT:     Head: Normocephalic and atraumatic.  Eyes:     Conjunctiva/sclera: Conjunctivae normal.     Pupils: Pupils are equal, round, and reactive to light.  Cardiovascular:     Rate and Rhythm: Normal rate and regular rhythm.     Heart sounds: Murmur heard.  Pulmonary:     Effort: Pulmonary effort is normal.     Breath sounds: Normal breath sounds.  Abdominal:     General: Abdomen is flat. Bowel sounds are normal.     Tenderness: There is no abdominal tenderness.  Musculoskeletal:        General: No tenderness.  Skin:    General: Skin is warm and dry.  Neurological:     General: No focal deficit present.     Mental Status: She is alert and oriented to person, place, and time. Mental status is at baseline.     Cranial Nerves: Cranial nerves 2-12 are intact.     Gait: Gait is intact.  Psychiatric:        Attention and Perception: Attention and perception normal.        Mood and Affect: Mood and affect normal.        Speech: Speech normal.         Behavior: Behavior normal. Behavior is cooperative.        Thought Content: Thought content normal.        Cognition and Memory: Cognition and memory normal.        Judgment: Judgment normal.    Assessment  Plan  Arm mass, left - Plan: DG Forearm Left ?lipoma or soft tissue growth consider [place] pt wants to wait on xray  [place] change to T, Thus, Friday am appt   Bruising left humerus and shoulder no trauma  Consider labs at f/u with PCP 07/2021 Could also be from nsaid use   Osteoporosis Pt wants to start prolia needs to be preapproved again and have labs for calcium disc with Juliann Pulse today will f/u with PCP    Provider: Dr. Olivia Mackie McLean-Scocuzza-Internal Medicine

## 2021-06-07 NOTE — Patient Instructions (Signed)
Will do Xray left arm and ultrasound if negative  Lipoma A lipoma is a noncancerous (benign) tumor that is made up of fat cells. This is a very common type of soft-tissue growth. Lipomas are usually found under the skin (subcutaneous). They may occur in any tissue of the body that contains fat. Common areas for lipomas to appear include the back, arms, shoulders, buttocks, and thighs. Lipomas grow slowly, and they are usually painless. Most lipomas do not cause problems and do not require treatment. What are the causes? The cause of this condition is not known. What increases the risk? You are more likely to develop this condition if: You are 78-71 years old. You have a family history of lipomas. What are the signs or symptoms? A lipoma usually appears as a small, round bump under the skin. In most cases, the lump will: Feel soft or rubbery. Not cause pain or other symptoms. However, if a lipoma is located in an area where it pushes on nerves, it can become painful or cause other symptoms. How is this diagnosed? A lipoma can usually be diagnosed with a physical exam. You may also have tests to confirm the diagnosis and to rule out other conditions. Tests may include: Imaging tests, such as a CT scan or an MRI. Removal of a tissue sample to be looked at under a microscope (biopsy). How is this treated? Treatment for this condition depends on the size of the lipoma and whether it is causing any symptoms. For small lipomas that are not causing problems, no treatment is needed. If a lipoma is bigger or it causes problems, surgery may be done to remove the lipoma. Lipomas can also be removed to improve appearance. Most often, the procedure is done after applying a medicine that numbs the area (local anesthetic). Liposuction may be done to reduce the size of the lipoma before it is removed through surgery, or it may be done to remove the lipoma. Lipomas are removed with this method in order to limit  incision size and scarring. A liposuction tube is inserted through a small incision into the lipoma, and the contents of the lipoma are removed through the tube with suction. Follow these instructions at home: Watch your lipoma for any changes. Keep all follow-up visits as told by your health care provider. This is important. Contact a health care provider if: Your lipoma becomes larger or hard. Your lipoma becomes painful, red, or increasingly swollen. These could be signs of infection or a more serious condition. Get help right away if: You develop tingling or numbness in an area near the lipoma. This could indicate that the lipoma is causing nerve damage. Summary A lipoma is a noncancerous tumor that is made up of fat cells. Most lipomas do not cause problems and do not require treatment. If a lipoma is bigger or it causes problems, surgery may be done to remove the lipoma. Contact a health care provider if your lipoma becomes larger or hard, or if it becomes painful, red, or increasingly swollen. Pain, redness, and swelling could be signs of infection or a more serious condition. This information is not intended to replace advice given to you by your health care provider. Make sure you discuss any questions you have with your health care provider. Document Revised: 12/27/2018 Document Reviewed: 12/27/2018 Elsevier Patient Education  [place].

## 2021-06-12 ENCOUNTER — Telehealth: Payer: Self-pay | Admitting: *Deleted

## 2021-06-12 NOTE — Telephone Encounter (Signed)
Mittie Bodo Key: Bryson Corona - PA Case ID: [id number]

## 2021-06-12 NOTE — Telephone Encounter (Signed)
-----   Message from Delorise Jackson, MD sent at 06/07/2021  8:05 AM EST ----- Pt wants to restart prolia again needs to be reapproved

## 2021-06-17 ENCOUNTER — Telehealth: Payer: Self-pay | Admitting: Family Medicine

## 2021-06-17 NOTE — Addendum Note (Signed)
Addended by: Orland Mustard on: 06/17/2021 06:06 AM   Modules accepted: Orders

## 2021-06-17 NOTE — Telephone Encounter (Signed)
Lft pt vm to call ofc to sch [place]. Thanks 

## 2021-06-19 ENCOUNTER — Other Ambulatory Visit: Payer: Self-pay | Admitting: Family Medicine

## 2021-06-27 ENCOUNTER — Ambulatory Visit
Admission: RE | Admit: 2021-06-27 | Discharge: 2021-06-27 | Disposition: A | Discharge status: Home / Self Care | Payer: Medicare HMO | Source: Ambulatory Visit | Attending: Internal Medicine | Admitting: Internal Medicine

## 2021-06-27 ENCOUNTER — Other Ambulatory Visit: Payer: Self-pay

## 2021-06-27 DIAGNOSIS — R2232 Localized swelling, mass and lump, left upper limb: Secondary | ICD-10-CM | POA: Insufficient documentation

## 2021-07-05 ENCOUNTER — Telehealth: Payer: Self-pay | Admitting: Family Medicine

## 2021-07-05 NOTE — Telephone Encounter (Signed)
Pt returning call. Pt requesting callback with an hour. Pt stated that she will be going to work

## 2021-07-18 ENCOUNTER — Other Ambulatory Visit: Payer: Self-pay | Admitting: Family Medicine

## 2021-07-29 NOTE — Telephone Encounter (Addendum)
Patient will receive first Prolia injection 08/13/21 she has decided to go with Prolia for osteoporosis. ? ?PA Case: [id number], Status: Approved, Coverage Starts on: 04/11/2021 12:00:00 AM, Coverage Ends on: 05/25/2022 12:00:00 AM. Questions? Contact [phone]. ?

## 2021-07-29 NOTE — Telephone Encounter (Signed)
Noted  

## 2021-08-13 ENCOUNTER — Other Ambulatory Visit: Payer: Self-pay

## 2021-08-13 ENCOUNTER — Encounter: Payer: Self-pay | Admitting: Family Medicine

## 2021-08-13 ENCOUNTER — Ambulatory Visit (INDEPENDENT_AMBULATORY_CARE_PROVIDER_SITE_OTHER): Payer: Medicare HMO | Admitting: Family Medicine

## 2021-08-13 VITALS — BP 125/70 | HR 72 | Temp 98.7°F | Wt 181.4 lb

## 2021-08-13 DIAGNOSIS — R42 Dizziness and giddiness: Secondary | ICD-10-CM | POA: Diagnosis not present

## 2021-08-13 DIAGNOSIS — M810 Age-related osteoporosis without current pathological fracture: Secondary | ICD-10-CM

## 2021-08-13 DIAGNOSIS — J309 Allergic rhinitis, unspecified: Secondary | ICD-10-CM

## 2021-08-13 DIAGNOSIS — R011 Cardiac murmur, unspecified: Secondary | ICD-10-CM | POA: Diagnosis not present

## 2021-08-13 DIAGNOSIS — G4730 Sleep apnea, unspecified: Secondary | ICD-10-CM | POA: Diagnosis not present

## 2021-08-13 DIAGNOSIS — M1990 Unspecified osteoarthritis, unspecified site: Secondary | ICD-10-CM

## 2021-08-13 DIAGNOSIS — E7800 Pure hypercholesterolemia, unspecified: Secondary | ICD-10-CM | POA: Diagnosis not present

## 2021-08-13 DIAGNOSIS — D500 Iron deficiency anemia secondary to blood loss (chronic): Secondary | ICD-10-CM

## 2021-08-13 LAB — COMPREHENSIVE METABOLIC PANEL
ALT: 18 U/L (ref 0–35)
AST: 20 U/L (ref 0–37)
Albumin: 4.4 g/dL (ref 3.5–5.2)
Alkaline Phosphatase: 74 U/L (ref 39–117)
BUN: 19 mg/dL (ref 6–23)
CO2: 28 mEq/L (ref 19–32)
Calcium: 9.5 mg/dL (ref 8.4–10.5)
Chloride: 100 mEq/L (ref 96–112)
Creatinine, Ser: 1.02 mg/dL (ref 0.40–1.20)
GFR: 54.36 mL/min — ABNORMAL LOW (ref >60.00)
Glucose, Bld: 93 mg/dL (ref 70–99)
Potassium: 4.5 mEq/L (ref 3.5–5.1)
Sodium: 136 mEq/L (ref 135–145)
Total Bilirubin: 0.7 mg/dL (ref 0.2–1.2)
Total Protein: 7.2 g/dL (ref 6.0–8.3)

## 2021-08-13 LAB — CBC
HCT: 38.5 % (ref 36.0–46.0)
Hemoglobin: 12.6 g/dL (ref 12.0–15.0)
MCHC: 32.7 g/dL (ref 30.0–36.0)
MCV: 90.5 fl (ref 78.0–100.0)
Platelets: 203 10*3/uL (ref 150.0–400.0)
RBC: 4.26 Mil/uL (ref 3.87–5.11)
RDW: 13.6 % (ref 11.5–15.5)
WBC: 4.5 10*3/uL (ref 4.0–10.5)

## 2021-08-13 LAB — LIPID PANEL
Cholesterol: 180 mg/dL (ref 0–200)
HDL: 60.2 mg/dL (ref >39.00)
LDL Cholesterol: 102 mg/dL — ABNORMAL HIGH (ref 0–99)
NonHDL: 119.96
Total CHOL/HDL Ratio: 3
Triglycerides: 92 mg/dL (ref 0.0–149.0)
VLDL: 18.4 mg/dL (ref 0.0–40.0)

## 2021-08-13 LAB — IBC + FERRITIN
Ferritin: 57.6 ng/mL (ref 10.0–291.0)
Iron: 65 ug/dL (ref 42–145)
Saturation Ratios: 18.6 % — ABNORMAL LOW (ref 20.0–50.0)
TIBC: 348.6 ug/dL (ref 250.0–450.0)
Transferrin: 249 mg/dL (ref 212.0–360.0)

## 2021-08-13 LAB — VITAMIN D 25 HYDROXY (VIT D DEFICIENCY, FRACTURES): VITD: 29.74 ng/mL — ABNORMAL LOW (ref 30.00–100.00)

## 2021-08-13 NOTE — Assessment & Plan Note (Signed)
Recheck labs today. 

## 2021-08-13 NOTE — Patient Instructions (Addendum)
Nice to see you. ?Please complete the Epley exercises for your inner ear issue. ?Please use your Flonase.  You can try adding Zyrtec as well. ?We will get lab work today and contact you with the results. ? ?How to Perform the Epley Maneuver ?The Epley maneuver is an exercise that relieves symptoms of vertigo. Vertigo is the feeling that you or your surroundings are moving when they are not. When you feel vertigo, you may feel like the room is spinning and may have trouble walking. The Epley maneuver is used for a type of vertigo caused by a calcium deposit in a part of the inner ear. The maneuver involves changing head positions to help the deposit move out of the area. ?You can do this maneuver at home whenever you have symptoms of vertigo. You can repeat it in 24 hours if your vertigo has not gone away. ?Even though the Epley maneuver may relieve your vertigo for a few weeks, it is possible that your symptoms will return. This maneuver relieves vertigo, but it does not relieve dizziness. ?What are the risks? ?If it is done correctly, the Epley maneuver is considered safe. Sometimes it can lead to dizziness or nausea that goes away after a short time. If you develop other symptoms--such as changes in vision, weakness, or numbness--stop doing the maneuver and call your health care provider. ?Supplies needed: ?A bed or table. ?A pillow. ?How to do the Epley maneuver ?  ?Sit on the edge of a bed or table with your back straight and your legs extended or hanging over the edge of the bed or table. ?Turn your head halfway toward the affected ear or side as told by your health care provider. ?Lie backward quickly with your head turned until you are lying flat on your back. Your head should dangle (head-hanging position). You may want to position a pillow under your shoulders. ?Hold this position for at least 30 seconds. If you feel dizzy or have symptoms of vertigo, continue to hold the position until the symptoms  stop. ?Turn your head to the opposite direction until your unaffected ear is facing down. Your head should continue to dangle. ?Hold this position for at least 30 seconds. If you feel dizzy or have symptoms of vertigo, continue to hold the position until the symptoms stop. ?Turn your whole body to the same side as your head so that you are positioned on your side. Your head will now be nearly facedown and no longer needs to dangle. Hold for at least 30 seconds. If you feel dizzy or have symptoms of vertigo, continue to hold the position until the symptoms stop. ?Sit back up. ?You can repeat the maneuver in 24 hours if your vertigo does not go away. ?Follow these instructions at home: ?For 24 hours after doing the Epley maneuver: ?Keep your head in an upright position. ?When lying down to sleep or rest, keep your head raised (elevated) with two or more pillows. ?Avoid excessive neck movements. ?Activity ?Do not drive or use machinery if you feel dizzy. ?After doing the Epley maneuver, return to your normal activities as told by your health care provider. Ask your health care provider what activities are safe for you. ?General instructions ?Drink enough fluid to keep your urine pale yellow. ?Do not drink alcohol. ?Take over-the-counter and prescription medicines only as told by your health care provider. ?Keep all follow-up visits. This is important. ?Preventing vertigo symptoms ?Ask your health care provider if there is anything you  should do at home to prevent vertigo. He or she may recommend that you: ?Keep your head elevated with two or more pillows while you sleep. ?Do not sleep on the side of your affected ear. ?Get up slowly from bed. ?Avoid sudden movements during the day. ?Avoid extreme head positions or movement, such as looking up or bending over. ?Contact a health care provider if: ?Your vertigo gets worse. ?You have other symptoms, including: ?Nausea. ?Vomiting. ?Headache. ?Get help right away if  you: ?Have vision changes. ?Have a headache or neck pain that is severe or getting worse. ?Cannot stop vomiting. ?Have new numbness or weakness in any part of your body. ?These symptoms may represent a serious problem that is an emergency. Do not wait to see if the symptoms will go away. Get medical help right away. Call your local emergency services (911 in the [place] Do not drive yourself to the hospital. ?Summary ?Vertigo is the feeling that you or your surroundings are moving when they are not. ?The Epley maneuver is an exercise that relieves symptoms of vertigo. ?If the Epley maneuver is done correctly, it is considered safe. ?This information is not intended to replace advice given to you by your health care provider. Make sure you discuss any questions you have with your health care provider. ?Document Revised: 04/11/2020 Document Reviewed: 04/11/2020 ?Elsevier Patient Education ? [place]. ? ?

## 2021-08-13 NOTE — Assessment & Plan Note (Signed)
Seems stable on exam.  We will continue to monitor.  She will monitor for symptoms. ?

## 2021-08-13 NOTE — Assessment & Plan Note (Addendum)
Check vitamin D.  If it is acceptable we will try to get her approved for Prolia again.  I discussed possible side effects of Prolia including osteonecrosis of the jaw and aching.  Advised that the osteonecrosis of the jaw would seem to be a lower concern with her given that she does not have her own teeth.  She would contact us if she ever developed any jaw issues or any other side effects after starting this. ?

## 2021-08-13 NOTE — Progress Notes (Signed)
?Tommi Rumps, MD ?Phone: [phone] ? ?Madison Craig is a 76 y.o. female who presents today for follow-up. ? ?Heart murmur: No chest pain or shortness of breath.  No lightheadedness. ? ?OSA: No hypersomnia.  She does not wake up well rested.  She was unable to tolerate a BiPAP in the past.  She does not have her own teeth. ? ?Osteoarthritis: Patient notes her knees are quite bad.  She is still getting around on her own though it is a struggle at times.  She takes diclofenac once daily.  She reports having had cortisone injections in the past with minimal benefit. ? ?Dizziness: This is a chronic intermittent issue.  She reports seeing ENT previously and they placed her on a nose spray which was not beneficial.  She will get symptoms for 2 to 3 days at a time.  When she gets dizzy it only lasts for a few minutes at most.  Typically happens when she rotates her head.  She thinks her sinus issues are related.  She uses Flonase and that does seem to help with her sinus issues. ? ?Social History  ? ?Tobacco Use  ?Smoking Status Former  ? Packs/day: 2.00  ? Years: 16.00  ? Pack years: 32.00  ? Types: Cigarettes  ? Quit date: 09/28/1981  ? Years since quitting: 39.9  ?Smokeless Tobacco Never  ? ? ?Current Outpatient Medications on File Prior to Visit  ?Medication Sig Dispense Refill  ? Acetaminophen (TYLENOL PO) Take by mouth as needed.    ? albuterol (VENTOLIN HFA) 108 (90 Base) MCG/ACT inhaler Inhale 2 puffs into the lungs every 4 (four) hours as needed for wheezing or shortness of breath. 18 g 0  ? diclofenac (VOLTAREN) 75 MG EC tablet Take 1 tablet by mouth twice daily as needed 60 tablet 2  ? fluticasone (FLONASE) 50 MCG/ACT nasal spray Place 2 sprays into both nostrils daily. 16 g 6  ? omeprazole (PRILOSEC) 40 MG capsule Take 1 capsule by mouth once daily 90 capsule 0  ? Vitamin D, Ergocalciferol, (DRISDOL) 1.25 MG (50000 UNIT) CAPS capsule Take 1 capsule (50,000 Units total) by mouth every 7 (seven)  days. 8 capsule 0  ? ?No current facility-administered medications on file prior to visit.  ? ? ? ?ROS see history of present illness ? ?Objective ? ?Physical Exam ?Vitals:  ? 08/13/21 0840  ?BP: 125/70  ?Pulse: 72  ?Temp: 98.7 ?F (37.1 ?C)  ?SpO2: 98%  ? ? ?BP Readings from Last 3 Encounters:  ?08/13/21 125/70  ?06/07/21 122/70  ?04/24/21 118/73  ? ?Wt Readings from Last 3 Encounters:  ?08/13/21 181 lb 6.4 oz (82.3 kg)  ?06/07/21 186 lb 6.4 oz (84.6 kg)  ?04/24/21 184 lb (83.5 kg)  ? ? ?Physical Exam ?Constitutional:   ?   General: She is not in acute distress. ?   Appearance: She is not diaphoretic.  ?HENT:  ?   Right Ear: Tympanic membrane normal.  ?   Left Ear: Tympanic membrane normal.  ?Cardiovascular:  ?   Rate and Rhythm: Normal rate and regular rhythm.  ?   Heart sounds: Murmur (2/6 systolic murmur right upper sternal border) heard.  ?Pulmonary:  ?   Effort: Pulmonary effort is normal.  ?   Breath sounds: Normal breath sounds.  ?Musculoskeletal:  ?   Comments: Bilateral knees with medial joint line tenderness, patient does have some puffiness to her bilateral knees, no significant effusion noted, no warmth, no erythema  ?Skin: ?  General: Skin is warm and dry.  ?Neurological:  ?   Mental Status: She is alert.  ? ? ? ?Assessment/Plan: Please see individual problem list. ? ?Problem List Items Addressed This Visit   ? ? Allergic rhinitis  ?  Discussed continuing with Flonase.  Discussed she could add in Zyrtec and see if that helps. ?  ?  ? Dizziness  ?  Possible eustachian tube dysfunction related versus BPPV.  She will continue Flonase.  Discussed adding in Zyrtec.  She will complete the modified Epley maneuvers at home.  These were demonstrated to her. ?  ?  ? Hyperlipidemia  ? Relevant Orders  ? Lipid panel  ? Comp Met (CMET)  ? Iron deficiency anemia due to chronic blood loss  ?  Recheck labs today. ?  ?  ? Relevant Orders  ? IBC + Ferritin  ? CBC  ? Murmur  ?  Seems stable on exam.  We will continue to  monitor.  She will monitor for symptoms. ?  ?  ? Osteoarthritis  ?  Chronic ongoing issue.  Discussed she could continue diclofenac once daily as her kidney function seems to tolerate this.  We will check her kidney function today.  I did encourage her to see orthopedics again. ?  ?  ? Osteoporosis  ?  Check vitamin D.  If it is acceptable we will try to get her approved for Prolia again.  I discussed possible side effects of Prolia including osteonecrosis of the jaw and aching.  Advised that the osteonecrosis of the jaw would seem to be a lower concern with her given that she does not have her own teeth.  She would contact us if she ever developed any jaw issues or any other side effects after starting this. ?  ?  ? Relevant Orders  ? Vitamin D (25 hydroxy)  ? Sleep apnea - Primary  ?  Discussed getting another sleep study given how long its been since she was evaluated for this though she declines this at this time.  She does not want treatment for this currently.  If she changes her mind she will let us know. ?  ?  ? ? ?Return in about 6 months (around 02/13/2022). ? ?This visit occurred during the SARS-CoV-2 public health emergency.  Safety protocols were in place, including screening questions prior to the visit, additional usage of staff PPE, and extensive cleaning of exam room while observing appropriate contact time as indicated for disinfecting solutions.  ? ? ?Tommi Rumps, MD ?[place] ? ?

## 2021-08-13 NOTE — Assessment & Plan Note (Signed)
Discussed continuing with Flonase.  Discussed she could add in Zyrtec and see if that helps. ?

## 2021-08-13 NOTE — Assessment & Plan Note (Signed)
Discussed getting another sleep study given how long its been since she was evaluated for this though she declines this at this time.  She does not want treatment for this currently.  If she changes her mind she will let us know. ?

## 2021-08-13 NOTE — Assessment & Plan Note (Signed)
Possible eustachian tube dysfunction related versus BPPV.  She will continue Flonase.  Discussed adding in Zyrtec.  She will complete the modified Epley maneuvers at home.  These were demonstrated to her. ?

## 2021-08-13 NOTE — Assessment & Plan Note (Signed)
Chronic ongoing issue.  Discussed she could continue diclofenac once daily as her kidney function seems to tolerate this.  We will check her kidney function today.  I did encourage her to see orthopedics again. ?

## 2021-09-14 ENCOUNTER — Emergency Department (HOSPITAL_BASED_OUTPATIENT_CLINIC_OR_DEPARTMENT_OTHER): Payer: Medicare HMO

## 2021-09-14 ENCOUNTER — Emergency Department (HOSPITAL_BASED_OUTPATIENT_CLINIC_OR_DEPARTMENT_OTHER)
Admission: EM | Admit: 2021-09-14 | Discharge: 2021-09-14 | Disposition: A | Discharge status: Home / Self Care | Payer: Medicare HMO | Attending: Emergency Medicine | Admitting: Emergency Medicine

## 2021-09-14 ENCOUNTER — Other Ambulatory Visit: Payer: Self-pay

## 2021-09-14 ENCOUNTER — Encounter (HOSPITAL_BASED_OUTPATIENT_CLINIC_OR_DEPARTMENT_OTHER): Payer: Self-pay | Admitting: Emergency Medicine

## 2021-09-14 DIAGNOSIS — R7989 Other specified abnormal findings of blood chemistry: Secondary | ICD-10-CM | POA: Diagnosis not present

## 2021-09-14 DIAGNOSIS — D649 Anemia, unspecified: Secondary | ICD-10-CM | POA: Diagnosis not present

## 2021-09-14 DIAGNOSIS — R110 Nausea: Secondary | ICD-10-CM | POA: Diagnosis not present

## 2021-09-14 DIAGNOSIS — R55 Syncope and collapse: Secondary | ICD-10-CM | POA: Diagnosis not present

## 2021-09-14 DIAGNOSIS — S8991XA Unspecified injury of right lower leg, initial encounter: Secondary | ICD-10-CM | POA: Diagnosis not present

## 2021-09-14 DIAGNOSIS — R42 Dizziness and giddiness: Secondary | ICD-10-CM

## 2021-09-14 DIAGNOSIS — R011 Cardiac murmur, unspecified: Secondary | ICD-10-CM | POA: Diagnosis not present

## 2021-09-14 DIAGNOSIS — W06XXXA Fall from bed, initial encounter: Secondary | ICD-10-CM | POA: Diagnosis not present

## 2021-09-14 DIAGNOSIS — Y92003 Bedroom of unspecified non-institutional (private) residence as the place of occurrence of the external cause: Secondary | ICD-10-CM | POA: Diagnosis not present

## 2021-09-14 DIAGNOSIS — M25561 Pain in right knee: Secondary | ICD-10-CM | POA: Insufficient documentation

## 2021-09-14 LAB — COMPREHENSIVE METABOLIC PANEL
ALT: 16 U/L (ref 0–44)
AST: 17 U/L (ref 15–41)
Albumin: 4.3 g/dL (ref 3.5–5.0)
Alkaline Phosphatase: 53 U/L (ref 38–126)
Anion gap: 9 (ref 5–15)
BUN: 22 mg/dL (ref 8–23)
CO2: 25 mmol/L (ref 22–32)
Calcium: 9.4 mg/dL (ref 8.9–10.3)
Chloride: 101 mmol/L (ref 98–111)
Creatinine, Ser: 1.09 mg/dL — ABNORMAL HIGH (ref 0.44–1.00)
GFR, Estimated: 53 mL/min — ABNORMAL LOW (ref >60)
Glucose, Bld: 100 mg/dL — ABNORMAL HIGH (ref 70–99)
Potassium: 4.3 mmol/L (ref 3.5–5.1)
Sodium: 135 mmol/L (ref 135–145)
Total Bilirubin: 0.6 mg/dL (ref 0.3–1.2)
Total Protein: 7.3 g/dL (ref 6.5–8.1)

## 2021-09-14 LAB — TROPONIN I (HIGH SENSITIVITY): Troponin I (High Sensitivity): 3 ng/L (ref <18)

## 2021-09-14 LAB — CBC WITH DIFFERENTIAL/PLATELET
Abs Immature Granulocytes: 0.02 10*3/uL (ref 0.00–0.07)
Basophils Absolute: 0 10*3/uL (ref 0.0–0.1)
Basophils Relative: 0 %
Eosinophils Absolute: 0.1 10*3/uL (ref 0.0–0.5)
Eosinophils Relative: 2 %
HCT: 36.2 % (ref 36.0–46.0)
Hemoglobin: 11.8 g/dL — ABNORMAL LOW (ref 12.0–15.0)
Immature Granulocytes: 0 %
Lymphocytes Relative: 22 %
Lymphs Abs: 1.4 10*3/uL (ref 0.7–4.0)
MCH: 29.8 pg (ref 26.0–34.0)
MCHC: 32.6 g/dL (ref 30.0–36.0)
MCV: 91.4 fL (ref 80.0–100.0)
Monocytes Absolute: 0.4 10*3/uL (ref 0.1–1.0)
Monocytes Relative: 7 %
Neutro Abs: 4.3 10*3/uL (ref 1.7–7.7)
Neutrophils Relative %: 69 %
Platelets: 185 10*3/uL (ref 150–400)
RBC: 3.96 MIL/uL (ref 3.87–5.11)
RDW: 13.2 % (ref 11.5–15.5)
WBC: 6.3 10*3/uL (ref 4.0–10.5)
nRBC: 0 % (ref 0.0–0.2)

## 2021-09-14 NOTE — ED Provider Notes (Incomplete)
I provided a substantive portion of the care of this patient.  I personally performed the entirety of the {Shared Choices:25152} for this encounter. ?{Remember to document shared critical care using "edcritical" dot phrase:1} ?EKG Interpretation ? ?Date/Time:  Saturday September 14 2021 17:23:50 EDT ?Ventricular Rate:  66 ?PR Interval:  188 ?QRS Duration: 78 ?QT Interval:  396 ?QTC Calculation: 415 ?R Axis:   3 ?Text Interpretation: Normal sinus rhythm Low voltage QRS Cannot rule out Anterior infarct , age undetermined Abnormal ECG No previous ECGs available no STEMI. ISOLATED PROMINENT T wave V2 Confirmed by Charlesetta Shanks [phone]) on 09/14/2021 8:05:00 PM ?  ?

## 2021-09-14 NOTE — Discharge Instructions (Signed)
Please read and follow all provided instructions. ? ?Your diagnoses today include:  ?1. Vasovagal syncope   ?2. Vertigo   ?3. Cardiac murmur   ? ? ?Tests performed today include: ?An EKG of your heart ?Cardiac enzymes - a blood test for heart muscle damage ?Blood counts and electrolytes ?Vital signs. See below for your results today.  ? ?Medications prescribed:  ?None ? ?Take any prescribed medications only as directed. ? ?Follow-up instructions: ?Please follow-up with your primary care provider as soon as you can for further evaluation of your symptoms.  ? ?Return instructions:  ?SEEK IMMEDIATE MEDICAL ATTENTION IF: ?You have severe chest pain, especially if the pain is crushing or pressure-like and spreads to the arms, back, neck, or jaw, or if you have sweating, nausea or vomiting, or trouble with breathing. THIS IS AN EMERGENCY. Do not wait to see if the pain will go away. Get medical help at once. Call 911. DO NOT drive yourself to the hospital.  ?Your chest pain gets worse and does not go away after a few minutes of rest.  ?You have an attack of chest pain lasting longer than what you usually experience.  ?You have significant dizziness, if you pass out, or have trouble walking.  ?You have chest pain not typical of your usual pain for which you originally saw your caregiver.  ?You have any other emergent concerns regarding your health. ? ?Additional Information: ?Chest pain comes from many different causes. Your caregiver has diagnosed you as having chest pain that is not specific for one problem, but does not require admission.  You are at low risk for an acute heart condition or other serious illness.  ? ?Your vital signs today were: ?BP 131/70   Pulse 64   Temp 97.8 ?F (36.6 ?C)   Resp 13   SpO2 98%  ?If your blood pressure (BP) was elevated above 135/85 this visit, please have this repeated by your doctor within one month. ?-------------- ? ? ?

## 2021-09-14 NOTE — ED Triage Notes (Signed)
Pt reports h/o dizziness r/t inner ear issues and that last night she fell while going to bathroom, injured her right knee, maintained consciousness.  This morning when she got up from bed, she passed out, awoke on the floor, found by granddaughter and continues to have right knee pain.  Pt denies any other pain or injury.  Pt denies any dizziness at present. ?

## 2021-09-14 NOTE — ED Provider Notes (Signed)
?[place] [facility] ?Provider Note ? ? ?CSN: [id number] ?Arrival date & time: 09/14/21  1712 ? ?  ? ?History ? ?Chief Complaint  ?Patient presents with  ? Fall  ? ? ?Madison Craig is a 76 y.o. female. ? ?Patient presents to the emergency department today for evaluation of dizziness and syncopal episode this morning.  Patient has a history of heart murmur and history of vertigo.  The vertigo has been ongoing for years, more frequent recently.  Patient states that she got out of bed earlier this morning and had acute onset of severe vertigo.  This caused her to fall.  She fell onto her right knee and had pain.  She was helped by family back into bed.  She has been able to ambulate but does state continued right knee pain.  This morning she got up and out of bed.  She states that she was suddenly struck by severe nausea.  She states that she has not had nausea like this for years.  She then passed out and fell to the floor.  No apparent head injury or seizure-like activity.  She awoke on the floor and was again assisted by family.  She denies associated chest pain or shortness of breath.  She did not feel dizzy at the time that she fell.  Patient's knee pain continued today, prompting emergency department visit.  Currently she has no complaints otherwise.  She has been eating and drinking well.  She has not had any vomiting or diarrhea.  Patient had an echocardiogram back in 2015 which did not show any problems.  Murmur was recognized in 2019 and patient was referred to cardiology, however they were just monitoring at that time and did not repeat echocardiography. ? ? ?  ? ?Home Medications ?Prior to Admission medications   ?Medication Sig Start Date End Date Taking? Authorizing Provider  ?Acetaminophen (TYLENOL PO) Take by mouth as needed.    [provider]  ?albuterol (VENTOLIN HFA) 108 (90 Base) MCG/ACT inhaler Inhale 2 puffs into the lungs every 4 (four) hours as needed for  wheezing or shortness of breath. 07/01/19   Sharion Balloon, NP  ?diclofenac (VOLTAREN) 75 MG EC tablet Take 1 tablet by mouth twice daily as needed 07/22/21   Leone Haven, MD  ?fluticasone [facility]) 50 MCG/ACT nasal spray Place 2 sprays into both nostrils daily. 10/05/20   Leone Haven, MD  ?omeprazole (PRILOSEC) 40 MG capsule Take 1 capsule by mouth once daily 06/19/21   Leone Haven, MD  ?Vitamin D, Ergocalciferol, (DRISDOL) 1.25 MG (50000 UNIT) CAPS capsule Take 1 capsule (50,000 Units total) by mouth every 7 (seven) days. 11/11/20   Leone Haven, MD  ?   ? ?Allergies    ?Patient has no known allergies.   ? ?Review of Systems   ?Review of Systems ? ?Physical Exam ?Updated Vital Signs ?BP 134/74   Pulse 63   Temp 97.8 ?F (36.6 ?C)   Resp 15   SpO2 96%  ?Physical Exam ?Vitals and nursing note reviewed.  ?Constitutional:   ?   Appearance: She is well-developed. She is not diaphoretic.  ?HENT:  ?   Head: Normocephalic and atraumatic.  ?   Mouth/Throat:  ?   Mouth: Mucous membranes are not dry.  ?Eyes:  ?   Conjunctiva/sclera: Conjunctivae normal.  ?Neck:  ?   Vascular: Normal carotid pulses. No JVD.  ?   Trachea: Trachea normal. No tracheal deviation.  ?Cardiovascular:  ?  Rate and Rhythm: Normal rate and regular rhythm.  ?   Pulses: No decreased pulses.     ?     Radial pulses are 2+ on the right side and 2+ on the left side.  ?   Heart sounds: S1 normal and S2 normal. Murmur heard.  ?   Comments: Left sternal border, II/VI holosystolic ?Pulmonary:  ?   Effort: Pulmonary effort is normal. No respiratory distress.  ?   Breath sounds: No wheezing.  ?Chest:  ?   Chest wall: No tenderness.  ?Abdominal:  ?   General: Bowel sounds are normal.  ?   Palpations: Abdomen is soft.  ?   Tenderness: There is no abdominal tenderness. There is no guarding or rebound.  ?Musculoskeletal:     ?   General: Normal range of motion.  ?   Cervical back: Normal range of motion and neck supple. No muscular tenderness.   ?   Right hip: No tenderness or bony tenderness. Normal range of motion.  ?   Left hip: No tenderness or bony tenderness. Normal range of motion.  ?   Right knee: Bony tenderness present. Normal range of motion. Tenderness present.  ?   Left knee: Normal range of motion. No tenderness.  ?Skin: ?   General: Skin is warm and dry.  ?   Coloration: Skin is not pale.  ?Neurological:  ?   Mental Status: She is alert.  ? ? ?ED Results / Procedures / Treatments   ?Labs ?(all labs ordered are listed, but only abnormal results are displayed) ?Labs Reviewed  ?CBC WITH DIFFERENTIAL/PLATELET - Abnormal; Notable for the following components:  ?    Result Value  ? Hemoglobin 11.8 (*)   ? All other components within normal limits  ?COMPREHENSIVE METABOLIC PANEL - Abnormal; Notable for the following components:  ? Glucose, Bld 100 (*)   ? Creatinine, Ser 1.09 (*)   ? GFR, Estimated 53 (*)   ? All other components within normal limits  ?TROPONIN I (HIGH SENSITIVITY)  ? ? ?EKG ?EKG Interpretation ? ?Date/Time:  Saturday September 14 2021 17:23:50 EDT ?Ventricular Rate:  66 ?PR Interval:  188 ?QRS Duration: 78 ?QT Interval:  396 ?QTC Calculation: 415 ?R Axis:   3 ?Text Interpretation: Normal sinus rhythm Low voltage QRS Cannot rule out Anterior infarct , age undetermined Abnormal ECG No previous ECGs available no STEMI. ISOLATED PROMINENT T wave V2 Confirmed by Charlesetta Shanks [phone]) on 09/14/2021 8:05:00 PM ? ?Radiology ?DG Knee Complete 4 Views Right ? ?Result Date: 09/14/2021 ?CLINICAL DATA:  injury + pain EXAM: RIGHT KNEE - COMPLETE 4+ VIEW COMPARISON:  X-ray knee right 02/05/2015 FINDINGS: No evidence of fracture, dislocation, or joint effusion. Interval worsening of tricompartmental severe degenerative changes of the knee. No aggressive appearing focal bone abnormality. Soft tissues are unremarkable. IMPRESSION: 1. No acute displaced fracture or dislocation. 2. Interval worsening of tricompartmental severe degenerative changes of the  knee. Electronically Signed   By: Iven Finn M.D.   On: 09/14/2021 17:49   ? ?Procedures ?Procedures  ? ? ?Medications Ordered in ED ?Medications - No data to display ? ?ED Course/ Medical Decision Making/ A&P ?  ? ?Patient seen and examined. History obtained directly from patient and family member at bedside.  Also reviewed previous cardiology notes. ? ?Labs/EKG: Ordered CBC, CMP, troponin, EKG. ? ?Imaging: Ordered x-ray of the knee. ? ?Medications/Fluids: None ordered ? ?Most recent vital signs reviewed and are as follows: ?BP 134/74   Pulse  63   Temp 97.8 ?F (36.6 ?C)   Resp 15   SpO2 96%  ? ?Initial impression: Chronic vertigo, right knee pain from fall.  Syncopal episode in setting of nausea this morning.  Cardiac murmur. ? ?8:23 PM Reassessment performed. Patient appears stable. ? ?Labs personally reviewed and interpreted including: CBC with minimal anemia at 11.8 otherwise unremarkable; CMP with mildly elevated creatinine at 1.09 otherwise unremarkable; troponin 3 normal.  EKG reviewed without any concerning findings or arrhythmias.  ? ?Reviewed pertinent lab work and imaging with patient at bedside. Questions answered.  ? ?Most current vital signs reviewed and are as follows: ?BP 131/70   Pulse 64   Temp 97.8 ?F (36.6 ?C)   Resp 13   SpO2 98%  ? ?Patient discussed with and seen by Dr. Johnney Killian. ? ?Plan: Discharge to home.  ? ?Prescriptions written for: None ? ?Other home care instructions discussed: Rest, good hydration ? ?ED return instructions discussed: Patient encouraged to return with chest pain, shortness of breath, significant lightheadedness or passing out spells, or other concerns. ? ?Follow-up instructions discussed: Patient encouraged to follow-up with their PCP in 3 days.  Discussed that she should probably be reevaluated by her cardiologist given continued murmur, now in setting of recent syncopal spell.  Murmur was discovered in 2019 and she did not have an echo to evaluate at that  time but was just being monitored given lack of other symptoms. ? ? ? ? ? ?                        ?Medical Decision Making ?Amount and/or Complexity of Data Reviewed ?Labs: ordered. ?Radiology: ordered. ? ? ?Fraser Din

## 2021-09-20 ENCOUNTER — Encounter: Payer: Self-pay | Admitting: Family Medicine

## 2021-09-20 ENCOUNTER — Other Ambulatory Visit: Payer: Self-pay | Admitting: Family Medicine

## 2021-09-20 ENCOUNTER — Ambulatory Visit (INDEPENDENT_AMBULATORY_CARE_PROVIDER_SITE_OTHER): Payer: Medicare HMO | Admitting: Family Medicine

## 2021-09-20 VITALS — BP 130/70 | HR 93 | Temp 98.6°F | Ht 61.0 in | Wt 184.2 lb

## 2021-09-20 DIAGNOSIS — R55 Syncope and collapse: Secondary | ICD-10-CM

## 2021-09-20 DIAGNOSIS — S8991XA Unspecified injury of right lower leg, initial encounter: Secondary | ICD-10-CM

## 2021-09-20 DIAGNOSIS — J309 Allergic rhinitis, unspecified: Secondary | ICD-10-CM

## 2021-09-20 DIAGNOSIS — R42 Dizziness and giddiness: Secondary | ICD-10-CM

## 2021-09-20 HISTORY — DX: Syncope and collapse: R55

## 2021-09-20 MED ORDER — AZELASTINE HCL 0.1 % NA SOLN: 2.0000 | Freq: Two times a day (BID) | NASAL | 12 refills | Status: DC

## 2021-09-20 MED ORDER — OMEPRAZOLE 40 MG PO CPDR: 40.0000 mg | DELAYED_RELEASE_CAPSULE | Freq: Every day | ORAL | 3 refills | Status: DC

## 2021-09-20 NOTE — Assessment & Plan Note (Signed)
Likely a vasovagal episode with the nausea. I will refer to cardiology for further evaluation. Discussed monitoring for recurrent symptoms and going back to the ED if she passes out again. Advised not to drive if she is having any presyncopal symptoms.  ?

## 2021-09-20 NOTE — Patient Instructions (Signed)
Nice to see you. ?We will get you referred back to cardiology for follow-up. ?Please try the astelin nasal spray for your allergy symptoms.  ?

## 2021-09-20 NOTE — Assessment & Plan Note (Signed)
Likely a combination of eustachian tube dysfunction and BPPV. We will treat allergies as outlined to see if that will help with her eustachian tube dysfunction.  ?

## 2021-09-20 NOTE — Assessment & Plan Note (Signed)
Continues to have issues with eustachian tube dysfunction and congestion. I will add in astelin 2 sprays each nostril BID for her to try. If she has side effects with this she will let us know. She will continue flonase and zyrtec. ?

## 2021-09-20 NOTE — Progress Notes (Signed)
?Tommi Rumps, MD ?Phone: [phone] ? ?Madison Craig is a 76 y.o. female who presents today for f/u. ? ?Allergic rhinitis/dizziness: the patient had an episode of dizziness one week ago that occurred after getting out of bed. She noted she fell to the floor and hurt her right knee. She got back in bed when this occurred and slept until the morning. She notes no significant recurrent dizziness. She has a long history of vertigo and has seen ENT. She noted they advised there was nothing they could do for her. She does have chronic allergy symptoms and notes zyrtec and flonase have been helpful for the dizziness.  ? ?Syncope: later in the morning well after her vertigo episode the patient developed significant nausea and then had a syncopal episode. She noted no CP or palpitations prior to passing out. She noted no post-ictal phase. No incontinence. She was evaluated in the ED with a reassuring work up. Her troponin was negative. Her EKG did not reveal any concerning aspects. She notes no recurrence of the nausea or syncopal episode.  ? ?Social History  ? ?Tobacco Use  ?Smoking Status Former  ? Packs/day: 2.00  ? Years: 16.00  ? Pack years: 32.00  ? Types: Cigarettes  ? Quit date: 09/28/1981  ? Years since quitting: 40.0  ?Smokeless Tobacco Never  ? ? ?Current Outpatient Medications on File Prior to Visit  ?Medication Sig Dispense Refill  ? Acetaminophen (TYLENOL PO) Take by mouth as needed.    ? albuterol (VENTOLIN HFA) 108 (90 Base) MCG/ACT inhaler Inhale 2 puffs into the lungs every 4 (four) hours as needed for wheezing or shortness of breath. 18 g 0  ? diclofenac (VOLTAREN) 75 MG EC tablet Take 1 tablet by mouth twice daily as needed 60 tablet 2  ? fluticasone (FLONASE) 50 MCG/ACT nasal spray Place 2 sprays into both nostrils daily. 16 g 6  ? omeprazole (PRILOSEC) 40 MG capsule Take 1 capsule by mouth once daily 90 capsule 0  ? Vitamin D, Ergocalciferol, (DRISDOL) 1.25 MG (50000 UNIT) CAPS capsule Take  1 capsule (50,000 Units total) by mouth every 7 (seven) days. 8 capsule 0  ? ?No current facility-administered medications on file prior to visit.  ? ? ? ?ROS see history of present illness ? ?Objective ? ?Physical Exam ?Vitals:  ? 09/20/21 0900  ?BP: 130/70  ?Pulse: 93  ?Temp: 98.6 ?F (37 ?C)  ?SpO2: 98%  ? ? ?BP Readings from Last 3 Encounters:  ?09/20/21 130/70  ?09/14/21 131/70  ?08/13/21 125/70  ? ?Wt Readings from Last 3 Encounters:  ?09/20/21 184 lb 3.2 oz (83.6 kg)  ?08/13/21 181 lb 6.4 oz (82.3 kg)  ?06/07/21 186 lb 6.4 oz (84.6 kg)  ? ? ?Physical Exam ?Constitutional:   ?   General: She is not in acute distress. ?   Appearance: She is not diaphoretic.  ?HENT:  ?   Right Ear: Tympanic membrane normal.  ?   Left Ear: Tympanic membrane normal.  ?Cardiovascular:  ?   Rate and Rhythm: Normal rate and regular rhythm.  ?   Heart sounds: Normal heart sounds.  ?Pulmonary:  ?   Effort: Pulmonary effort is normal.  ?   Breath sounds: Normal breath sounds.  ?Musculoskeletal:  ?   Comments: Right knee with tenderness in the soft tissues laterally, she has pain medially on mcmurrays though no clicking  ?Skin: ?   General: Skin is warm and dry.  ?Neurological:  ?   Mental Status: She is  alert.  ? ? ? ?Assessment/Plan: Please see individual problem list. ? ?Problem List Items Addressed This Visit   ? ? Allergic rhinitis (Chronic)  ?  Continues to have issues with eustachian tube dysfunction and congestion. I will add in astelin 2 sprays each nostril BID for her to try. If she has side effects with this she will let us know. She will continue flonase and zyrtec. ? ?  ?  ? Relevant Medications  ? azelastine (ASTELIN) 0.1 % nasal spray  ? Dizziness - Primary (Chronic)  ?  Likely a combination of eustachian tube dysfunction and BPPV. We will treat allergies as outlined to see if that will help with her eustachian tube dysfunction.  ? ?  ?  ? Relevant Medications  ? azelastine (ASTELIN) 0.1 % nasal spray  ? Right leg injury  ?   No acute changes on x-ray. Likely soft tissue bruising.  She will contact her orthopedist for further evaluation.  ? ?  ?  ? Syncope  ?  Likely a vasovagal episode with the nausea. I will refer to cardiology for further evaluation. Discussed monitoring for recurrent symptoms and going back to the ED if she passes out again. Advised not to drive if she is having any presyncopal symptoms.  ? ?  ?  ? Relevant Orders  ? Ambulatory referral to Cardiology  ? ? ?Return in about 3 months (around 12/20/2021). ? ?This visit occurred during the SARS-CoV-2 public health emergency.  Safety protocols were in place, including screening questions prior to the visit, additional usage of staff PPE, and extensive cleaning of exam room while observing appropriate contact time as indicated for disinfecting solutions.  ? ? ?Tommi Rumps, MD ?[place] ? ?

## 2021-09-20 NOTE — Assessment & Plan Note (Signed)
No acute changes on x-ray. Likely soft tissue bruising.  She will contact her orthopedist for further evaluation.  ?

## 2021-09-30 ENCOUNTER — Telehealth: Payer: Self-pay | Admitting: Family Medicine

## 2021-09-30 NOTE — Telephone Encounter (Signed)
Copied from [place] [id number]. Topic: Medicare AWV ?>> Sep 30, 2021 11:31 AM Harris-Coley, Hannah Beat wrote: ?Reason for CRM: Left message for patient to schedule Annual Wellness Visit.  Please schedule with Nurse Health Advisor Denisa O'Brien-Blaney, LPN at [facility].  Please call [phone] ask for Juliann Pulse ?

## 2021-10-07 ENCOUNTER — Telehealth: Payer: Self-pay | Admitting: *Deleted

## 2021-10-07 NOTE — Telephone Encounter (Signed)
Left voicemail to discuss Prolia. ? ?Based on labs in March 2023-pt is to hold off on Prolia for now. Will wait for provider to let us know when she becomes eligible to restart Prolia. ? ?Her vitamin d is mildly low. She should take vitamin D 1000 IU over the counter daily. This needs to be rechecked in 8 weeks. We can not start the prolia until her vitamin D is in the normal range ?

## 2021-10-07 NOTE — Telephone Encounter (Signed)
Moved Prolia card to "needs follow-up" ?

## 2021-10-08 NOTE — Progress Notes (Signed)
Cardiology Office Note ? ?Date:  10/09/2021  ? ?ID:  Madison Craig, DOB Dec 28, 1945, MRN [id number] ? ?PCP:  Leone Haven, MD  ? ?Chief Complaint  ?Patient presents with  ? New Patient (Initial Visit)  ?  Ref by Dr. Caryl Bis for syncope. Medications reviewed by the patient verbally.   ? ? ?HPI:  ?Madison Craig is a pleasant 76 year old woman with past medical history of ?arthritis ?Osa, not on CPAP ?Smoker, 16 years ?Obesity ?History of vertigo ?Who presents by referral from Dr. Caryl Bis for syncope  ? ?Last seen in clinic by myself September 2019 ? ?Seen in the emergency room September 14, 2021 ?Reported having dizziness and syncope episode ?Per the ER notes  "got out of bed earlier this morning and had acute onset of severe vertigo.  This caused her to fall.  She fell onto her right knee and had pain.  She was helped by family back into bed.  She has been able to ambulate but does state continued right knee pain.  This morning she got up and out of bed.  She states that she was suddenly struck by severe nausea.  She states that she has not had nausea like this for years.  She then passed out and fell to the floor.  No apparent head injury or seizure-like activity.  She awoke on the floor and was again assisted by family." ? ?Prior echocardiogram December 2015 ?No acute abnormalities noted at that time ? ?Long discussion concerning recent events from syncope in April ?Balance is bad, fell that night from leg weakness ?Vertigo symptoms ?Got back up, went back to bed slept fine ?Next morning, "deathly sick" with nausea ?Acute LOC in kitchen, woke up on the ground ? ?BP low on today's visit, minimally orthostatic systolic of 212 ? 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 ?Does not wear CPAP ? ?EKG personally reviewed by myself on todays visit ?Shows normal sinus rhythm rate 76 bpm no significant ST or T wave changes ? ?PMH:   has a past medical history of Anemia, Arthritis, Basal cell  carcinoma, Benign neoplasm of ascending colon, Dyspnea (09/28/2013), GERD (gastroesophageal reflux disease), Heart murmur, Heme + stool, Macular degeneration (01/11/2014), Multiple thyroid nodules, Osteoporosis (09/18/2014), Reflux esophagitis, and SLEEP APNEA (03/22/2009). ? ?PSH:    ?Past Surgical History:  ?Procedure Laterality Date  ? BREAST SURGERY  1988  ? breast biopsy  ? CARDIAC CATHETERIZATION  2006  ? No Stents/[facility]  ? COLONOSCOPY WITH PROPOFOL N/A 04/24/2017  ? Procedure: COLONOSCOPY WITH PROPOFOL;  Surgeon: Virgel Manifold, MD;  Location: [facility] ENDOSCOPY;  Service: Endoscopy;  Laterality: N/A;  ? ENDOVENOUS ABLATION SAPHENOUS VEIN W/ LASER Right 04/17/2021  ? endovenous laser ablation right greater saphenous vein and stab phlebectomy > 20 incisions right leg by Gae Gallop MD  ? ESOPHAGOGASTRODUODENOSCOPY (EGD) WITH PROPOFOL N/A 04/24/2017  ? Procedure: ESOPHAGOGASTRODUODENOSCOPY (EGD) WITH PROPOFOL;  Surgeon: Virgel Manifold, MD;  Location: [facility] ENDOSCOPY;  Service: Endoscopy;  Laterality: N/A;  ? eye    ? cateract surgery  ? ? ?Current Outpatient Medications  ?Medication Sig Dispense Refill  ? Acetaminophen (TYLENOL PO) Take by mouth as needed.    ? albuterol (VENTOLIN HFA) 108 (90 Base) MCG/ACT inhaler Inhale 2 puffs into the lungs every 4 (four) hours as needed for wheezing or shortness of breath. 18 g 0  ? azelastine (ASTELIN) 0.1 % nasal spray Place 2 sprays into both nostrils 2 (two) times daily. Use in  each nostril as directed 30 mL 12  ? diclofenac (VOLTAREN) 75 MG EC tablet Take 1 tablet by mouth twice daily as needed 60 tablet 2  ? fluticasone (FLONASE) 50 MCG/ACT nasal spray Place 2 sprays into both nostrils daily. 16 g 6  ? omeprazole (PRILOSEC) 40 MG capsule Take 1 capsule (40 mg total) by mouth daily. 90 capsule 3  ? Vitamin D, Ergocalciferol, (DRISDOL) 1.25 MG (50000 UNIT) CAPS capsule Take 1 capsule (50,000 Units total) by mouth every 7 (seven) days. 8 capsule 0  ? ?No current  facility-administered medications for this visit.  ? ? ? ?Allergies:   Patient has no known allergies.  ? ?Social History:  The patient  reports that she quit smoking about 40 years ago. Her smoking use included cigarettes. She has a 32.00 pack-year smoking history. She has never used smokeless tobacco. She reports that she does not drink alcohol and does not use drugs.  ? ?Family History:   family history includes Alcohol abuse in her sister; Aneurysm in her sister; Arrhythmia in her brother; Atrial fibrillation in her brother, mother, and sister; Dementia in her mother; Diabetes (age of onset: 76) in her son; Heart attack in her father and mother; Heart disease (age of onset: 29) in her father; Hodgkin's lymphoma in her son; Hyperlipidemia in her father; Hypertension in her father and mother; Stroke in her mother; Stroke (age of onset: 43) in her sister; Sudden death in her father; Valvular heart disease in her sister.  ? ? ?Review of Systems: ?Review of Systems  ?Constitutional: Negative.   ?HENT: Negative.    ?Respiratory: Negative.    ?Gastrointestinal: Negative.   ?Musculoskeletal: Negative.   ?Neurological:  Positive for dizziness and loss of consciousness.  ?All other systems reviewed and are negative. ? ? ?PHYSICAL EXAM: ?VS:  BP 120/68 (BP Location: Right Arm, Patient Position: Sitting, Cuff Size: Normal)   Pulse 76   Ht '5\' 1"'$  (1.549 m)   Wt 184 lb 4 oz (83.6 kg)   SpO2 96%   BMI 34.81 kg/m?  , BMI Body mass index is 34.81 kg/m?Marland Kitchen ?Constitutional:  oriented to person, place, and time. No distress.  ?HENT:  ?Head: Grossly normal ?Eyes:  no discharge. No scleral icterus.  ?Neck: No JVD, no carotid bruits  ?Cardiovascular: Regular rate and rhythm, no murmurs appreciated ?Pulmonary/Chest: Clear to auscultation bilaterally, no wheezes or rails ?Abdominal: Soft.  no distension.  no tenderness.  ?Musculoskeletal: Normal range of motion ?Neurological:  normal muscle tone. Coordination normal. No atrophy ?Skin:  Skin warm and dry ?Psychiatric: normal affect, pleasant ? ?Recent Labs: ?09/14/2021: ALT 16; BUN 22; Creatinine, Ser 1.09; Hemoglobin 11.8; Platelets 185; Potassium 4.3; Sodium 135  ? ? ?Lipid Panel ?Lab Results  ?Component Value Date  ? CHOL 180 08/13/2021  ? HDL 60.20 08/13/2021  ? LDLCALC 102 (H) 08/13/2021  ? TRIG 92.0 08/13/2021  ? ?  ? ?Wt Readings from Last 3 Encounters:  ?10/09/21 184 lb 4 oz (83.6 kg)  ?09/20/21 184 lb 3.2 oz (83.6 kg)  ?08/13/21 181 lb 6.4 oz (82.3 kg)  ?  ? ? ? ?ASSESSMENT AND PLAN: ? ?Loss of consciousness ?Consistent with vasovagal syncope ?In the setting of profound nausea, vertigo the night before ?Blood pressure very low likely a contributor with the appropriate GI trigger ?Recommend she stay hydrated, liberalize salt intake ?No medications to hold ?Echocardiogram ordered to update given syncope and murmur ?Carotid ultrasound ordered at her request ? ?Murmur ?Low-grade murmur on exam ?Echocardiogram  ordered to update ? ?Hypotension ?Recommend she stay hydrated, liberalize salt intake ?Denies recent weight loss ? ?Pure hypercholesterolemia ?Cholesterol is very reasonable without any cholesterol medication ?Stable ? ?Chronic leg pain ?Reports having severe knee pain, needs knee replacement surgery ?Recommend she follow back up with orthopedics ? ?Vertigo ?Reports long history of vertigo symptoms ?Provided her with meclizine prescription to take as needed ? ? Total encounter time more than 45 minutes ? Greater than 50% was spent in counseling and coordination of care with the patient ? ? ?No orders of the defined types were placed in this encounter. ? ? ? ?Signed, ?Esmond Plants, M.D., Ph.D. ?10/09/2021  ?[place] ?[phone] ? ?

## 2021-10-09 ENCOUNTER — Encounter: Payer: Self-pay | Admitting: Cardiovascular Disease

## 2021-10-09 ENCOUNTER — Ambulatory Visit: Payer: Medicare HMO | Admitting: Cardiovascular Disease

## 2021-10-09 VITALS — BP 120/68 | HR 76 | Ht 61.0 in | Wt 184.2 lb

## 2021-10-09 DIAGNOSIS — R011 Cardiac murmur, unspecified: Secondary | ICD-10-CM | POA: Diagnosis not present

## 2021-10-09 DIAGNOSIS — R42 Dizziness and giddiness: Secondary | ICD-10-CM | POA: Diagnosis not present

## 2021-10-09 DIAGNOSIS — R55 Syncope and collapse: Secondary | ICD-10-CM

## 2021-10-09 MED ORDER — MECLIZINE HCL 25 MG PO TABS: 25.0000 mg | ORAL_TABLET | Freq: Three times a day (TID) | ORAL | 0 refills | Status: DC | PRN

## 2021-10-09 NOTE — Patient Instructions (Addendum)
Medication Instructions:  ?Meclizine 25 mg up to three times a day as needed for vertigo ? ?If you need a refill on your cardiac medications before your next appointment, please call your pharmacy.  ? ?Lab work: ?No new labs needed ? ?Testing/Procedures: ? ?Your physician has requested that you have an echocardiogram. Echocardiography is a painless test that uses sound waves to create images of your heart. It provides your doctor with information about the size and shape of your heart and how well your heart?s chambers and valves are working. This procedure takes approximately one hour. There are no restrictions for this procedure.  ? ?Your physician has requested that you have a carotid duplex. This test is an ultrasound of the carotid arteries in your neck. It looks at blood flow through these arteries that supply the brain with blood. Allow one hour for this exam. There are no restrictions or special instructions.  ? ?Follow-Up: ?At [facility], you and your health needs are our priority.  As part of our continuing mission to provide you with exceptional heart care, we have created designated Provider Care Teams.  These Care Teams include your primary Cardiologist (physician) and Advanced Practice Providers (APPs -  Physician Assistants and Nurse Practitioners) who all work together to provide you with the care you need, when you need it. ? ?You will need a follow up appointment as needed ? ?Providers on your designated Care Team:   ?Murray Hodgkins, NP ?Christell Faith, PA-C ?Cadence Kathlen Mody, PA-C ? ?COVID-19 Vaccine Information can be found at: [email or web address].co.uk For questions related to vaccine distribution or appointments, please email [email or web address] or call [phone].  ? ?

## 2021-10-31 ENCOUNTER — Telehealth: Payer: Self-pay | Admitting: Family Medicine

## 2021-10-31 NOTE — Telephone Encounter (Signed)
Copied from [place] [phone]. Topic: Medicare AWV >> Oct 31, 2021  1:27 PM Devoria Glassing wrote: Reason for CRM: Left message for patient to schedule Annual Wellness Visit.  Please schedule with Nurse Health Advisor Denisa O'Brien-Blaney, LPN at [facility].  Please call [phone] ask for [facility]

## 2021-11-05 NOTE — Telephone Encounter (Signed)
Pt has not scheduled 8 week f/u to recheck Vitamin D. She is now overdue & will need this before we can restart Prolia. Please schedule.

## 2021-11-06 NOTE — Telephone Encounter (Signed)
I called and LVM for the patient to call back to schedule a lab appointment to recheck her Vitamin D level.  Madison Craig,cma

## 2021-11-07 ENCOUNTER — Other Ambulatory Visit: Payer: Self-pay

## 2021-11-07 ENCOUNTER — Telehealth: Payer: Self-pay | Admitting: Family Medicine

## 2021-11-07 DIAGNOSIS — E559 Vitamin D deficiency, unspecified: Secondary | ICD-10-CM

## 2021-11-07 NOTE — Telephone Encounter (Signed)
Pt called stating cma wanted to talk to her about her vitamin D. Pt does not know if she need to make an appointment or not

## 2021-11-07 NOTE — Telephone Encounter (Signed)
I called the patient  and LVM for the patient to call back and schedule a lab appointment to check her vitamin D level for her Prolia.  Lenwood Balsam,cma

## 2021-11-15 ENCOUNTER — Ambulatory Visit (INDEPENDENT_AMBULATORY_CARE_PROVIDER_SITE_OTHER): Payer: Medicare HMO

## 2021-11-15 DIAGNOSIS — R55 Syncope and collapse: Secondary | ICD-10-CM | POA: Diagnosis not present

## 2021-11-15 DIAGNOSIS — R011 Cardiac murmur, unspecified: Secondary | ICD-10-CM

## 2021-11-15 LAB — ECHOCARDIOGRAM COMPLETE
AR max vel: 1.82 cm2
AV Area VTI: 1.9 cm2
AV Area mean vel: 1.77 cm2
AV Mean grad: 12 mmHg
AV Peak grad: 21.8 mmHg
Ao pk vel: 2.34 m/s
Area-P 1/2: 3.31 cm2
Calc EF: 57.7 %
S' Lateral: 2.9 cm
Single Plane A2C EF: 59.1 %
Single Plane A4C EF: 54.7 %

## 2021-11-18 ENCOUNTER — Telehealth: Payer: Self-pay | Admitting: Emergency Medicine

## 2021-11-18 NOTE — Telephone Encounter (Signed)
Called patient to go over results. No answer. Left detailed message per DPR.   Pt also advised that results have been released to her mychart by MD.

## 2021-11-29 ENCOUNTER — Telehealth: Payer: Self-pay | Admitting: Family Medicine

## 2021-11-29 ENCOUNTER — Encounter: Payer: Self-pay | Admitting: Oncology

## 2021-11-29 NOTE — Telephone Encounter (Signed)
Pt called wanting to get TB shot done for her job

## 2021-12-02 NOTE — Telephone Encounter (Signed)
Patient has a scheduled appointment at the [facility] for TB testing and I called and LVM for patient to call back to see if this appointment with [place] is needed.  Madison Craig,cma

## 2021-12-03 NOTE — Telephone Encounter (Signed)
I called and spoke with the patient and she stated she had a appointment scheduled with the health department and she no longer needed an appointment  with [place] for the TB screening. Madison Craig,cma

## 2021-12-06 ENCOUNTER — Ambulatory Visit (LOCAL_COMMUNITY_HEALTH_CENTER): Payer: Self-pay

## 2021-12-06 DIAGNOSIS — Z111 Encounter for screening for respiratory tuberculosis: Secondary | ICD-10-CM

## 2021-12-09 ENCOUNTER — Ambulatory Visit (LOCAL_COMMUNITY_HEALTH_CENTER): Payer: Self-pay

## 2021-12-09 DIAGNOSIS — Z111 Encounter for screening for respiratory tuberculosis: Secondary | ICD-10-CM

## 2021-12-09 LAB — TB SKIN TEST
Induration: 0 mm
TB Skin Test: NEGATIVE

## 2021-12-24 ENCOUNTER — Encounter: Payer: Self-pay | Admitting: Oncology

## 2021-12-25 ENCOUNTER — Ambulatory Visit: Payer: Medicare HMO

## 2021-12-25 DIAGNOSIS — H31011 Macula scars of posterior pole (postinflammatory) (post-traumatic), right eye: Secondary | ICD-10-CM | POA: Diagnosis not present

## 2021-12-30 ENCOUNTER — Ambulatory Visit (INDEPENDENT_AMBULATORY_CARE_PROVIDER_SITE_OTHER): Payer: Medicare PPO

## 2021-12-30 ENCOUNTER — Telehealth: Payer: Self-pay

## 2021-12-30 VITALS — Ht 61.0 in | Wt 184.0 lb

## 2021-12-30 DIAGNOSIS — Z0000 Encounter for general adult medical examination without abnormal findings: Secondary | ICD-10-CM

## 2021-12-30 NOTE — Progress Notes (Signed)
Subjective:   Madison Craig is a 76 y.o. female who presents for Medicare Annual (Subsequent) preventive examination.  Review of Systems    No ROS.  Medicare Wellness Virtual Visit.  Visual/audio telehealth visit, UTA vital signs.   See social history for additional risk factors.         Objective:    Today's Vitals   12/30/21 1310  Weight: 184 lb (83.5 kg)  Height: _0  (1.549 m)   Body mass index is 34.77 kg/m.     12/30/2021    1:09 PM 01/15/2021   10:05 AM 11/19/2020    9:22 AM 10/02/2020    8:51 AM 09/02/2019   10:43 AM 08/27/2018    9:25 AM 08/24/2017   11:19 AM  Advanced Directives  Does Patient Have a Medical Advance Directive? _1  No No  Would patient like information on creating a medical advance directive? No - Patient declined No - Patient declined  No - Patient declined No - Patient declined Yes (MAU/Ambulatory/Procedural Areas - Information given) Yes (MAU/Ambulatory/Procedural Areas - Information given)    Current Medications (verified) Outpatient Encounter Medications as of 12/30/2021  Medication Sig   Acetaminophen (TYLENOL PO) Take by mouth as needed.   albuterol (VENTOLIN HFA) 108 (90 Base) MCG/ACT inhaler Inhale 2 puffs into the lungs every 4 (four) hours as needed for wheezing or shortness of breath.   azelastine (ASTELIN) 0.1 % nasal spray Place 2 sprays into both nostrils 2 (two) times daily. Use in each nostril as directed   diclofenac (VOLTAREN) 75 MG EC tablet Take 1 tablet by mouth twice daily as needed   fluticasone (FLONASE) 50 MCG/ACT nasal spray Place 2 sprays into both nostrils daily.   meclizine (ANTIVERT) 25 MG tablet Take 1 tablet (25 mg total) by mouth 3 (three) times daily as needed for dizziness.   omeprazole (PRILOSEC) 40 MG capsule Take 1 capsule (40 mg total) by mouth daily.   Vitamin D, Ergocalciferol, (DRISDOL) 1.25 MG (50000 UNIT) CAPS capsule Take 1 capsule (50,000 Units total) by mouth every 7 (seven) days.   No  facility-administered encounter medications on file as of 12/30/2021.    Allergies (verified) Patient has no known allergies.   History: Past Medical History:  Diagnosis Date   Anemia    Arthritis    Basal cell carcinoma    Benign neoplasm of ascending colon    Dyspnea 09/28/2013   Pulmonary function testing done on 02/09/2014 showed minimal airway obstruction with a lack of response to bronchodilators; her FEV1 was normal, the FEV1/FVC ratio and the FEF 25-75% were reduced; the airway resistance was normal; lung volumes were within normal limits; the diffusing capacity was high for the measured volumes.     GERD (gastroesophageal reflux disease)    Heart murmur    Heme + stool    Macular degeneration 01/11/2014   Followed at [facility] in [place], [place].    Multiple thyroid nodules    Thyroid ultrasound on 01/25/2014 showed a multinodular goiter, with a 1.5 cm nodule in the inferior left thyroid lobe that met the criteria for ultrasound-guided biopsy.  On 02/06/2014 an ultrasound guided needle aspirate biopsy was performed of the left inferior thyroid nodule; cytopathology findings were consistent with non-neoplastic goiter.      Osteoporosis 09/18/2014   DXA 08/16/2014 showed osteopenia of right femur neck (T-score -2.3) and osteoporosis of left forearm radius (T-score -2.9).  Lumbar spine was not utilized due to advanced  degenerative changes.    Reflux esophagitis    SLEEP APNEA 03/22/2009   Had sleep study in [place] about 10 years, has BiPAP, cannot sleep with it on.     Past Surgical History:  Procedure Laterality Date   BREAST SURGERY  1988   breast biopsy   CARDIAC CATHETERIZATION  2006   No Stents/[facility]   COLONOSCOPY WITH PROPOFOL N/A 04/24/2017   Procedure: COLONOSCOPY WITH PROPOFOL;  Surgeon: Virgel Manifold, MD;  Location: [facility] ENDOSCOPY;  Service: Endoscopy;  Laterality: N/A;   ENDOVENOUS ABLATION SAPHENOUS VEIN W/ LASER Right 04/17/2021   endovenous laser  ablation right greater saphenous vein and stab phlebectomy > 20 incisions right leg by Gae Gallop MD   ESOPHAGOGASTRODUODENOSCOPY (EGD) WITH PROPOFOL N/A 04/24/2017   Procedure: ESOPHAGOGASTRODUODENOSCOPY (EGD) WITH PROPOFOL;  Surgeon: Virgel Manifold, MD;  Location: [facility] ENDOSCOPY;  Service: Endoscopy;  Laterality: N/A;   eye     cateract surgery   Family History  Problem Relation Age of Onset   Stroke Mother    Hypertension Mother    Dementia Mother    Heart attack Mother    Atrial fibrillation Mother    Heart disease Father 56   Sudden death Father    Hypertension Father    Hyperlipidemia Father    Heart attack Father    Stroke Sister 83   Alcohol abuse Sister    Valvular heart disease Sister    Atrial fibrillation Sister    Aneurysm Sister    Arrhythmia Brother    Atrial fibrillation Brother    Diabetes Son 78   Hodgkin's lymphoma Son    Colon cancer Neg Hx    Breast cancer Neg Hx    Social History   Socioeconomic History   Marital status: Widowed    Spouse name: Not on file   Number of children: 3   Years of education: Not on file   Highest education level: Not on file  Occupational History   Not on file  Tobacco Use   Smoking status: Former    Packs/day: 2.00    Years: 16.00    Total pack years: 32.00    Types: Cigarettes    Quit date: 09/28/1981    Years since quitting: 40.2   Smokeless tobacco: Never  Vaping Use   Vaping Use: Never used  Substance and Sexual Activity   Alcohol use: No   Drug use: No   Sexual activity: Never  Other Topics Concern   Not on file  Social History Narrative   Not on file   Social Determinants of Health   Financial Resource Strain: Low Risk  (12/30/2021)   Overall Financial Resource Strain (CARDIA)    Difficulty of Paying Living Expenses: Not hard at all  Food Insecurity: No Food Insecurity (12/30/2021)   Hunger Vital Sign    Worried About Running Out of Food in the Last Year: Never true    [place] in the  Last Year: Never true  Transportation Needs: No Transportation Needs (12/30/2021)   PRAPARE - Hydrologist (Medical): No    Lack of Transportation (Non-Medical): No  Physical Activity: Sufficiently Active (12/30/2021)   Exercise Vital Sign    Days of Exercise per Week: 5 days    Minutes of Exercise per Session: 30 min  Stress: No Stress Concern Present (12/30/2021)   [place]    Feeling of Stress : Not at all  Social Connections: Unknown (12/30/2021)   Social Connection and Isolation Panel [NHANES]    Frequency of Communication with Friends and Family: More than three times a week    Frequency of Social Gatherings with Friends and Family: More than three times a week    Attends Religious Services: Not on Advertising copywriter or Organizations: Not on file    Attends Archivist Meetings: Not on file    Marital Status: Not on file    Tobacco Counseling Counseling given: Not Answered   Clinical Intake:  Pre-visit preparation completed: Yes        Diabetes: No  How often do you need to have someone help you when you read instructions, pamphlets, or other written materials from your doctor or pharmacy?: 1 - Never    Interpreter Needed?: No      Activities of Daily Living    12/30/2021    1:16 PM  In your present state of health, do you have any difficulty performing the following activities:  Hearing? 0  Vision? 0  Difficulty concentrating or making decisions? 0  Walking or climbing stairs? 0  Dressing or bathing? 0  Preparing Food and eating ? N  Using the Toilet? N  In the past six months, have you accidently leaked urine? N  Do you have problems with loss of bowel control? N  Managing your Medications? N  Managing your Finances? N  Housekeeping or managing your Housekeeping? N    Patient Care Team: Leone Haven, MD as PCP - General (Family  Medicine)  Indicate any recent Medical Services you may have received from other than [facility] providers in the past year (date may be approximate).     Assessment:   This is a routine wellness examination for Jeda.  Virtual Visit via Telephone Note  I connected with  Carmin Alvidrez on 12/30/21 at  1:00 PM EDT by telephone and verified that I am speaking with the correct person using two identifiers.  Location: Patient: home Provider: office Persons participating in the virtual visit: patient/Nurse Health Advisor   I discussed the limitations of performing an evaluation and management service by telehealth. We continued and completed visit with audio only. Some vital signs may be absent or patient reported.   Hearing/Vision screen Hearing Screening - Comments:: Patient does have difficulty hearing conversational tones. Hearing aid, not currently worn. Audiology deferred in the last year per patient.  Vision Screening - Comments:: Followed by [facility])  Macular degeneration  Legally blind, R eye  Cataract extracted, L eye Wears corrective lenses when reading  They have regular follow up with the ophthalmologist  Dietary issues and exercise activities discussed:   Regular diet Good water intake   Goals Addressed               This Visit's Progress     Patient Stated     Healthy lifestyle (pt-stated)        Stay active. Healthy diet. Stay hydrated.        Depression Screen    12/30/2021    1:16 PM 08/13/2021    8:41 AM 03/06/2021    9:41 AM 10/02/2020    8:48 AM 04/06/2020    8:30 AM 09/02/2019   10:46 AM 03/11/2019    8:14 AM  PHQ 2/9 Scores  PHQ - 2 Score 0 0 0 0 0 0 0    Fall Risk    12/30/2021  1:22 PM 08/13/2021    8:41 AM 03/06/2021    9:41 AM 10/02/2020    8:53 AM 04/06/2020    8:30 AM  Fall Risk   Falls in the past year? 1 0 0 1 0  Number falls in past yr:  0 0 0 0  Injury with Fall?  0  0   Comment    Seating gave  way. Did not seek medical care.   Risk for fall due to :  No Fall Risks     Follow up Falls evaluation completed Falls evaluation completed  Falls evaluation completed Falls evaluation completed    [place]: Home free of loose throw rugs in walkways, pet beds, electrical cords, etc? Yes  Adequate lighting in your home to reduce risk of falls? Yes   ASSISTIVE DEVICES UTILIZED TO PREVENT FALLS: Life alert? No  Use of a cane, walker or w/c? No  Grab bars in the bathroom? Yes   TIMED UP AND GO: Was the test performed? No .   Cognitive Function: Patient [place] alert and oriented x3.      09/02/2019   10:58 AM 08/24/2017   11:44 AM 08/21/2016   11:30 AM  MMSE - Mini Mental State Exam  Not completed: Unable to complete    Orientation to time  5 5  Orientation to Place  5 5  Registration  3 3  Attention/ Calculation  5 5  Recall  2 3  Language- name 2 objects  2 2  Language- repeat  1 1  Language- follow 3 step command  3 3  Language- read & follow direction  1 1  Write a sentence  1 1  Copy design  1 1  Total score  29 30        08/27/2018    9:27 AM  6CIT Screen  What Year? 0 points  What month? 0 points  What time? 0 points  Count back from 20 0 points  Months in reverse 0 points  Repeat phrase 0 points  Total Score 0 points    Immunizations Immunization History  Administered Date(s) Administered   Fluad Quad(high Dose 65+) 03/11/2019, 03/06/2021   Influenza,inj,Quad PF,6+ Mos 04/05/2014, 02/05/2015, 01/23/2016, 04/06/2020   PFIZER(Purple Top)SARS-COV-2 Vaccination 01/16/2020, 02/09/2020   PPD Test 06/29/2020, 12/06/2021   Pfizer Covid-19 Vaccine Bivalent Booster 8yr & up 03/22/2021   Pneumococcal Conjugate-13 01/11/2014   Pneumococcal Polysaccharide-23 12/03/2015   Tdap 01/11/2014   Discontinued per patient.  Shingrix Completed?: No.    Education has been provided regarding the importance of this vaccine. Patient has been  advised to call insurance company to determine out of pocket expense if they have not yet received this vaccine. Advised may also receive vaccine at local pharmacy or Health Dept. Verbalized acceptance and understanding.  Screening Tests Health Maintenance  Topic Date Due   INFLUENZA VACCINE  12/24/2021   COVID-19 Vaccine (4 - Booster for Pfizer series) 01/15/2022 (Originally 05/17/2021)   COLONOSCOPY (Pts 45-475yrInsurance coverage will need to be confirmed)  10/03/2022 (Originally 04/24/2020)   MAMMOGRAM  12/21/2022   TETANUS/TDAP  01/12/2024   Pneumonia Vaccine 6547Years old  Completed   DEXA SCAN  Completed   Hepatitis C Screening  Completed   HPV VACCINES  Aged Out   Zoster Vaccines- Shingrix  Discontinued   Health Maintenance Health Maintenance Due  Topic Date Due   INFLUENZA VACCINE  12/24/2021   Lung Cancer Screening: (Low Dose  CT Chest recommended if Age 37-80 years, 30 pack-year currently smoking OR have quit w/in 15years.) does not qualify.   Vision Screening: Recommended annual ophthalmology exams for early detection of glaucoma and other disorders of the eye.  Dental Screening: Recommended annual dental exams for proper oral hygiene  Community Resource Referral / Chronic Care Management: CRR required this visit?  No   CCM required this visit?  No      Plan:   Keep all routine maintenance appointments.   I have personally reviewed and noted the following in the patient's chart:   Medical and social history Use of alcohol, tobacco or illicit drugs  Current medications and supplements including opioid prescriptions.  Functional ability and status Nutritional status Physical activity Advanced directives List of other physicians Hospitalizations, surgeries, and ER visits in previous 12 months Vitals Screenings to include cognitive, depression, and falls Referrals and appointments  In addition, I have reviewed and discussed with patient certain preventive  protocols, quality metrics, and best practice recommendations. A written personalized care plan for preventive services as well as general preventive health recommendations were provided to patient.     Varney Biles, LPN   10/26/9526

## 2021-12-30 NOTE — Patient Instructions (Addendum)
  Madison Craig , Thank you for taking time to come for your Medicare Wellness Visit. I appreciate your ongoing commitment to your health goals. Please review the following plan we discussed and let me know if I can assist you in the future.   These are the goals we discussed:  Goals       Patient Stated     Healthy lifestyle (pt-stated)      Stay active. Healthy diet. Stay hydrated.       Obtain Annual Eye (retinal)  Exam  (pt-stated)        This is a list of the screening recommended for you and due dates:  Health Maintenance  Topic Date Due   Flu Shot  12/24/2021   COVID-19 Vaccine (4 - Booster for Pfizer series) 01/15/2022*   Colon Cancer Screening  10/03/2022*   Mammogram  12/21/2022   Tetanus Vaccine  01/12/2024   Pneumonia Vaccine  Completed   DEXA scan (bone density measurement)  Completed   Hepatitis C Screening: USPSTF Recommendation to screen - Ages 70-79 yo.  Completed   HPV Vaccine  Aged Out   Zoster (Shingles) Vaccine  Discontinued  *Topic was postponed. The date shown is not the original due date.

## 2021-12-30 NOTE — Telephone Encounter (Signed)
Patient states she is returning Denisa O'Brien-Blaney, LPN's call.  Patient states she was at work when [employer] called and had to go out to her car.

## 2021-12-30 NOTE — Telephone Encounter (Signed)
Unable to reach patient for scheduled AWV. No answer. Left message okay to reschedule.

## 2022-01-18 ENCOUNTER — Other Ambulatory Visit: Payer: Self-pay | Admitting: Family Medicine

## 2022-02-14 ENCOUNTER — Ambulatory Visit: Payer: Medicare HMO | Admitting: Family Medicine

## 2022-03-04 ENCOUNTER — Encounter: Payer: Self-pay | Admitting: Family Medicine

## 2022-03-04 ENCOUNTER — Ambulatory Visit (INDEPENDENT_AMBULATORY_CARE_PROVIDER_SITE_OTHER): Payer: Medicare PPO | Admitting: Family Medicine

## 2022-03-04 VITALS — BP 130/78 | HR 73 | Temp 98.7°F | Ht 61.0 in | Wt 182.4 lb

## 2022-03-04 DIAGNOSIS — R42 Dizziness and giddiness: Secondary | ICD-10-CM

## 2022-03-04 DIAGNOSIS — E559 Vitamin D deficiency, unspecified: Secondary | ICD-10-CM

## 2022-03-04 DIAGNOSIS — K219 Gastro-esophageal reflux disease without esophagitis: Secondary | ICD-10-CM | POA: Diagnosis not present

## 2022-03-04 DIAGNOSIS — J40 Bronchitis, not specified as acute or chronic: Secondary | ICD-10-CM | POA: Insufficient documentation

## 2022-03-04 DIAGNOSIS — M810 Age-related osteoporosis without current pathological fracture: Secondary | ICD-10-CM

## 2022-03-04 LAB — VITAMIN D 25 HYDROXY (VIT D DEFICIENCY, FRACTURES): VITD: 36.5 ng/mL (ref 30.00–100.00)

## 2022-03-04 MED ORDER — PREDNISONE 20 MG PO TABS: 40.0000 mg | ORAL_TABLET | Freq: Every day | ORAL | 0 refills | Status: DC

## 2022-03-04 MED ORDER — AZITHROMYCIN 250 MG PO TABS: ORAL_TABLET | ORAL | 0 refills | Status: AC

## 2022-03-04 NOTE — Assessment & Plan Note (Signed)
Discussed medication management for this though the patient is hesitant to start on prescription medication.  We will check her vitamin D today.

## 2022-03-04 NOTE — Assessment & Plan Note (Signed)
Improved.  She will continue Astelin 2 sprays each nostril twice daily and Zyrtec over-the-counter.

## 2022-03-04 NOTE — Progress Notes (Signed)
Tommi Rumps, MD Phone: [phone]  Madison Craig is a 76 y.o. female who presents today for f/u.  Cough: This has been going on for 3 weeks.  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  She is coughing up yellow mucus.  She does feel congested and has postnasal drip.  No sore throat or fever.  No taste or smell disturbances.  No shortness of breath.  She has been using her allergy medicines though that has not been helping with her symptoms.  Dizziness: Patient has this improved with the Astelin and Zyrtec.  GERD: She is on omeprazole.  No reflux.  No blood in her stool.  No dysphagia.  Social History   Tobacco Use  Smoking Status Former   Packs/day: 2.00   Years: 16.00   Total pack years: 32.00   Types: Cigarettes   Quit date: 09/28/1981   Years since quitting: 40.4  Smokeless Tobacco Never    Current Outpatient Medications on File Prior to Visit  Medication Sig Dispense Refill   Acetaminophen (TYLENOL PO) Take by mouth as needed.     albuterol (VENTOLIN HFA) 108 (90 Base) MCG/ACT inhaler Inhale 2 puffs into the lungs every 4 (four) hours as needed for wheezing or shortness of breath. 18 g 0   azelastine (ASTELIN) 0.1 % nasal spray Place 2 sprays into both nostrils 2 (two) times daily. Use in each nostril as directed 30 mL 12   diclofenac (VOLTAREN) 75 MG EC tablet Take 1 tablet by mouth twice daily as needed 60 tablet 0   fluticasone (FLONASE) 50 MCG/ACT nasal spray Place 2 sprays into both nostrils daily. 16 g 6   meclizine (ANTIVERT) 25 MG tablet Take 1 tablet (25 mg total) by mouth 3 (three) times daily as needed for dizziness. 30 tablet 0   omeprazole (PRILOSEC) 40 MG capsule Take 1 capsule (40 mg total) by mouth daily. 90 capsule 3   Vitamin D, Ergocalciferol, (DRISDOL) 1.25 MG (50000 UNIT) CAPS capsule Take 1 capsule (50,000 Units total) by mouth every 7 (seven) days. 8 capsule 0   No current  facility-administered medications on file prior to visit.     ROS see history of present illness  Objective  Physical Exam Vitals:   03/04/22 0808 03/04/22 0819  BP: (!) 140/70 130/78  Pulse: 73   Temp: 98.7 F (37.1 C)   SpO2: 96%     BP Readings from Last 3 Encounters:  03/04/22 130/78  10/09/21 120/68  09/20/21 130/70   Wt Readings from Last 3 Encounters:  03/04/22 182 lb 6.4 oz (82.7 kg)  12/30/21 184 lb (83.5 kg)  10/09/21 184 lb 4 oz (83.6 kg)    Physical Exam Constitutional:      General: She is not in acute distress.    Appearance: She is not diaphoretic.  HENT:     Right Ear: Tympanic membrane normal.     Left Ear: Tympanic membrane normal.  Cardiovascular:     Rate and Rhythm: Normal rate and regular rhythm.     Heart sounds: Normal heart sounds.  Pulmonary:     Effort: Pulmonary effort is normal.     Breath sounds: Normal breath sounds.  Skin:    General: Skin is warm and dry.  Neurological:     Mental Status: She is alert.      Assessment/Plan: Please see individual problem list.  Problem List Items Addressed This Visit  Dizziness (Chronic)    Improved.  She will continue Astelin 2 sprays each nostril twice daily and Zyrtec over-the-counter.      GERD (gastroesophageal reflux disease) (Chronic)    Patient will continue omeprazole 40 mg daily.      Osteoporosis (Chronic)    Discussed medication management for this though the patient is hesitant to start on prescription medication.  We will check her vitamin D today.      Bronchitis - Primary    Symptoms are consistent with bronchitis.  She has a benign exam today.  We will treat with azithromycin and prednisone.  If not improving she will let me know.      Relevant Medications   predniSONE (DELTASONE) 20 MG tablet   azithromycin (ZITHROMAX) 250 MG tablet   Other Visit Diagnoses     Vitamin D deficiency          Return in about 6 months (around 09/03/2022).   Tommi Rumps, MD [place]

## 2022-03-04 NOTE — Progress Notes (Signed)
fol

## 2022-03-04 NOTE — Assessment & Plan Note (Signed)
Symptoms are consistent with bronchitis.  She has a benign exam today.  We will treat with azithromycin and prednisone.  If not improving she will let me know.

## 2022-03-04 NOTE — Patient Instructions (Signed)
Nice to see you. We will see how you do on the azithromycin and the prednisone. If you are not improving with this please let me know.

## 2022-03-04 NOTE — Assessment & Plan Note (Signed)
Patient will continue omeprazole 40 mg daily.

## 2022-03-20 ENCOUNTER — Other Ambulatory Visit: Payer: Self-pay | Admitting: Family

## 2022-03-27 ENCOUNTER — Telehealth: Payer: Self-pay

## 2022-03-27 ENCOUNTER — Other Ambulatory Visit: Payer: Self-pay | Admitting: Family Medicine

## 2022-03-27 NOTE — Telephone Encounter (Signed)
Done by Dr. Volanda Napoleon.  Paras Kreider,cma

## 2022-03-27 NOTE — Telephone Encounter (Signed)
Patient states she needs a refill for her diclofenac (VOLTAREN) 75 MG EC tablet.  Patient states she is out of this medication and she has not had any yesterday or today and she needs this as soon as possible.  *Patient states her preferred pharmacy is [facility] on [address].

## 2022-05-22 ENCOUNTER — Other Ambulatory Visit: Payer: Self-pay | Admitting: Family Medicine

## 2022-07-17 ENCOUNTER — Other Ambulatory Visit: Payer: Self-pay | Admitting: Family Medicine

## 2022-08-27 ENCOUNTER — Other Ambulatory Visit: Payer: Self-pay | Admitting: Family Medicine

## 2022-08-27 DIAGNOSIS — Z1231 Encounter for screening mammogram for malignant neoplasm of breast: Secondary | ICD-10-CM

## 2022-09-03 ENCOUNTER — Ambulatory Visit (INDEPENDENT_AMBULATORY_CARE_PROVIDER_SITE_OTHER): Payer: Medicare PPO | Admitting: Family Medicine

## 2022-09-03 ENCOUNTER — Encounter: Payer: Self-pay | Admitting: Family Medicine

## 2022-09-03 VITALS — BP 136/84 | HR 65 | Temp 97.7°F | Resp 17 | Ht 61.0 in | Wt 181.5 lb

## 2022-09-03 DIAGNOSIS — M1990 Unspecified osteoarthritis, unspecified site: Secondary | ICD-10-CM

## 2022-09-03 DIAGNOSIS — R7303 Prediabetes: Secondary | ICD-10-CM | POA: Diagnosis not present

## 2022-09-03 DIAGNOSIS — E7800 Pure hypercholesterolemia, unspecified: Secondary | ICD-10-CM | POA: Diagnosis not present

## 2022-09-03 DIAGNOSIS — J449 Chronic obstructive pulmonary disease, unspecified: Secondary | ICD-10-CM

## 2022-09-03 DIAGNOSIS — K219 Gastro-esophageal reflux disease without esophagitis: Secondary | ICD-10-CM

## 2022-09-03 DIAGNOSIS — R42 Dizziness and giddiness: Secondary | ICD-10-CM | POA: Diagnosis not present

## 2022-09-03 DIAGNOSIS — D500 Iron deficiency anemia secondary to blood loss (chronic): Secondary | ICD-10-CM | POA: Diagnosis not present

## 2022-09-03 DIAGNOSIS — N819 Female genital prolapse, unspecified: Secondary | ICD-10-CM | POA: Insufficient documentation

## 2022-09-03 DIAGNOSIS — M810 Age-related osteoporosis without current pathological fracture: Secondary | ICD-10-CM

## 2022-09-03 DIAGNOSIS — E559 Vitamin D deficiency, unspecified: Secondary | ICD-10-CM

## 2022-09-03 DIAGNOSIS — N8110 Cystocele, unspecified: Secondary | ICD-10-CM

## 2022-09-03 LAB — CBC WITH DIFFERENTIAL/PLATELET
Basophils Absolute: 0 10*3/uL (ref 0.0–0.1)
Basophils Relative: 0.5 % (ref 0.0–3.0)
Eosinophils Absolute: 0.2 10*3/uL (ref 0.0–0.7)
Eosinophils Relative: 3.2 % (ref 0.0–5.0)
HCT: 35.8 % — ABNORMAL LOW (ref 36.0–46.0)
Hemoglobin: 11.9 g/dL — ABNORMAL LOW (ref 12.0–15.0)
Lymphocytes Relative: 32.9 % (ref 12.0–46.0)
Lymphs Abs: 1.7 10*3/uL (ref 0.7–4.0)
MCHC: 33.3 g/dL (ref 30.0–36.0)
MCV: 89.3 fl (ref 78.0–100.0)
Monocytes Absolute: 0.4 10*3/uL (ref 0.1–1.0)
Monocytes Relative: 7.1 % (ref 3.0–12.0)
Neutro Abs: 2.9 10*3/uL (ref 1.4–7.7)
Neutrophils Relative %: 56.3 % (ref 43.0–77.0)
Platelets: 220 10*3/uL (ref 150.0–400.0)
RBC: 4.01 Mil/uL (ref 3.87–5.11)
RDW: 14 % (ref 11.5–15.5)
WBC: 5.1 10*3/uL (ref 4.0–10.5)

## 2022-09-03 LAB — HEMOGLOBIN A1C: Hgb A1c MFr Bld: 5.7 % (ref 4.6–6.5)

## 2022-09-03 LAB — COMPREHENSIVE METABOLIC PANEL
ALT: 17 U/L (ref 0–35)
AST: 18 U/L (ref 0–37)
Albumin: 4.2 g/dL (ref 3.5–5.2)
Alkaline Phosphatase: 69 U/L (ref 39–117)
BUN: 24 mg/dL — ABNORMAL HIGH (ref 6–23)
CO2: 28 mEq/L (ref 19–32)
Calcium: 9.4 mg/dL (ref 8.4–10.5)
Chloride: 102 mEq/L (ref 96–112)
Creatinine, Ser: 1.07 mg/dL (ref 0.40–1.20)
GFR: 50.95 mL/min — ABNORMAL LOW (ref >60.00)
Glucose, Bld: 95 mg/dL (ref 70–99)
Potassium: 4.5 mEq/L (ref 3.5–5.1)
Sodium: 137 mEq/L (ref 135–145)
Total Bilirubin: 0.6 mg/dL (ref 0.2–1.2)
Total Protein: 7.1 g/dL (ref 6.0–8.3)

## 2022-09-03 LAB — IBC + FERRITIN
Ferritin: 42.4 ng/mL (ref 10.0–291.0)
Iron: 67 ug/dL (ref 42–145)
Saturation Ratios: 20.3 % (ref 20.0–50.0)
TIBC: 330.4 ug/dL (ref 250.0–450.0)
Transferrin: 236 mg/dL (ref 212.0–360.0)

## 2022-09-03 LAB — LIPID PANEL
Cholesterol: 173 mg/dL (ref 0–200)
HDL: 52.2 mg/dL (ref >39.00)
LDL Cholesterol: 102 mg/dL — ABNORMAL HIGH (ref 0–99)
NonHDL: 121.28
Total CHOL/HDL Ratio: 3
Triglycerides: 96 mg/dL (ref 0.0–149.0)
VLDL: 19.2 mg/dL (ref 0.0–40.0)

## 2022-09-03 LAB — VITAMIN D 25 HYDROXY (VIT D DEFICIENCY, FRACTURES): VITD: 27.65 ng/mL — ABNORMAL LOW (ref 30.00–100.00)

## 2022-09-03 MED ORDER — OMEPRAZOLE 40 MG PO CPDR: 40.0000 mg | DELAYED_RELEASE_CAPSULE | Freq: Every day | ORAL | 3 refills | Status: DC

## 2022-09-03 MED ORDER — DICLOFENAC SODIUM 75 MG PO TBEC: 75.0000 mg | DELAYED_RELEASE_TABLET | Freq: Two times a day (BID) | ORAL | 1 refills | Status: DC | PRN

## 2022-09-03 NOTE — Progress Notes (Signed)
Marikay Alar, MD Phone: [phone]  Madison Craig is a 77 y.o. female who presents today for f/u.  Elevated BP: no history of hypertension. No CP, SOB, or LE edema.   Obstructive airway disease: Patient notes no wheezing or cough.  Allergic rhinitis: Patient notes the dizziness she was having previously from eustachian tube dysfunction has resolved since going on Zyrtec and Astelin.  Osteoarthritis: Diclofenac once daily helps with her pain.  She has reduced her work hours as well so she can take care of herself better.  Vaginal prolapse: Patient reports a long history of this.  She denies pain and bowel movement issues related to this.  She does report urinary urgency.  Social History   Tobacco Use  Smoking Status Former   Packs/day: 2.00   Years: 16.00   Additional pack years: 0.00   Total pack years: 32.00   Types: Cigarettes   Quit date: 09/28/1981   Years since quitting: 40.9  Smokeless Tobacco Never    Current Outpatient Medications on File Prior to Visit  Medication Sig Dispense Refill   Acetaminophen (TYLENOL PO) Take by mouth as needed.     albuterol (VENTOLIN HFA) 108 (90 Base) MCG/ACT inhaler Inhale 2 puffs into the lungs every 4 (four) hours as needed for wheezing or shortness of breath. 18 g 0   azelastine (ASTELIN) 0.1 % nasal spray Place 2 sprays into both nostrils 2 (two) times daily. Use in each nostril as directed 30 mL 12   fluticasone (FLONASE) 50 MCG/ACT nasal spray Place 2 sprays into both nostrils daily. 16 g 6   Vitamin D, Ergocalciferol, (DRISDOL) 1.25 MG (50000 UNIT) CAPS capsule Take 1 capsule (50,000 Units total) by mouth every 7 (seven) days. 8 capsule 0   No current facility-administered medications on file prior to visit.     ROS see history of present illness  Objective  Physical Exam Vitals:   09/03/22 0808 09/03/22 0821  BP: (!) 150/80 136/84  Pulse: 65   Resp: 17   Temp: 97.7 F (36.5 C)   SpO2: 97%     BP  Readings from Last 3 Encounters:  09/03/22 136/84  03/04/22 130/78  10/09/21 120/68   Wt Readings from Last 3 Encounters:  09/03/22 181 lb 8 oz (82.3 kg)  03/04/22 182 lb 6.4 oz (82.7 kg)  12/30/21 184 lb (83.5 kg)    Physical Exam Constitutional:      General: She is not in acute distress.    Appearance: She is not diaphoretic.  Cardiovascular:     Rate and Rhythm: Normal rate and regular rhythm.     Heart sounds: Normal heart sounds.  Pulmonary:     Effort: Pulmonary effort is normal.     Breath sounds: Normal breath sounds.  Musculoskeletal:     Right lower leg: No edema.     Left lower leg: No edema.  Skin:    General: Skin is warm and dry.  Neurological:     Mental Status: She is alert.      Assessment/Plan: Please see individual problem list.  Prediabetes Assessment & Plan: Check A1c.  Orders: -     CBC with Differential/Platelet -     Hemoglobin A1c  Osteoporosis without current pathological fracture, unspecified osteoporosis type -     VITAMIN D 25 Hydroxy (Vit-D Deficiency, Fractures)  Vitamin D deficiency -     VITAMIN D 25 Hydroxy (Vit-D Deficiency, Fractures)  Pure hypercholesterolemia Assessment & Plan: Check lipid panel.  Orders: -  Comprehensive metabolic panel -     Lipid panel  Iron deficiency anemia due to chronic blood loss Assessment & Plan: Chronic issue.  Recheck labs.  Continue PPI.  Orders: -     IBC + Ferritin -     Hemoglobin A1c  OAD (obstructive airway disease) (HCC) Assessment & Plan: Chronic issue.  Generally asymptomatic.  Continue to monitor.   Gastroesophageal reflux disease, unspecified whether esophagitis present Assessment & Plan: Chronic issue.  Continue omeprazole 40 mg daily.  This is providing protection given that she is on long-term NSAIDs as well.  Orders: -     Omeprazole; Take 1 capsule (40 mg total) by mouth daily.  Dispense: 90 capsule; Refill: 3  Osteoarthritis, unspecified osteoarthritis  type, unspecified site Assessment & Plan: Chronic issue.  She can continue diclofenac once daily.  Will check kidney function today.  Orders: -     Diclofenac Sodium; Take 1 tablet (75 mg total) by mouth 2 (two) times daily as needed.  Dispense: 60 tablet; Refill: 1  Dizziness Assessment & Plan: Resolved.  She can continue Astelin 2 sprays each nostril twice daily and Zyrtec.   Vaginal prolapse Assessment & Plan: Refer to gynecology.  Orders: -     Ambulatory referral to Gynecology   Return in about 1 month (around 10/03/2022) for Blood pressure follow-up with PCP.   Marikay Alar, MD [facility]

## 2022-09-03 NOTE — Assessment & Plan Note (Signed)
Chronic issue.  Recheck labs.  Continue PPI.

## 2022-09-03 NOTE — Assessment & Plan Note (Signed)
Refer to gynecology. 

## 2022-09-03 NOTE — Assessment & Plan Note (Signed)
Chronic issue.  Continue omeprazole 40 mg daily.  This is providing protection given that she is on long-term NSAIDs as well.

## 2022-09-03 NOTE — Assessment & Plan Note (Signed)
Check lipid panel  

## 2022-09-03 NOTE — Assessment & Plan Note (Signed)
Resolved.  She can continue Astelin 2 sprays each nostril twice daily and Zyrtec.

## 2022-09-03 NOTE — Assessment & Plan Note (Signed)
Chronic issue.  She can continue diclofenac once daily.  Will check kidney function today.

## 2022-09-03 NOTE — Patient Instructions (Signed)
Nice to see you. Gynecology should contact you to schedule an appointment.  If you do not hear from them within 2 weeks please let us know.

## 2022-09-03 NOTE — Assessment & Plan Note (Signed)
Chronic issue.  Generally asymptomatic.  Continue to monitor.

## 2022-09-03 NOTE — Assessment & Plan Note (Signed)
Check A1c. 

## 2022-09-04 ENCOUNTER — Telehealth: Payer: Self-pay

## 2022-09-04 NOTE — Telephone Encounter (Signed)
Left message to call the office back regarding lab results  

## 2022-09-04 NOTE — Telephone Encounter (Signed)
-----   Message from Glori Luis, MD sent at 09/04/2022 11:41 AM EDT ----- Please let the patient know her kidney function is stable.  Her blood counts are stable.  Her vitamin D is slightly low.  Is she currently taking a vitamin D supplement?  Her cholesterol is adequately controlled.

## 2022-09-23 ENCOUNTER — Ambulatory Visit: Payer: Medicare PPO | Admitting: Obstetrics and Gynecology

## 2022-09-23 ENCOUNTER — Encounter: Payer: Self-pay | Admitting: Obstetrics and Gynecology

## 2022-09-23 ENCOUNTER — Encounter: Payer: Self-pay | Admitting: Oncology

## 2022-09-23 VITALS — BP 129/77 | HR 73 | Ht 61.0 in | Wt 180.6 lb

## 2022-09-23 DIAGNOSIS — N8110 Cystocele, unspecified: Secondary | ICD-10-CM | POA: Diagnosis not present

## 2022-09-23 DIAGNOSIS — Z7689 Persons encountering health services in other specified circumstances: Secondary | ICD-10-CM | POA: Diagnosis not present

## 2022-09-23 DIAGNOSIS — H2511 Age-related nuclear cataract, right eye: Secondary | ICD-10-CM | POA: Diagnosis not present

## 2022-09-23 DIAGNOSIS — Z961 Presence of intraocular lens: Secondary | ICD-10-CM | POA: Diagnosis not present

## 2022-09-23 DIAGNOSIS — Z0100 Encounter for examination of eyes and vision without abnormal findings: Secondary | ICD-10-CM | POA: Diagnosis not present

## 2022-09-23 DIAGNOSIS — H353221 Exudative age-related macular degeneration, left eye, with active choroidal neovascularization: Secondary | ICD-10-CM | POA: Diagnosis not present

## 2022-09-23 NOTE — Progress Notes (Signed)
HPI:      [name] is a 77 y.o. G3P3 who LMP was No LMP recorded. Patient is postmenopausal.  Subjective:   She presents today complaining of something bulging out of the vagina.  She says has been there many years but has become worse recently.  She reports no urinary incontinence or trouble with bowel movements.  She does say that it is uncomfortable and gets in the way sometimes. She continues to work at a daycare.  She does not want to have a significant time off of work. She has a history of 3 previous vaginal births.    Hx: The following portions of the patient's history were reviewed and updated as appropriate:             She  has a past medical history of Anemia, Arthritis, Basal cell carcinoma, Benign neoplasm of ascending colon, Dyspnea (09/28/2013), GERD (gastroesophageal reflux disease), Heart murmur, Heme + stool, Macular degeneration (01/11/2014), Multiple thyroid nodules, Osteoporosis (09/18/2014), Reflux esophagitis, and SLEEP APNEA (03/22/2009). She does not have any pertinent problems on file. She  has a past surgical history that includes Breast surgery (1988); Colonoscopy with propofol (N/A, 04/24/2017); Esophagogastroduodenoscopy (egd) with propofol (N/A, 04/24/2017); Cardiac catheterization (2006); eye; and Endovenous ablation saphenous vein w/ laser (Right, 04/17/2021). Her family history includes Alcohol abuse in her sister; Aneurysm in her sister; Arrhythmia in her brother; Atrial fibrillation in her brother, mother, and sister; Dementia in her mother; Diabetes (age of onset: 31) in her son; Heart attack in her father and mother; Heart disease (age of onset: 29) in her father; Hodgkin's lymphoma in her son; Hyperlipidemia in her father; Hypertension in her father and mother; Stroke in her mother; Stroke (age of onset: 38) in her sister; Sudden death in her father; Valvular heart disease in her sister. She  reports that she quit smoking about 41 years ago. Her  smoking use included cigarettes. She has a 32.00 pack-year smoking history. She has never used smokeless tobacco. She reports that she does not drink alcohol and does not use drugs. She has a current medication list which includes the following prescription(s): acetaminophen, albuterol, azelastine, diclofenac, fluticasone, omeprazole, and vitamin d (ergocalciferol). She has No Known Allergies.       Review of Systems:  Review of Systems  Constitutional: Denied constitutional symptoms, night sweats, recent illness, fatigue, fever, insomnia and weight loss.  Eyes: Denied eye symptoms, eye pain, photophobia, vision change and visual disturbance.  Ears/Nose/Throat/Neck: Denied ear, nose, throat or neck symptoms, hearing loss, nasal discharge, sinus congestion and sore throat.  Cardiovascular: Denied cardiovascular symptoms, arrhythmia, chest pain/pressure, edema, exercise intolerance, orthopnea and palpitations.  Respiratory: Denied pulmonary symptoms, asthma, pleuritic pain, productive sputum, cough, dyspnea and wheezing.  Gastrointestinal: Denied, gastro-esophageal reflux, melena, nausea and vomiting.  Genitourinary: See HPI for additional information.  Musculoskeletal: Denied musculoskeletal symptoms, stiffness, swelling, muscle weakness and myalgia.  Dermatologic: Denied dermatology symptoms, rash and scar.  Neurologic: Denied neurology symptoms, dizziness, headache, neck pain and syncope.  Psychiatric: Denied psychiatric symptoms, anxiety and depression.  Endocrine: Denied endocrine symptoms including hot flashes and night sweats.   Meds:   Current Outpatient Medications on File Prior to Visit  Medication Sig Dispense Refill   Acetaminophen (TYLENOL PO) Take by mouth as needed.     albuterol (VENTOLIN HFA) 108 (90 Base) MCG/ACT inhaler Inhale 2 puffs into the lungs every 4 (four) hours as needed for wheezing or shortness of breath. 18 g 0   azelastine (ASTELIN)  0.1 % nasal spray Place 2  sprays into both nostrils 2 (two) times daily. Use in each nostril as directed 30 mL 12   diclofenac (VOLTAREN) 75 MG EC tablet Take 1 tablet (75 mg total) by mouth 2 (two) times daily as needed. 60 tablet 1   fluticasone (FLONASE) 50 MCG/ACT nasal spray Place 2 sprays into both nostrils daily. 16 g 6   omeprazole (PRILOSEC) 40 MG capsule Take 1 capsule (40 mg total) by mouth daily. 90 capsule 3   Vitamin D, Ergocalciferol, (DRISDOL) 1.25 MG (50000 UNIT) CAPS capsule Take 1 capsule (50,000 Units total) by mouth every 7 (seven) days. 8 capsule 0   No current facility-administered medications on file prior to visit.      Objective:     Vitals:   09/23/22 1411  BP: 129/77  Pulse: 73   Filed Weights   09/23/22 1411  Weight: 180 lb 9.6 oz (81.9 kg)              Patient declines examination today-- deferred to pessary fitting          Assessment:    G3P3 Patient Active Problem List   Diagnosis Date Noted   Vaginal prolapse 09/03/2022   Syncope 09/20/2021   Chronic venous insufficiency 11/06/2020   Varicose veins of both lower extremities 10/05/2020   Right leg injury 10/05/2020   Polyarthralgia 04/14/2019   Primary osteoarthritis involving multiple joints 04/14/2019   Dizziness 02/02/2018   Palpitations 11/11/2017   Rosacea 11/11/2017   Murmur 11/11/2017   Allergic rhinitis 08/11/2017   Diverticulosis of large intestine without diverticulitis    Hyperlipidemia 02/05/2017   Prediabetes 02/05/2017   Osteoporosis 09/18/2014   Iron deficiency anemia due to chronic blood loss 01/11/2014   Macular degeneration 01/11/2014   OAD (obstructive airway disease) (HCC) 09/28/2013   GERD (gastroesophageal reflux disease) 09/28/2013   Hiatal hernia 03/22/2009   Osteoarthritis 03/22/2009   Sleep apnea 03/22/2009   Multinodular goiter 03/22/2009     1. Establishing care with new doctor, encounter for   2. Vaginal prolapse        Plan:            1.  We have discussed  surgery versus pessary.  Types of prolapse discussed in detail.  Patient is not interested in surgery at this time and believes pessary sounds like a better idea for her.  Should she fail pessary she is considering future surgery.  She will schedule an appointment for pessary fitting in the near future. Orders No orders of the defined types were placed in this encounter.   No orders of the defined types were placed in this encounter.     F/U  Return in about 3 weeks (around 10/14/2022). I spent 31 minutes involved in the care of this patient preparing to see the patient by obtaining and reviewing her medical history (including labs, imaging tests and prior procedures), documenting clinical information in the electronic health record (EHR), counseling and coordinating care plans, writing and sending prescriptions, ordering tests or procedures and in direct communicating with the patient and medical staff discussing pertinent items from her history and physical exam.  Elonda Husky, M.D. 09/23/2022 2:30 PM

## 2022-09-23 NOTE — Progress Notes (Signed)
Patient presents today to discuss vaginal prolapse. She states this has been an ongoing issue for her for at least a year, states no pain only feeling a bulge. Reports no difficulty using the bathroom but does have urge incontinence, mainly in the evening.

## 2022-09-29 ENCOUNTER — Ambulatory Visit
Admission: RE | Admit: 2022-09-29 | Discharge: 2022-09-29 | Disposition: A | Discharge status: Home / Self Care | Payer: Medicare PPO | Source: Ambulatory Visit | Attending: Family Medicine | Admitting: Family Medicine

## 2022-09-29 DIAGNOSIS — Z1231 Encounter for screening mammogram for malignant neoplasm of breast: Secondary | ICD-10-CM | POA: Insufficient documentation

## 2022-09-30 DIAGNOSIS — H353221 Exudative age-related macular degeneration, left eye, with active choroidal neovascularization: Secondary | ICD-10-CM | POA: Diagnosis not present

## 2022-09-30 DIAGNOSIS — H2511 Age-related nuclear cataract, right eye: Secondary | ICD-10-CM | POA: Diagnosis not present

## 2022-09-30 DIAGNOSIS — Z961 Presence of intraocular lens: Secondary | ICD-10-CM | POA: Diagnosis not present

## 2022-10-02 ENCOUNTER — Encounter: Payer: Self-pay | Admitting: Family Medicine

## 2022-10-02 ENCOUNTER — Ambulatory Visit: Payer: Medicare PPO | Admitting: Family Medicine

## 2022-10-02 VITALS — BP 122/80 | HR 65 | Temp 98.6°F | Ht 61.0 in | Wt 177.4 lb

## 2022-10-02 DIAGNOSIS — H3530 Unspecified macular degeneration: Secondary | ICD-10-CM | POA: Diagnosis not present

## 2022-10-02 DIAGNOSIS — J309 Allergic rhinitis, unspecified: Secondary | ICD-10-CM

## 2022-10-02 DIAGNOSIS — K219 Gastro-esophageal reflux disease without esophagitis: Secondary | ICD-10-CM

## 2022-10-02 DIAGNOSIS — R030 Elevated blood-pressure reading, without diagnosis of hypertension: Secondary | ICD-10-CM

## 2022-10-02 DIAGNOSIS — E559 Vitamin D deficiency, unspecified: Secondary | ICD-10-CM | POA: Diagnosis not present

## 2022-10-02 DIAGNOSIS — M1990 Unspecified osteoarthritis, unspecified site: Secondary | ICD-10-CM

## 2022-10-02 LAB — VITAMIN D 25 HYDROXY (VIT D DEFICIENCY, FRACTURES): VITD: 21.43 ng/mL — ABNORMAL LOW (ref 30.00–100.00)

## 2022-10-02 MED ORDER — DICLOFENAC SODIUM 75 MG PO TBEC: 75.0000 mg | DELAYED_RELEASE_TABLET | Freq: Two times a day (BID) | ORAL | 1 refills | Status: DC | PRN

## 2022-10-02 MED ORDER — OMEPRAZOLE 40 MG PO CPDR: 40.0000 mg | DELAYED_RELEASE_CAPSULE | Freq: Every day | ORAL | 3 refills | Status: DC

## 2022-10-02 NOTE — Assessment & Plan Note (Signed)
Chronic issue.  I encouraged her to continue Flonase 2 sprays each nostril daily.  She is going to progressively back off on the Astelin and see how she does.  I advised her to check with her ophthalmologist to see if the Astelin is an issue with the injection she is getting.

## 2022-10-02 NOTE — Assessment & Plan Note (Signed)
Elevated at last visit though improved today.  We will continue to monitor.

## 2022-10-02 NOTE — Progress Notes (Signed)
Marikay Alar, MD Phone: [phone]  Madison Craig is a 77 y.o. female who presents today for f/u.  Vitamin D deficiency: patient is taking vitamin D 1000 IU daily. Due for recheck.   Elevated BP: not checking at home though has been ok at other offices. No CP, SOB, or edema.   Macular degeneration: Patient notes she is getting injections for this.  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  She is legally blind in her right eye.  She wonders if the Astelin nasal spray will interfere with her injections.  She notes the Astelin has helped significantly with her allergies.  She plans to back off on this and see if she is able to tolerate without it.  Social History   Tobacco Use  Smoking Status Former   Packs/day: 2.00   Years: 16.00   Additional pack years: 0.00   Total pack years: 32.00   Types: Cigarettes   Quit date: 09/28/1981   Years since quitting: 41.0  Smokeless Tobacco Never    Current Outpatient Medications on File Prior to Visit  Medication Sig Dispense Refill   Acetaminophen (TYLENOL PO) Take by mouth as needed.     albuterol (VENTOLIN HFA) 108 (90 Base) MCG/ACT inhaler Inhale 2 puffs into the lungs every 4 (four) hours as needed for wheezing or shortness of breath. 18 g 0   azelastine (ASTELIN) 0.1 % nasal spray Place 2 sprays into both nostrils 2 (two) times daily. Use in each nostril as directed 30 mL 12   fluticasone (FLONASE) 50 MCG/ACT nasal spray Place 2 sprays into both nostrils daily. 16 g 6   Vitamin D, Ergocalciferol, (DRISDOL) 1.25 MG (50000 UNIT) CAPS capsule Take 1 capsule (50,000 Units total) by mouth every 7 (seven) days. 8 capsule 0   No current facility-administered medications on file prior to visit.     ROS see history of present illness  Objective  Physical Exam Vitals:   10/02/22 1007  BP: 122/80  Pulse: 65  Temp: 98.6 F (37 C)  SpO2: 98%    BP Readings from Last 3 Encounters:  10/02/22 122/80   09/23/22 129/77  09/03/22 136/84   Wt Readings from Last 3 Encounters:  10/02/22 177 lb 6.4 oz (80.5 kg)  09/23/22 180 lb 9.6 oz (81.9 kg)  09/03/22 181 lb 8 oz (82.3 kg)    Physical Exam Constitutional:      General: She is not in acute distress.    Appearance: She is not diaphoretic.  Cardiovascular:     Rate and Rhythm: Normal rate and regular rhythm.     Heart sounds: Normal heart sounds.  Pulmonary:     Effort: Pulmonary effort is normal.     Breath sounds: Normal breath sounds.  Skin:    General: Skin is warm and dry.  Neurological:     Mental Status: She is alert.      Assessment/Plan: Please see individual problem list.  Allergic rhinitis, unspecified seasonality, unspecified trigger Assessment & Plan: Chronic issue.  I encouraged her to continue Flonase 2 sprays each nostril daily.  She is going to progressively back off on the Astelin and see how she does.  I advised her to check with her ophthalmologist to see if the Astelin is an issue with the injection she is getting.   Osteoarthritis, unspecified osteoarthritis type, unspecified site -     Diclofenac Sodium; Take 1 tablet (75 mg total) by mouth 2 (two) times daily as needed.  Dispense: 60 tablet; Refill: 1  Gastroesophageal reflux disease, unspecified whether esophagitis present -     Omeprazole; Take 1 capsule (40 mg total) by mouth daily.  Dispense: 90 capsule; Refill: 3  Vitamin D deficiency Assessment & Plan: Chronic issue.  Check vitamin D today.  Continue vitamin D 1000 international units daily over-the-counter.  Orders: -     VITAMIN D 25 Hydroxy (Vit-D Deficiency, Fractures)  Macular degeneration, unspecified laterality, unspecified type Assessment & Plan: Chronic issue.  She will continue to see ophthalmology.  I encouraged her to check with them regarding the use of Astelin.   Elevated blood pressure reading Assessment & Plan: Elevated at last visit though improved today.  We will  continue to monitor.      Return in about 6 months (around 04/04/2023).   Marikay Alar, MD [facility]

## 2022-10-02 NOTE — Assessment & Plan Note (Signed)
Chronic issue.  Check vitamin D today.  Continue vitamin D 1000 international units daily over-the-counter.

## 2022-10-02 NOTE — Assessment & Plan Note (Signed)
Chronic issue.  She will continue to see ophthalmology.  I encouraged her to check with them regarding the use of Astelin.

## 2022-10-15 ENCOUNTER — Encounter: Payer: Self-pay | Admitting: Obstetrics and Gynecology

## 2022-10-15 ENCOUNTER — Ambulatory Visit: Payer: Medicare PPO | Admitting: Obstetrics and Gynecology

## 2022-10-15 VITALS — BP 109/71 | HR 63 | Ht 61.0 in | Wt 177.2 lb

## 2022-10-15 DIAGNOSIS — N8110 Cystocele, unspecified: Secondary | ICD-10-CM

## 2022-10-15 DIAGNOSIS — Z4689 Encounter for fitting and adjustment of other specified devices: Secondary | ICD-10-CM | POA: Diagnosis not present

## 2022-10-15 NOTE — Progress Notes (Signed)
HPI:      [name] is a 77 y.o. G3P3 who LMP was No LMP recorded. Patient is postmenopausal.  Subjective:   She presents today for pessary fitting.  She has a very large fourth degree prolapse.  She declined surgery.    Hx: The following portions of the patient's history were reviewed and updated as appropriate:             She  has a past medical history of Anemia, Arthritis, Basal cell carcinoma, Benign neoplasm of ascending colon, Dyspnea (09/28/2013), GERD (gastroesophageal reflux disease), Heart murmur, Heme + stool, Macular degeneration (01/11/2014), Multiple thyroid nodules, Osteoporosis (09/18/2014), Reflux esophagitis, and SLEEP APNEA (03/22/2009). She does not have any pertinent problems on file. She  has a past surgical history that includes Breast surgery (1988); Colonoscopy with propofol (N/A, 04/24/2017); Esophagogastroduodenoscopy (egd) with propofol (N/A, 04/24/2017); Cardiac catheterization (2006); eye; and Endovenous ablation saphenous vein w/ laser (Right, 04/17/2021). Her family history includes Alcohol abuse in her sister; Aneurysm in her sister; Arrhythmia in her brother; Atrial fibrillation in her brother, mother, and sister; Dementia in her mother; Diabetes (age of onset: 47) in her son; Heart attack in her father and mother; Heart disease (age of onset: 12) in her father; Hodgkin's lymphoma in her son; Hyperlipidemia in her father; Hypertension in her father and mother; Stroke in her mother; Stroke (age of onset: 74) in her sister; Sudden death in her father; Valvular heart disease in her sister. She  reports that she quit smoking about 41 years ago. Her smoking use included cigarettes. She has a 32.00 pack-year smoking history. She has never used smokeless tobacco. She reports that she does not drink alcohol and does not use drugs. She has a current medication list which includes the following prescription(s): acetaminophen, albuterol, azelastine, diclofenac,  fluticasone, omeprazole, and vitamin d (ergocalciferol). She has No Known Allergies.       Review of Systems:  Review of Systems  Constitutional: Denied constitutional symptoms, night sweats, recent illness, fatigue, fever, insomnia and weight loss.  Eyes: Denied eye symptoms, eye pain, photophobia, vision change and visual disturbance.  Ears/Nose/Throat/Neck: Denied ear, nose, throat or neck symptoms, hearing loss, nasal discharge, sinus congestion and sore throat.  Cardiovascular: Denied cardiovascular symptoms, arrhythmia, chest pain/pressure, edema, exercise intolerance, orthopnea and palpitations.  Respiratory: Denied pulmonary symptoms, asthma, pleuritic pain, productive sputum, cough, dyspnea and wheezing.  Gastrointestinal: Denied, gastro-esophageal reflux, melena, nausea and vomiting.  Genitourinary: See HPI for additional information.  Musculoskeletal: Denied musculoskeletal symptoms, stiffness, swelling, muscle weakness and myalgia.  Dermatologic: Denied dermatology symptoms, rash and scar.  Neurologic: Denied neurology symptoms, dizziness, headache, neck pain and syncope.  Psychiatric: Denied psychiatric symptoms, anxiety and depression.  Endocrine: Denied endocrine symptoms including hot flashes and night sweats.   Meds:   Current Outpatient Medications on File Prior to Visit  Medication Sig Dispense Refill   Acetaminophen (TYLENOL PO) Take by mouth as needed.     albuterol (VENTOLIN HFA) 108 (90 Base) MCG/ACT inhaler Inhale 2 puffs into the lungs every 4 (four) hours as needed for wheezing or shortness of breath. 18 g 0   azelastine (ASTELIN) 0.1 % nasal spray Place 2 sprays into both nostrils 2 (two) times daily. Use in each nostril as directed 30 mL 12   diclofenac (VOLTAREN) 75 MG EC tablet Take 1 tablet (75 mg total) by mouth 2 (two) times daily as needed. 60 tablet 1   fluticasone (FLONASE) 50 MCG/ACT nasal spray Place 2 sprays  into both nostrils daily. 16 g 6    omeprazole (PRILOSEC) 40 MG capsule Take 1 capsule (40 mg total) by mouth daily. 90 capsule 3   Vitamin D, Ergocalciferol, (DRISDOL) 1.25 MG (50000 UNIT) CAPS capsule Take 1 capsule (50,000 Units total) by mouth every 7 (seven) days. 8 capsule 0   No current facility-administered medications on file prior to visit.      Objective:     Vitals:   10/15/22 0935  BP: 109/71  Pulse: 63   Filed Weights   10/15/22 0935  Weight: 177 lb 3.2 oz (80.4 kg)              Ring pessary placed after examination.  Patient did well with it but upon reexamination still had some cystocele prolapse.  Gellhorn pessary placed.  After moving around patient had no issues with pain from the pessary and no issue from expulsion.  Much better support.   (Size 3.25)   Pessary from our stock placed.          Assessment:    G3P3 Patient Active Problem List   Diagnosis Date Noted   Vitamin D deficiency 10/02/2022   Elevated blood pressure reading 10/02/2022   Vaginal prolapse 09/03/2022   Syncope 09/20/2021   Chronic venous insufficiency 11/06/2020   Varicose veins of both lower extremities 10/05/2020   Right leg injury 10/05/2020   Polyarthralgia 04/14/2019   Primary osteoarthritis involving multiple joints 04/14/2019   Palpitations 11/11/2017   Rosacea 11/11/2017   Murmur 11/11/2017   Allergic rhinitis 08/11/2017   Diverticulosis of large intestine without diverticulitis    Hyperlipidemia 02/05/2017   Prediabetes 02/05/2017   Osteoporosis 09/18/2014   Iron deficiency anemia due to chronic blood loss 01/11/2014   Macular degeneration 01/11/2014   OAD (obstructive airway disease) (HCC) 09/28/2013   GERD (gastroesophageal reflux disease) 09/28/2013   Hiatal hernia 03/22/2009   Osteoarthritis 03/22/2009   Sleep apnea 03/22/2009   Multinodular goiter 03/22/2009     1. Vaginal prolapse   2. Encounter for fitting and adjustment of pessary        Plan:            1.  Gellhorn pessary and  placed 3.25.  2.  Plan follow-up in 3 months. She is to contact us if she has any further issues or expulsion of pessary. Orders No orders of the defined types were placed in this encounter.   No orders of the defined types were placed in this encounter.     F/U  Return in about 3 months (around 01/15/2023). I spent 33 minutes involved in the care of this patient preparing to see the patient by obtaining and reviewing her medical history (including labs, imaging tests and prior procedures), documenting clinical information in the electronic health record (EHR), counseling and coordinating care plans, writing and sending prescriptions, ordering tests or procedures and in direct communicating with the patient and medical staff discussing pertinent items from her history and physical exam.  Elonda Husky, M.D. 10/15/2022 10:37 AM

## 2022-10-15 NOTE — Progress Notes (Signed)
Patient presents today for a pessary fitting. She states no additional questions or concerns today.

## 2022-11-04 DIAGNOSIS — H353221 Exudative age-related macular degeneration, left eye, with active choroidal neovascularization: Secondary | ICD-10-CM | POA: Diagnosis not present

## 2022-12-04 ENCOUNTER — Telehealth: Payer: Self-pay | Admitting: Family Medicine

## 2022-12-04 DIAGNOSIS — E559 Vitamin D deficiency, unspecified: Secondary | ICD-10-CM

## 2022-12-04 NOTE — Telephone Encounter (Signed)
Patient need lab orders.

## 2022-12-04 NOTE — Telephone Encounter (Signed)
Ordered

## 2022-12-09 ENCOUNTER — Other Ambulatory Visit (INDEPENDENT_AMBULATORY_CARE_PROVIDER_SITE_OTHER): Payer: Medicare PPO

## 2022-12-09 DIAGNOSIS — E559 Vitamin D deficiency, unspecified: Secondary | ICD-10-CM | POA: Diagnosis not present

## 2022-12-09 DIAGNOSIS — H353221 Exudative age-related macular degeneration, left eye, with active choroidal neovascularization: Secondary | ICD-10-CM | POA: Diagnosis not present

## 2022-12-09 LAB — VITAMIN D 25 HYDROXY (VIT D DEFICIENCY, FRACTURES): VITD: 42.97 ng/mL (ref 30.00–100.00)

## 2022-12-24 ENCOUNTER — Encounter (INDEPENDENT_AMBULATORY_CARE_PROVIDER_SITE_OTHER): Payer: Self-pay

## 2023-01-07 ENCOUNTER — Ambulatory Visit (INDEPENDENT_AMBULATORY_CARE_PROVIDER_SITE_OTHER): Payer: Medicare PPO | Admitting: *Deleted

## 2023-01-07 VITALS — Ht 61.0 in | Wt 178.0 lb

## 2023-01-07 DIAGNOSIS — Z0000 Encounter for general adult medical examination without abnormal findings: Secondary | ICD-10-CM

## 2023-01-07 NOTE — Patient Instructions (Signed)
Madison Craig , Thank you for taking time to come for your Medicare Wellness Visit. I appreciate your ongoing commitment to your health goals. Please review the following plan we discussed and let me know if I can assist you in the future.   Referrals/Orders/Follow-Ups/Clinician Recommendations: None  This is a list of the screening recommended for you and due dates:  Health Maintenance  Topic Date Due   Colon Cancer Screening  04/24/2020   COVID-19 Vaccine (4 - 2023-24 season) 01/24/2022   Flu Shot  12/25/2022   Mammogram  09/29/2023   Medicare Annual Wellness Visit  01/07/2024   DTaP/Tdap/Td vaccine (2 - Td or Tdap) 01/12/2024   Pneumonia Vaccine  Completed   DEXA scan (bone density measurement)  Completed   Hepatitis C Screening  Completed   HPV Vaccine  Aged Out   Zoster (Shingles) Vaccine  Discontinued    Advanced directives: (Declined) Advance directive discussed with you today. Even though you declined this today, please call our office should you change your mind, and we can give you the proper paperwork for you to fill out. Will pick paperwork up at next visit  Next Medicare Annual Wellness Visit scheduled for next year: Yes 01/12/24 @ 12:45  Preventive Care 65 Years and Older, Female Preventive care refers to lifestyle choices and visits with your health care provider that can promote health and wellness. What does preventive care include? A yearly physical exam. This is also called an annual well check. Dental exams once or twice a year. Routine eye exams. Ask your health care provider how often you should have your eyes checked. Personal lifestyle choices, including: Daily care of your teeth and gums. Regular physical activity. Eating a healthy diet. Avoiding tobacco and drug use. Limiting alcohol use. Practicing safe sex. Taking low-dose aspirin every day. Taking vitamin and mineral supplements as recommended by your health care provider. What happens during an  annual well check? The services and screenings done by your health care provider during your annual well check will depend on your age, overall health, lifestyle risk factors, and family history of disease. Counseling  Your health care provider may ask you questions about your: Alcohol use. Tobacco use. Drug use. Emotional well-being. Home and relationship well-being. Sexual activity. Eating habits. History of falls. Memory and ability to understand (cognition). Work and work Astronomer. Reproductive health. Screening  You may have the following tests or measurements: Height, weight, and BMI. Blood pressure. Lipid and cholesterol levels. These may be checked every 5 years, or more frequently if you are over 60 years old. Skin check. Lung cancer screening. You may have this screening every year starting at age 96 if you have a 30-pack-year history of smoking and currently smoke or have quit within the past 15 years. Fecal occult blood test (FOBT) of the stool. You may have this test every year starting at age 75. Flexible sigmoidoscopy or colonoscopy. You may have a sigmoidoscopy every 5 years or a colonoscopy every 10 years starting at age 60. Hepatitis C blood test. Hepatitis B blood test. Sexually transmitted disease (STD) testing. Diabetes screening. This is done by checking your blood sugar (glucose) after you have not eaten for a while (fasting). You may have this done every 1-3 years. Bone density scan. This is done to screen for osteoporosis. You may have this done starting at age 42. Mammogram. This may be done every 1-2 years. Talk to your health care provider about how often you should have regular mammograms.  Talk with your health care provider about your test results, treatment options, and if necessary, the need for more tests. Vaccines  Your health care provider may recommend certain vaccines, such as: Influenza vaccine. This is recommended every year. Tetanus,  diphtheria, and acellular pertussis (Tdap, Td) vaccine. You may need a Td booster every 10 years. Zoster vaccine. You may need this after age 49. Pneumococcal 13-valent conjugate (PCV13) vaccine. One dose is recommended after age 64. Pneumococcal polysaccharide (PPSV23) vaccine. One dose is recommended after age 42. Talk to your health care provider about which screenings and vaccines you need and how often you need them. This information is not intended to replace advice given to you by your health care provider. Make sure you discuss any questions you have with your health care provider. Document Released: 06/08/2015 Document Revised: 01/30/2016 Document Reviewed: 03/13/2015 Elsevier Interactive Patient Education  2017 [employer].  Fall Prevention in the Home Falls can cause injuries. They can happen to people of all ages. There are many things you can do to make your home safe and to help prevent falls. What can I do on the outside of my home? Regularly fix the edges of walkways and driveways and fix any cracks. Remove anything that might make you trip as you walk through a door, such as a raised step or threshold. Trim any bushes or trees on the path to your home. Use bright outdoor lighting. Clear any walking paths of anything that might make someone trip, such as rocks or tools. Regularly check to see if handrails are loose or broken. Make sure that both sides of any steps have handrails. Any raised decks and porches should have guardrails on the edges. Have any leaves, snow, or ice cleared regularly. Use sand or salt on walking paths during winter. Clean up any spills in your garage right away. This includes oil or grease spills. What can I do in the bathroom? Use night lights. Install grab bars by the toilet and in the tub and shower. Do not use towel bars as grab bars. Use non-skid mats or decals in the tub or shower. If you need to sit down in the shower, use a plastic,  non-slip stool. Keep the floor dry. Clean up any water that spills on the floor as soon as it happens. Remove soap buildup in the tub or shower regularly. Attach bath mats securely with double-sided non-slip rug tape. Do not have throw rugs and other things on the floor that can make you trip. What can I do in the bedroom? Use night lights. Make sure that you have a light by your bed that is easy to reach. Do not use any sheets or blankets that are too big for your bed. They should not hang down onto the floor. Have a firm chair that has side arms. You can use this for support while you get dressed. Do not have throw rugs and other things on the floor that can make you trip. What can I do in the kitchen? Clean up any spills right away. Avoid walking on wet floors. Keep items that you use a lot in easy-to-reach places. If you need to reach something above you, use a strong step stool that has a grab bar. Keep electrical cords out of the way. Do not use floor polish or wax that makes floors slippery. If you must use wax, use non-skid floor wax. Do not have throw rugs and other things on the floor that can make  you trip. What can I do with my stairs? Do not leave any items on the stairs. Make sure that there are handrails on both sides of the stairs and use them. Fix handrails that are broken or loose. Make sure that handrails are as long as the stairways. Check any carpeting to make sure that it is firmly attached to the stairs. Fix any carpet that is loose or worn. Avoid having throw rugs at the top or bottom of the stairs. If you do have throw rugs, attach them to the floor with carpet tape. Make sure that you have a light switch at the top of the stairs and the bottom of the stairs. If you do not have them, ask someone to add them for you. What else can I do to help prevent falls? Wear shoes that: Do not have high heels. Have rubber bottoms. Are comfortable and fit you well. Are closed  at the toe. Do not wear sandals. If you use a stepladder: Make sure that it is fully opened. Do not climb a closed stepladder. Make sure that both sides of the stepladder are locked into place. Ask someone to hold it for you, if possible. Clearly mark and make sure that you can see: Any grab bars or handrails. First and last steps. Where the edge of each step is. Use tools that help you move around (mobility aids) if they are needed. These include: Canes. Walkers. Scooters. Crutches. Turn on the lights when you go into a dark area. Replace any light bulbs as soon as they burn out. Set up your furniture so you have a clear path. Avoid moving your furniture around. If any of your floors are uneven, fix them. If there are any pets around you, be aware of where they are. Review your medicines with your doctor. Some medicines can make you feel dizzy. This can increase your chance of falling. Ask your doctor what other things that you can do to help prevent falls. This information is not intended to replace advice given to you by your health care provider. Make sure you discuss any questions you have with your health care provider. Document Released: 03/08/2009 Document Revised: 10/18/2015 Document Reviewed: 06/16/2014 Elsevier Interactive Patient Education  2017 [employer].

## 2023-01-07 NOTE — Progress Notes (Signed)
Subjective:   Madison Craig is a 77 y.o. female who presents for Medicare Annual (Subsequent) preventive examination.  Visit Complete: Virtual  I connected with  Madison Craig on 01/07/23 by a audio enabled telemedicine application and verified that I am speaking with the correct person using two identifiers.  Patient Location: Other:  at work  Provider Location: Office/Clinic  I discussed the limitations of evaluation and management by telemedicine. The patient expressed understanding and agreed to proceed.  Vital Signs: Unable to obtain new vitals due to this being a telehealth visit.   Review of Systems     Cardiac Risk Factors include: advanced age (>83men, >72 women);dyslipidemia;obesity (BMI >30kg/m2);Other (see comment), Risk factor comments: Palpitations     Objective:    Today's Vitals   01/07/23 1234 01/07/23 1235  Weight: 178 lb (80.7 kg)   Height: 5\' 1"  (1.549 m)   PainSc:  5    Body mass index is 33.63 kg/m.     01/07/2023   12:53 PM 12/30/2021    1:09 PM 01/15/2021   10:05 AM 11/19/2020    9:22 AM 10/02/2020    8:51 AM 09/02/2019   10:43 AM 08/27/2018    9:25 AM  Advanced Directives  Does Patient Have a Medical Advance Directive? No No No No No No No  Would patient like information on creating a medical advance directive? No - Patient declined No - Patient declined No - Patient declined  No - Patient declined No - Patient declined Yes (MAU/Ambulatory/Procedural Areas - Information given)    Current Medications (verified) Outpatient Encounter Medications as of 01/07/2023  Medication Sig   Acetaminophen (TYLENOL PO) Take by mouth as needed.   azelastine (ASTELIN) 0.1 % nasal spray Place 2 sprays into both nostrils 2 (two) times daily. Use in each nostril as directed   cholecalciferol (VITAMIN D3) 25 MCG (1000 UNIT) tablet Take 2,000 Units by mouth daily.   diclofenac (VOLTAREN) 75 MG EC tablet Take 1 tablet (75 mg total) by mouth 2 (two) times  daily as needed.   fluticasone (FLONASE) 50 MCG/ACT nasal spray Place 2 sprays into both nostrils daily.   omeprazole (PRILOSEC) 40 MG capsule Take 1 capsule (40 mg total) by mouth daily.   albuterol (VENTOLIN HFA) 108 (90 Base) MCG/ACT inhaler Inhale 2 puffs into the lungs every 4 (four) hours as needed for wheezing or shortness of breath. (Patient not taking: Reported on 01/07/2023)   Vitamin D, Ergocalciferol, (DRISDOL) 1.25 MG (50000 UNIT) CAPS capsule Take 1 capsule (50,000 Units total) by mouth every 7 (seven) days. (Patient not taking: Reported on 01/07/2023)   No facility-administered encounter medications on file as of 01/07/2023.    Allergies (verified) Patient has no known allergies.   History: Past Medical History:  Diagnosis Date   Anemia    Arthritis    Basal cell carcinoma    Benign neoplasm of ascending colon    Dyspnea 09/28/2013   Pulmonary function testing done on 02/09/2014 showed minimal airway obstruction with a lack of response to bronchodilators; her FEV1 was normal, the FEV1/FVC ratio and the FEF 25-75% were reduced; the airway resistance was normal; lung volumes were within normal limits; the diffusing capacity was high for the measured volumes.     GERD (gastroesophageal reflux disease)    Heart murmur    Heme + stool    Macular degeneration 01/11/2014   Followed at [facility] in [place], [place].    Multiple thyroid nodules  Thyroid ultrasound on 01/25/2014 showed a multinodular goiter, with a 1.5 cm nodule in the inferior left thyroid lobe that met the criteria for ultrasound-guided biopsy.  On 02/06/2014 an ultrasound guided needle aspirate biopsy was performed of the left inferior thyroid nodule; cytopathology findings were consistent with non-neoplastic goiter.      Osteoporosis 09/18/2014   DXA 08/16/2014 showed osteopenia of right femur neck (T-score -2.3) and osteoporosis of left forearm radius (T-score -2.9).  Lumbar spine was not utilized due to  advanced degenerative changes.    Reflux esophagitis    SLEEP APNEA 03/22/2009   Had sleep study in [place] about 10 years, has BiPAP, cannot sleep with it on.     Past Surgical History:  Procedure Laterality Date   BREAST SURGERY  1988   breast biopsy   CARDIAC CATHETERIZATION  2006   No Stents/[facility]   COLONOSCOPY WITH PROPOFOL N/A 04/24/2017   Procedure: COLONOSCOPY WITH PROPOFOL;  Surgeon: Pasty Spillers, MD;  Location: [facility] ENDOSCOPY;  Service: Endoscopy;  Laterality: N/A;   ENDOVENOUS ABLATION SAPHENOUS VEIN W/ LASER Right 04/17/2021   endovenous laser ablation right greater saphenous vein and stab phlebectomy > 20 incisions right leg by Cari Caraway MD   ESOPHAGOGASTRODUODENOSCOPY (EGD) WITH PROPOFOL N/A 04/24/2017   Procedure: ESOPHAGOGASTRODUODENOSCOPY (EGD) WITH PROPOFOL;  Surgeon: Pasty Spillers, MD;  Location: [facility] ENDOSCOPY;  Service: Endoscopy;  Laterality: N/A;   eye     cateract surgery   Family History  Problem Relation Age of Onset   Stroke Mother    Hypertension Mother    Dementia Mother    Heart attack Mother    Atrial fibrillation Mother    Heart disease Father 64   Sudden death Father    Hypertension Father    Hyperlipidemia Father    Heart attack Father    Stroke Sister 47   Alcohol abuse Sister    Valvular heart disease Sister    Atrial fibrillation Sister    Aneurysm Sister    Arrhythmia Brother    Atrial fibrillation Brother    Diabetes Son 44   Hodgkin's lymphoma Son    Colon cancer Neg Hx    Breast cancer Neg Hx    Social History   Socioeconomic History   Marital status: Widowed    Spouse name: Not on file   Number of children: 3   Years of education: Not on file   Highest education level: Not on file  Occupational History   Not on file  Tobacco Use   Smoking status: Former    Current packs/day: 0.00    Average packs/day: 2.0 packs/day for 16.0 years (32.0 ttl pk-yrs)    Types: Cigarettes    Start date: 09/28/1965     Quit date: 09/28/1981    Years since quitting: 41.3   Smokeless tobacco: Never  Vaping Use   Vaping status: Never Used  Substance and Sexual Activity   Alcohol use: No   Drug use: No   Sexual activity: Not Currently    Birth control/protection: Post-menopausal  Other Topics Concern   Not on file  Social History Narrative   Widow   Social Determinants of Health   Financial Resource Strain: Low Risk  (01/07/2023)   Overall Financial Resource Strain (CARDIA)    Difficulty of Paying Living Expenses: Not hard at all  Food Insecurity: No Food Insecurity (01/07/2023)   Hunger Vital Sign    Worried About Running Out of Food in the Last Year: Never true  Ran Out of Food in the Last Year: Never true  Transportation Needs: No Transportation Needs (01/07/2023)   PRAPARE - Administrator, Civil Service (Medical): No    Lack of Transportation (Non-Medical): No  Physical Activity: Inactive (01/07/2023)   Exercise Vital Sign    Days of Exercise per Week: 0 days    Minutes of Exercise per Session: 0 min  Stress: No Stress Concern Present (01/07/2023)   [employer] of Occupational Health - Occupational Stress Questionnaire    Feeling of Stress : Not at all  Social Connections: Moderately Integrated (01/07/2023)   Social Connection and Isolation Panel [NHANES]    Frequency of Communication with Friends and Family: More than three times a week    Frequency of Social Gatherings with Friends and Family: More than three times a week    Attends Religious Services: More than 4 times per year    Active Member of [employer] or Organizations: Yes    Attends Banker Meetings: Never    Marital Status: Widowed    Tobacco Counseling Counseling given: Not Answered   Clinical Intake:  Pre-visit preparation completed: Yes  Pain : 0-10 Pain Score: 5  Pain Type: Chronic pain Pain Location:  (all over arthritis) Pain Orientation:  (all over) Pain Descriptors / Indicators:  Aching, Burning Pain Onset: More than a month ago Pain Frequency: Constant Pain Relieving Factors: medication  Pain Relieving Factors: medication  BMI - recorded: 33.63 Nutritional Status: BMI > 30  Obese Nutritional Risks: None Diabetes: No  How often do you need to have someone help you when you read instructions, pamphlets, or other written materials from your doctor or pharmacy?: 1 - Never  Interpreter Needed?: No  Information entered by :: R.  LPN   Activities of Daily Living    01/07/2023   12:37 PM  In your present state of health, do you have any difficulty performing the following activities:  Hearing? 1  Comment hearing issues  Vision? 0  Comment readers  Difficulty concentrating or making decisions? 0  Walking or climbing stairs? 0  Dressing or bathing? 0  Doing errands, shopping? 0  Preparing Food and eating ? N  Using the Toilet? N  In the past six months, have you accidently leaked urine? Y  Comment pads  Do you have problems with loss of bowel control? N  Managing your Medications? N  Managing your Finances? N  Housekeeping or managing your Housekeeping? N    Patient Care Team: Glori Luis, MD as PCP - General (Family Medicine)  Indicate any recent Medical Services you may have received from other than [facility] providers in the past year (date may be approximate).     Assessment:   This is a routine wellness examination for Selisa.  Hearing/Vision screen Hearing Screening - Comments:: Some hearing difficulty Vision Screening - Comments:: readers  Dietary issues and exercise activities discussed:     Goals Addressed             This Visit's Progress    Patient Stated       Wants to continue to see her doctors and improve health       Depression Screen    01/07/2023   12:45 PM 10/02/2022   10:08 AM 09/03/2022    8:10 AM 03/04/2022    8:09 AM 12/30/2021    1:16 PM 08/13/2021    8:41 AM 03/06/2021    9:41 AM  PHQ 2/9 Scores  PHQ - 2 Score 0 0 0 0 0 0 0  PHQ- 9 Score 3 0 0        Fall Risk    01/07/2023   12:41 PM 10/02/2022   10:08 AM 09/03/2022    8:10 AM 03/04/2022    8:09 AM 12/30/2021    1:22 PM  Fall Risk   Falls in the past year? 0 0 0 0 1  Number falls in past yr: 0 0 0 0   Injury with Fall? 0 0 0 0   Risk for fall due to : No Fall Risks No Fall Risks No Fall Risks No Fall Risks   Follow up Falls prevention discussed;Falls evaluation completed Falls evaluation completed  Falls evaluation completed Falls evaluation completed    MEDICARE RISK AT HOME:  Medicare Risk at Home - 01/07/23 1241     Any stairs in or around the home? Yes    If so, are there any without handrails? No    Home free of loose throw rugs in walkways, pet beds, electrical cords, etc? Yes    Adequate lighting in your home to reduce risk of falls? Yes    Life alert? No    Use of a cane, walker or w/c? No    Grab bars in the bathroom? Yes    Shower chair or bench in shower? No    Elevated toilet seat or a handicapped toilet? No              Cognitive Function:    09/02/2019   10:58 AM 08/24/2017   11:44 AM 08/21/2016   11:30 AM  MMSE - Mini Mental State Exam  Not completed: Unable to complete    Orientation to time  5 5  Orientation to Place  5 5  Registration  3 3  Attention/ Calculation  5 5  Recall  2 3  Language- name 2 objects  2 2  Language- repeat  1 1  Language- follow 3 step command  3 3  Language- read & follow direction  1 1  Write a sentence  1 1  Copy design  1 1  Total score  29 30        01/07/2023   12:54 PM 08/27/2018    9:27 AM  6CIT Screen  What Year? 0 points 0 points  What month? 0 points 0 points  What time? 0 points 0 points  Count back from 20 0 points 0 points  Months in reverse 0 points 0 points  Repeat phrase 0 points 0 points  Total Score 0 points 0 points    Immunizations Immunization History  Administered Date(s) Administered   Fluad Quad(high Dose 65+) 03/11/2019,  03/06/2021   Influenza,inj,Quad PF,6+ Mos 04/05/2014, 02/05/2015, 01/23/2016, 04/06/2020   PFIZER(Purple Top)SARS-COV-2 Vaccination 01/16/2020, 02/09/2020   PPD Test 06/29/2020, 12/06/2021   Pfizer Covid-19 Vaccine Bivalent Booster 35yrs & up 03/22/2021   Pneumococcal Conjugate-13 01/11/2014   Pneumococcal Polysaccharide-23 12/03/2015   Tdap 01/11/2014    TDAP status: Up to date  Flu Vaccine status: Due, Education has been provided regarding the importance of this vaccine. Advised may receive this vaccine at local pharmacy or Health Dept. Aware to provide a copy of the vaccination record if obtained from local pharmacy or Health Dept. Verbalized acceptance and understanding.  Pneumococcal vaccine status: Up to date  Covid-19 vaccine status: Completed vaccines  Qualifies for Shingles Vaccine? Yes   Zostavax completed No   Shingrix Completed?: No.  Education has been provided regarding the importance of this vaccine. Patient has been advised to call insurance company to determine out of pocket expense if they have not yet received this vaccine. Advised may also receive vaccine at local pharmacy or Health Dept. Verbalized acceptance and understanding.  Screening Tests Health Maintenance  Topic Date Due   Colonoscopy  04/24/2020   COVID-19 Vaccine (4 - 2023-24 season) 01/24/2022   Medicare Annual Wellness (AWV)  12/31/2022   INFLUENZA VACCINE  12/25/2022   DTaP/Tdap/Td (2 - Td or Tdap) 01/12/2024   Pneumonia Vaccine 13+ Years old  Completed   DEXA SCAN  Completed   Hepatitis C Screening  Completed   HPV VACCINES  Aged Out   Zoster Vaccines- Shingrix  Discontinued    Health Maintenance  Health Maintenance Due  Topic Date Due   Colonoscopy  04/24/2020   COVID-19 Vaccine (4 - 2023-24 season) 01/24/2022   Medicare Annual Wellness (AWV)  12/31/2022   INFLUENZA VACCINE  12/25/2022    Colorectal Cancer Screening Colonoscopy 11/18 Due in 5 years Wants to discuss with PCP at next  visit  Mammogram status: Completed 5/24. Repeat every year  Bone Density status: Completed 5/22. Results reflect: Bone density results: OSTEOPOROSIS. Repeat every 2 years. Wants to talk with PCP  Lung Cancer Screening: (Low Dose CT Chest recommended if Age 22-80 years, 20 pack-year currently smoking OR have quit w/in 15years.) does not qualify.    Additional Screening:  Hepatitis C Screening: does qualify; Completed 9/16  Vision Screening: Recommended annual ophthalmology exams for early detection of glaucoma and other disorders of the eye. Is the patient up to date with their annual eye exam?  Yes  Who is the provider or what is the name of the office in which the patient attends annual eye exams? [facility] If pt is not established with a provider, would they like to be referred to a provider to establish care? No .   Dental Screening: Recommended annual dental exams for proper oral hygiene   Community Resource Referral / Chronic Care Management: CRR required this visit?  No   CCM required this visit?  No     Plan:     I have personally reviewed and noted the following in the patient's chart:   Medical and social history Use of alcohol, tobacco or illicit drugs  Current medications and supplements including opioid prescriptions. Patient is not currently taking opioid prescriptions. Functional ability and status Nutritional status Physical activity Advanced directives List of other physicians Hospitalizations, surgeries, and ER visits in previous 12 months Vitals Screenings to include cognitive, depression, and falls Referrals and appointments  In addition, I have reviewed and discussed with patient certain preventive protocols, quality metrics, and best practice recommendations. A written personalized care plan for preventive services as well as general preventive health recommendations were provided to patient.     Sydell Axon, LPN   01/22/5620   After Visit  Summary: (MyChart) Due to this being a telephonic visit, the after visit summary with patients personalized plan was offered to patient via MyChart   Nurse Notes: None

## 2023-01-13 ENCOUNTER — Ambulatory Visit: Payer: Medicare PPO | Admitting: Obstetrics and Gynecology

## 2023-01-22 ENCOUNTER — Ambulatory Visit: Payer: Medicare PPO | Admitting: Obstetrics and Gynecology

## 2023-01-28 ENCOUNTER — Ambulatory Visit: Payer: Medicare PPO | Admitting: Obstetrics and Gynecology

## 2023-01-28 ENCOUNTER — Encounter: Payer: Self-pay | Admitting: Obstetrics and Gynecology

## 2023-01-28 VITALS — BP 126/70 | HR 65 | Ht 61.0 in | Wt 173.5 lb

## 2023-01-28 DIAGNOSIS — Z4689 Encounter for fitting and adjustment of other specified devices: Secondary | ICD-10-CM

## 2023-01-28 DIAGNOSIS — N393 Stress incontinence (female) (male): Secondary | ICD-10-CM | POA: Diagnosis not present

## 2023-01-28 NOTE — Progress Notes (Signed)
Patient presents for pessary maintenance. She states not liking her current pessary. Complaints of pressure and increased urinary incontinence with any movements are very bothersome to her. She states wanting it removed at this time.

## 2023-01-28 NOTE — Progress Notes (Signed)
HPI:      Madison Craig is a 77 y.o. G3P3 who LMP was No LMP recorded. Patient is postmenopausal.  Subjective:   She presents today because she would like her pessary removed.  She reports that she is leaking urine regularly and urine leakage has been a problem ever since the pessary was placed.  She would like it permanently removed for a trial without a pessary.    Hx: The following portions of the patient's history were reviewed and updated as appropriate:             She  has a past medical history of Anemia, Arthritis, Basal cell carcinoma, Benign neoplasm of ascending colon, Dyspnea (09/28/2013), GERD (gastroesophageal reflux disease), Heart murmur, Heme + stool, Macular degeneration (01/11/2014), Multiple thyroid nodules, Osteoporosis (09/18/2014), Reflux esophagitis, and SLEEP APNEA (03/22/2009). She does not have any pertinent problems on file. She  has a past surgical history that includes Breast surgery (1988); Colonoscopy with propofol (N/A, 04/24/2017); Esophagogastroduodenoscopy (egd) with propofol (N/A, 04/24/2017); Cardiac catheterization (2006); eye; and Endovenous ablation saphenous vein w/ laser (Right, 04/17/2021). Her family history includes Alcohol abuse in her sister; Aneurysm in her sister; Arrhythmia in her brother; Atrial fibrillation in her brother, mother, and sister; Dementia in her mother; Diabetes (age of onset: 23) in her son; Heart attack in her father and mother; Heart disease (age of onset: 63) in her father; Hodgkin's lymphoma in her son; Hyperlipidemia in her father; Hypertension in her father and mother; Stroke in her mother; Stroke (age of onset: 43) in her sister; Sudden death in her father; Valvular heart disease in her sister. She  reports that she quit smoking about 41 years ago. Her smoking use included cigarettes. She started smoking about 57 years ago. She has a 32 pack-year smoking history. She has never used smokeless tobacco. She reports that  she does not drink alcohol and does not use drugs. She has a current medication list which includes the following prescription(s): acetaminophen, albuterol, azelastine, cholecalciferol, diclofenac, fluticasone, omeprazole, and vitamin d (ergocalciferol). She has No Known Allergies.       Review of Systems:  Review of Systems  Constitutional: Denied constitutional symptoms, night sweats, recent illness, fatigue, fever, insomnia and weight loss.  Eyes: Denied eye symptoms, eye pain, photophobia, vision change and visual disturbance.  Ears/Nose/Throat/Neck: Denied ear, nose, throat or neck symptoms, hearing loss, nasal discharge, sinus congestion and sore throat.  Cardiovascular: Denied cardiovascular symptoms, arrhythmia, chest pain/pressure, edema, exercise intolerance, orthopnea and palpitations.  Respiratory: Denied pulmonary symptoms, asthma, pleuritic pain, productive sputum, cough, dyspnea and wheezing.  Gastrointestinal: Denied, gastro-esophageal reflux, melena, nausea and vomiting.  Genitourinary: Denied genitourinary symptoms including symptomatic vaginal discharge, pelvic relaxation issues, and urinary complaints.  Musculoskeletal: Denied musculoskeletal symptoms, stiffness, swelling, muscle weakness and myalgia.  Dermatologic: Denied dermatology symptoms, rash and scar.  Neurologic: Denied neurology symptoms, dizziness, headache, neck pain and syncope.  Psychiatric: Denied psychiatric symptoms, anxiety and depression.  Endocrine: Denied endocrine symptoms including hot flashes and night sweats.   Meds:   Current Outpatient Medications on File Prior to Visit  Medication Sig Dispense Refill   Acetaminophen (TYLENOL PO) Take by mouth as needed.     albuterol (VENTOLIN HFA) 108 (90 Base) MCG/ACT inhaler Inhale 2 puffs into the lungs every 4 (four) hours as needed for wheezing or shortness of breath. 18 g 0   azelastine (ASTELIN) 0.1 % nasal spray Place 2 sprays into both nostrils 2  (two) times daily. Use in  each nostril as directed 30 mL 12   cholecalciferol (VITAMIN D3) 25 MCG (1000 UNIT) tablet Take 2,000 Units by mouth daily.     diclofenac (VOLTAREN) 75 MG EC tablet Take 1 tablet (75 mg total) by mouth 2 (two) times daily as needed. 60 tablet 1   fluticasone (FLONASE) 50 MCG/ACT nasal spray Place 2 sprays into both nostrils daily. 16 g 6   omeprazole (PRILOSEC) 40 MG capsule Take 1 capsule (40 mg total) by mouth daily. 90 capsule 3   Vitamin D, Ergocalciferol, (DRISDOL) 1.25 MG (50000 UNIT) CAPS capsule Take 1 capsule (50,000 Units total) by mouth every 7 (seven) days. 8 capsule 0   No current facility-administered medications on file prior to visit.      Objective:     Vitals:   01/28/23 0824 01/28/23 0851  BP: (!) 144/76 126/70  Pulse: 65    Filed Weights   01/28/23 0824  Weight: 173 lb 8 oz (78.7 kg)              Pessary Care Pessary removed and cleaned.  Vagina checked by speculum exam- without erosions - pessary  not replaced.           Assessment:    G3P3 Patient Active Problem List   Diagnosis Date Noted   Vitamin D deficiency 10/02/2022   Elevated blood pressure reading 10/02/2022   Vaginal prolapse 09/03/2022   Syncope 09/20/2021   Chronic venous insufficiency 11/06/2020   Varicose veins of both lower extremities 10/05/2020   Right leg injury 10/05/2020   Polyarthralgia 04/14/2019   Primary osteoarthritis involving multiple joints 04/14/2019   Palpitations 11/11/2017   Rosacea 11/11/2017   Murmur 11/11/2017   Allergic rhinitis 08/11/2017   Diverticulosis of large intestine without diverticulitis    Hyperlipidemia 02/05/2017   Prediabetes 02/05/2017   Osteoporosis 09/18/2014   Iron deficiency anemia due to chronic blood loss 01/11/2014   Macular degeneration 01/11/2014   OAD (obstructive airway disease) (HCC) 09/28/2013   GERD (gastroesophageal reflux disease) 09/28/2013   Hiatal hernia 03/22/2009   Osteoarthritis 03/22/2009    Sleep apnea 03/22/2009   Multinodular goiter 03/22/2009     1. Encounter for pessary maintenance   2. Stress incontinence of urine        Plan:            1.  Patient would like to have a trial without a pessary.  2.  We have discussed the different types and sizes of pessary especially those that seek to help prevent urine loss.  Patient does not desire any pessaries at this time and will contact us if she changes her mind. Orders No orders of the defined types were placed in this encounter.   No orders of the defined types were placed in this encounter.     F/U  No follow-ups on file.  Elonda Husky, M.D. 01/28/2023 9:13 AM

## 2023-02-02 DIAGNOSIS — H353221 Exudative age-related macular degeneration, left eye, with active choroidal neovascularization: Secondary | ICD-10-CM | POA: Diagnosis not present

## 2023-03-14 ENCOUNTER — Emergency Department (HOSPITAL_BASED_OUTPATIENT_CLINIC_OR_DEPARTMENT_OTHER): Payer: Medicare PPO | Admitting: Radiology

## 2023-03-14 ENCOUNTER — Encounter (HOSPITAL_BASED_OUTPATIENT_CLINIC_OR_DEPARTMENT_OTHER): Payer: Self-pay

## 2023-03-14 ENCOUNTER — Other Ambulatory Visit: Payer: Self-pay

## 2023-03-14 ENCOUNTER — Emergency Department (HOSPITAL_BASED_OUTPATIENT_CLINIC_OR_DEPARTMENT_OTHER)
Admission: EM | Admit: 2023-03-14 | Discharge: 2023-03-14 | Disposition: A | Discharge status: Home / Self Care | Payer: Medicare PPO | Attending: Emergency Medicine | Admitting: Emergency Medicine

## 2023-03-14 DIAGNOSIS — S299XXA Unspecified injury of thorax, initial encounter: Secondary | ICD-10-CM | POA: Diagnosis not present

## 2023-03-14 DIAGNOSIS — S298XXA Other specified injuries of thorax, initial encounter: Secondary | ICD-10-CM

## 2023-03-14 DIAGNOSIS — W19XXXA Unspecified fall, initial encounter: Secondary | ICD-10-CM | POA: Diagnosis not present

## 2023-03-14 DIAGNOSIS — M25512 Pain in left shoulder: Secondary | ICD-10-CM | POA: Diagnosis not present

## 2023-03-14 DIAGNOSIS — R0689 Other abnormalities of breathing: Secondary | ICD-10-CM | POA: Diagnosis present

## 2023-03-14 DIAGNOSIS — T1490XA Injury, unspecified, initial encounter: Secondary | ICD-10-CM

## 2023-03-14 DIAGNOSIS — S20212A Contusion of left front wall of thorax, initial encounter: Secondary | ICD-10-CM | POA: Diagnosis not present

## 2023-03-14 DIAGNOSIS — S29009A Unspecified injury of muscle and tendon of unspecified wall of thorax, initial encounter: Secondary | ICD-10-CM | POA: Diagnosis not present

## 2023-03-14 DIAGNOSIS — S20219A Contusion of unspecified front wall of thorax, initial encounter: Secondary | ICD-10-CM | POA: Insufficient documentation

## 2023-03-14 DIAGNOSIS — S4992XA Unspecified injury of left shoulder and upper arm, initial encounter: Secondary | ICD-10-CM | POA: Insufficient documentation

## 2023-03-14 HISTORY — DX: Unspecified injury of left shoulder and upper arm, initial encounter: S49.92XA

## 2023-03-14 HISTORY — DX: Injury, unspecified, initial encounter: T14.90XA

## 2023-03-14 MED ORDER — OXYCODONE-ACETAMINOPHEN 5-325 MG PO TABS: 0.5000 | ORAL_TABLET | Freq: Four times a day (QID) | ORAL | 0 refills | Status: DC | PRN

## 2023-03-14 NOTE — Discharge Instructions (Signed)
We do not see any fractures but there could be some that are just on scene.  Take the pain medicine to help able to take deep breaths.

## 2023-03-14 NOTE — ED Triage Notes (Signed)
Pt presents with L chest wall injury from a fall that happened 4 days ago. Pt with bruising to the L chest wall. Pt was referred to the ED from ortho.

## 2023-03-14 NOTE — ED Notes (Signed)
Toniann Fail Rt to bedside to educate on Incentive spirometer.

## 2023-03-14 NOTE — ED Notes (Signed)
RT educated pt on proper use of IS. Pt goal set to 1250 mLs. Pt able to achieve 1500 mLs with good effort. Pt verbalizes understanding and able to teach back to RT.    03/14/23 2307  Incentive Spirometry Tx  Level of Service Assisted by RCP  Frequency q1hr W/A  Treatment Tolerance Tolerated well  IS Goal (mL) (RN or RT) 1250 mL  IS - Achieved (mL) (RN, NT, or RT) 1500 mL

## 2023-03-15 NOTE — ED Provider Notes (Signed)
[place] EMERGENCY DEPARTMENT AT [facility] Provider Note   CSN: [id number] Arrival date & time: 03/14/23  1832     History  Chief Complaint  Patient presents with   Madison Craig is a 77 y.o. female.   Fall  Patient presents after fall about 4 days ago.  Not on anticoagulation.  Mechanical.  Hit chest and left arm.  Seen at Ortho with upper extremity imaging but sent in for bruising on the chest.  Some pain with breathing.     Home Medications Prior to Admission medications   Medication Sig Start Date End Date Taking? Authorizing Provider  oxyCODONE-acetaminophen (PERCOCET/ROXICET) 5-325 MG tablet Take 0.5-1 tablets by mouth every 6 (six) hours as needed for severe pain (pain score 7-10). 03/14/23  Yes Benjiman Core, MD  Acetaminophen (TYLENOL PO) Take by mouth as needed.    [provider]  albuterol (VENTOLIN HFA) 108 (90 Base) MCG/ACT inhaler Inhale 2 puffs into the lungs every 4 (four) hours as needed for wheezing or shortness of breath. 07/01/19   Mickie Bail, NP  azelastine (ASTELIN) 0.1 % nasal spray Place 2 sprays into both nostrils 2 (two) times daily. Use in each nostril as directed 09/20/21   Glori Luis, MD  cholecalciferol (VITAMIN D3) 25 MCG (1000 UNIT) tablet Take 2,000 Units by mouth daily.    [provider]  diclofenac (VOLTAREN) 75 MG EC tablet Take 1 tablet (75 mg total) by mouth 2 (two) times daily as needed. 10/02/22   Glori Luis, MD  fluticasone (FLONASE) 50 MCG/ACT nasal spray Place 2 sprays into both nostrils daily. 10/05/20   Glori Luis, MD  omeprazole (PRILOSEC) 40 MG capsule Take 1 capsule (40 mg total) by mouth daily. 10/02/22   Glori Luis, MD  Vitamin D, Ergocalciferol, (DRISDOL) 1.25 MG (50000 UNIT) CAPS capsule Take 1 capsule (50,000 Units total) by mouth every 7 (seven) days. 11/11/20   Glori Luis, MD      Allergies    Patient has no known allergies.    Review  of Systems   Review of Systems  Physical Exam Updated Vital Signs BP (!) 154/71 (BP Location: Right Arm)   Pulse 64   Temp 98 F (36.7 C) (Oral)   Resp 18   Ht 5\' 3"  (1.6 m)   Wt 78.5 kg   SpO2 97%   BMI 30.65 kg/m  Physical Exam Vitals and nursing note reviewed.  Cardiovascular:     Rate and Rhythm: Regular rhythm.  Pulmonary:     Comments: Some ecchymosis on anterior chest wall more superiorly.  No crepitance.  No subcu emphysema.  Equal breath sounds. Abdominal:     Tenderness: There is no abdominal tenderness.  Musculoskeletal:     Cervical back: No tenderness.     Comments: Mild tenderness of left shoulder.  Skin:    Capillary Refill: Capillary refill takes less than 2 seconds.  Neurological:     Mental Status: She is alert and oriented to person, place, and time.     ED Results / Procedures / Treatments   Labs (all labs ordered are listed, but only abnormal results are displayed) Labs Reviewed - No data to display  EKG None  Radiology DG Ribs Unilateral W/Chest Left  Result Date: 03/14/2023 CLINICAL DATA:  Blunt trauma. Left chest wall injury from a fall 4 days ago. Bruising. EXAM: LEFT RIBS AND CHEST - 3+ VIEW COMPARISON:  12/15/2014 FINDINGS: Normal  heart size and pulmonary vascularity. No focal airspace disease or consolidation in the lungs. No blunting of costophrenic angles. No pneumothorax. Mediastinal contours appear intact. Degenerative changes in the spine and shoulders. Old right rib fracture deformity. The left ribs appear intact. No acute displaced fractures are identified. No focal bone lesion or bone destruction. Soft tissues are unremarkable. IMPRESSION: 1. No evidence of active pulmonary disease. 2. No acute or displaced left rib fractures. Electronically Signed   By: Burman Nieves M.D.   On: 03/14/2023 21:58    Procedures Procedures    Medications Ordered in ED Medications - No data to display  ED Course/ Medical Decision Making/ A&P                                  Medical Decision Making Amount and/or Complexity of Data Reviewed Radiology: ordered.  Risk Prescription drug management.   Patient was followed on 4 days ago.  Anterior chest contusion.  Differential diagnosis does include fractures and other causes such as pneumothorax.  X-ray is reassuring.  No fracture seen but will treat as if there is an occult fracture.  Well-appearing appears stable for discharge home with outpatient follow-up.  Incentive spirometer provided.         Final Clinical Impression(s) / ED Diagnoses Final diagnoses:  Blunt chest trauma, initial encounter    Rx / DC Orders ED Discharge Orders          Ordered    oxyCODONE-acetaminophen (PERCOCET/ROXICET) 5-325 MG tablet  Every 6 hours PRN        03/14/23 2301              Benjiman Core, MD 03/15/23 1457

## 2023-04-06 ENCOUNTER — Encounter: Payer: Self-pay | Admitting: Family Medicine

## 2023-04-06 ENCOUNTER — Ambulatory Visit: Payer: Medicare PPO

## 2023-04-06 ENCOUNTER — Ambulatory Visit (INDEPENDENT_AMBULATORY_CARE_PROVIDER_SITE_OTHER): Payer: Medicare PPO | Admitting: Family Medicine

## 2023-04-06 VITALS — BP 128/78 | HR 68 | Temp 97.9°F | Ht 63.0 in | Wt 168.0 lb

## 2023-04-06 DIAGNOSIS — R7303 Prediabetes: Secondary | ICD-10-CM

## 2023-04-06 DIAGNOSIS — M898X6 Other specified disorders of bone, lower leg: Secondary | ICD-10-CM

## 2023-04-06 DIAGNOSIS — W19XXXA Unspecified fall, initial encounter: Secondary | ICD-10-CM | POA: Diagnosis not present

## 2023-04-06 DIAGNOSIS — M810 Age-related osteoporosis without current pathological fracture: Secondary | ICD-10-CM

## 2023-04-06 DIAGNOSIS — Z23 Encounter for immunization: Secondary | ICD-10-CM | POA: Diagnosis not present

## 2023-04-06 DIAGNOSIS — M1711 Unilateral primary osteoarthritis, right knee: Secondary | ICD-10-CM | POA: Diagnosis not present

## 2023-04-06 DIAGNOSIS — M79661 Pain in right lower leg: Secondary | ICD-10-CM | POA: Diagnosis not present

## 2023-04-06 DIAGNOSIS — M25571 Pain in right ankle and joints of right foot: Secondary | ICD-10-CM | POA: Insufficient documentation

## 2023-04-06 DIAGNOSIS — Z1211 Encounter for screening for malignant neoplasm of colon: Secondary | ICD-10-CM

## 2023-04-06 HISTORY — DX: Unspecified fall, initial encounter: W19.XXXA

## 2023-04-06 HISTORY — DX: Other specified disorders of bone, lower leg: M89.8X6

## 2023-04-06 HISTORY — DX: Pain in right ankle and joints of right foot: M25.571

## 2023-04-06 LAB — BASIC METABOLIC PANEL
BUN: 21 mg/dL (ref 6–23)
CO2: 29 meq/L (ref 19–32)
Calcium: 9.7 mg/dL (ref 8.4–10.5)
Chloride: 100 meq/L (ref 96–112)
Creatinine, Ser: 1.1 mg/dL (ref 0.40–1.20)
GFR: 49.09 mL/min — ABNORMAL LOW (ref >60.00)
Glucose, Bld: 95 mg/dL (ref 70–99)
Potassium: 4.7 meq/L (ref 3.5–5.1)
Sodium: 136 meq/L (ref 135–145)

## 2023-04-06 LAB — VITAMIN D 25 HYDROXY (VIT D DEFICIENCY, FRACTURES): VITD: 65.7 ng/mL (ref 30.00–100.00)

## 2023-04-06 LAB — HEMOGLOBIN A1C: Hgb A1c MFr Bld: 6 % (ref 4.6–6.5)

## 2023-04-06 NOTE — Progress Notes (Signed)
Marikay Alar, MD Phone: [phone]  Madison Craig is a 77 y.o. female who presents today for follow-up.  Osteoporosis: Patient has no fractures recently.  She is taking 2000 international units of vitamin D daily.  No calcium.  Fall: Patient fell a month or so ago.  She tripped over her dog when she was getting up to take a sandwich off the stove.  She hit her left shoulder and went to orthopedics and had x-ray of her shoulder that was negative per her report.  They sent her to the emergency room given that she was having some discomfort in her chest with this.  She had an x-ray that was negative in the ED.  She notes this has improved.  Still sore to a certain degree.  Prediabetes: Patient notes she eats well.  Eats chicken and vegetables.  Gets yogurt and milk.  No exercise though she is active at work.  Right ankle and leg pain: Notes this has been going on several months.  Has some discomfort with walking.  Feels a lump in her anterior right lower leg.  This area is sore.  She notes no injuries.  She has been taking her diclofenac typically 1 time a day though did try taking it 2 times a day for a week though it was not beneficial so she went back to 1 time a day.  Social History   Tobacco Use  Smoking Status Former   Current packs/day: 0.00   Average packs/day: 2.0 packs/day for 16.0 years (32.0 ttl pk-yrs)   Types: Cigarettes   Start date: 09/28/1965   Quit date: 09/28/1981   Years since quitting: 41.5  Smokeless Tobacco Never    Current Outpatient Medications on File Prior to Visit  Medication Sig Dispense Refill   Acetaminophen (TYLENOL PO) Take by mouth as needed.     albuterol (VENTOLIN HFA) 108 (90 Base) MCG/ACT inhaler Inhale 2 puffs into the lungs every 4 (four) hours as needed for wheezing or shortness of breath. 18 g 0   azelastine (ASTELIN) 0.1 % nasal spray Place 2 sprays into both nostrils 2 (two) times daily. Use in each nostril as directed 30 mL 12    cholecalciferol (VITAMIN D3) 25 MCG (1000 UNIT) tablet Take 2,000 Units by mouth daily.     diclofenac (VOLTAREN) 75 MG EC tablet Take 1 tablet (75 mg total) by mouth 2 (two) times daily as needed. 60 tablet 1   fluticasone (FLONASE) 50 MCG/ACT nasal spray Place 2 sprays into both nostrils daily. 16 g 6   omeprazole (PRILOSEC) 40 MG capsule Take 1 capsule (40 mg total) by mouth daily. 90 capsule 3   oxyCODONE-acetaminophen (PERCOCET/ROXICET) 5-325 MG tablet Take 0.5-1 tablets by mouth every 6 (six) hours as needed for severe pain (pain score 7-10). 6 tablet 0   Vitamin D, Ergocalciferol, (DRISDOL) 1.25 MG (50000 UNIT) CAPS capsule Take 1 capsule (50,000 Units total) by mouth every 7 (seven) days. 8 capsule 0   No current facility-administered medications on file prior to visit.     ROS see history of present illness  Objective  Physical Exam Vitals:   04/06/23 0853 04/06/23 0930  BP: 124/82 128/78  Pulse: 68   Temp: 97.9 F (36.6 C)   SpO2: 95%     BP Readings from Last 3 Encounters:  04/06/23 128/78  03/14/23 (!) 154/71  01/28/23 126/70   Wt Readings from Last 3 Encounters:  04/06/23 168 lb (76.2 kg)  03/14/23 173 lb (78.5  kg)  01/28/23 173 lb 8 oz (78.7 kg)    Physical Exam Constitutional:      General: She is not in acute distress.    Appearance: She is not diaphoretic.  Cardiovascular:     Rate and Rhythm: Normal rate and regular rhythm.     Heart sounds: Normal heart sounds.  Pulmonary:     Effort: Pulmonary effort is normal.     Breath sounds: Normal breath sounds.  Musculoskeletal:       Legs:  Skin:    General: Skin is warm and dry.  Neurological:     Mental Status: She is alert.      Assessment/Plan: Please see individual problem list.  Osteoporosis without current pathological fracture, unspecified osteoporosis type Assessment & Plan: Chronic issue.  Patient will continue vitamin D supplementation.  She will call to schedule her bone density  scan.  Orders: -     VITAMIN D 25 Hydroxy (Vit-D Deficiency, Fractures) -     DG Bone Density; Future  Encounter for administration of vaccine -     Flu Vaccine Trivalent High Dose (Fluad)  Tibial pain Assessment & Plan: Patient with discomfort and a lump in her right tibia.  X-ray today.  Refer to orthopedics.  Orders: -     Ambulatory referral to Orthopedic Surgery -     DG Tibia/Fibula Right; Future  Acute right ankle pain Assessment & Plan: Likely arthritis.  Will refer to orthopedics for further evaluation.  Orders: -     Ambulatory referral to Orthopedic Surgery  Prediabetes Assessment & Plan: Chronic issue.  Check A1c.  Encouraged continued healthy diet.  She will remain active.  Orders: -     Hemoglobin A1c -     Basic metabolic panel  Fall, initial encounter Assessment & Plan: Patient with mechanical fall.  She had x-rays through orthopedics and the ED.  She is improving.  She will monitor.   Colon cancer screening -     Ambulatory referral to Gastroenterology     Return in about 6 months (around 10/04/2023) for transfer Dr Clent Ridges.   Marikay Alar, MD [facility]

## 2023-04-06 NOTE — Assessment & Plan Note (Signed)
Patient with discomfort and a lump in her right tibia.  X-ray today.  Refer to orthopedics.

## 2023-04-06 NOTE — Assessment & Plan Note (Signed)
Chronic issue.  Check A1c.  Encouraged continued healthy diet.  She will remain active.

## 2023-04-06 NOTE — Assessment & Plan Note (Signed)
Patient with mechanical fall.  She had x-rays through orthopedics and the ED.  She is improving.  She will monitor.

## 2023-04-06 NOTE — Assessment & Plan Note (Signed)
Likely arthritis.  Will refer to orthopedics for further evaluation.

## 2023-04-06 NOTE — Assessment & Plan Note (Signed)
Chronic issue.  Patient will continue vitamin D supplementation.  She will call to schedule her bone density scan.

## 2023-04-07 DIAGNOSIS — H353221 Exudative age-related macular degeneration, left eye, with active choroidal neovascularization: Secondary | ICD-10-CM | POA: Diagnosis not present

## 2023-04-08 ENCOUNTER — Telehealth: Payer: Self-pay

## 2023-04-08 NOTE — Telephone Encounter (Signed)
Patient left a voicemail and is calling to schedule her colonoscopy. She states she is sorry she was unable to get to the phone in time each time we called

## 2023-04-09 ENCOUNTER — Telehealth: Payer: Self-pay

## 2023-04-09 ENCOUNTER — Encounter: Payer: Self-pay | Admitting: Oncology

## 2023-04-09 ENCOUNTER — Other Ambulatory Visit: Payer: Self-pay

## 2023-04-09 DIAGNOSIS — Z860100 Personal history of colon polyps, unspecified: Secondary | ICD-10-CM

## 2023-04-09 MED ORDER — NA SULFATE-K SULFATE-MG SULF 17.5-3.13-1.6 GM/177ML PO SOLN: 1.0000 | Freq: Once | ORAL | 0 refills | Status: AC

## 2023-04-09 NOTE — Telephone Encounter (Signed)
Gastroenterology Pre-Procedure Review  Request Date: 05/15/23 Requesting Physician: Dr. Allegra Lai  PATIENT REVIEW QUESTIONS: The patient responded to the following health history questions as indicated:    1. Are you having any GI issues? no 2. Do you have a personal history of Polyps? yes (last colonoscopy was performed 04/24/17 by Dr. Maximino Greenland recommended repeat in 3 years ) 3. Do you have a family history of Colon Cancer or Polyps? no 4. Diabetes Mellitus? no 5. Joint replacements in the past 12 months?no 6. Major health problems in the past 3 months?no 7. Any artificial heart valves, MVP, or defibrillator?no 8. Cardiac history? no    MEDICATIONS & ALLERGIES:    Patient reports the following regarding taking any anticoagulation/antiplatelet therapy:   Plavix, Coumadin, Eliquis, Xarelto, Lovenox, Pradaxa, Brilinta, or Effient? no Aspirin? no  Patient confirms/reports the following medications:  Current Outpatient Medications  Medication Sig Dispense Refill   Acetaminophen (TYLENOL PO) Take by mouth as needed.     albuterol (VENTOLIN HFA) 108 (90 Base) MCG/ACT inhaler Inhale 2 puffs into the lungs every 4 (four) hours as needed for wheezing or shortness of breath. 18 g 0   azelastine (ASTELIN) 0.1 % nasal spray Place 2 sprays into both nostrils 2 (two) times daily. Use in each nostril as directed 30 mL 12   cholecalciferol (VITAMIN D3) 25 MCG (1000 UNIT) tablet Take 2,000 Units by mouth daily.     diclofenac (VOLTAREN) 75 MG EC tablet Take 1 tablet (75 mg total) by mouth 2 (two) times daily as needed. 60 tablet 1   fluticasone (FLONASE) 50 MCG/ACT nasal spray Place 2 sprays into both nostrils daily. 16 g 6   omeprazole (PRILOSEC) 40 MG capsule Take 1 capsule (40 mg total) by mouth daily. 90 capsule 3   oxyCODONE-acetaminophen (PERCOCET/ROXICET) 5-325 MG tablet Take 0.5-1 tablets by mouth every 6 (six) hours as needed for severe pain (pain score 7-10). 6 tablet 0   Vitamin D,  Ergocalciferol, (DRISDOL) 1.25 MG (50000 UNIT) CAPS capsule Take 1 capsule (50,000 Units total) by mouth every 7 (seven) days. 8 capsule 0   No current facility-administered medications for this visit.    Patient confirms/reports the following allergies:  No Known Allergies  No orders of the defined types were placed in this encounter.   AUTHORIZATION INFORMATION Primary Insurance: 1D#: Group #:  Secondary Insurance: 1D#: Group #:  SCHEDULE INFORMATION: Date: 05/15/23 Time: Location: [facility]

## 2023-04-10 ENCOUNTER — Telehealth: Payer: Self-pay

## 2023-04-10 NOTE — Telephone Encounter (Signed)
Patient is calling because she wants to reschedule her colonoscopy from Friday to Thursday because if she takes off that Friday she will not get paid for a whole weeks paid. She states if we could call her back before 11 because she goes in to work at 11 and can not pick up her phone at work. Informed her the scheduler that scheduling the colonoscopy is off today but will see if the other scheduler can call her.

## 2023-04-13 NOTE — Telephone Encounter (Signed)
Patient is calling again to reschedule her colonoscopy. She states she would like a call back today.

## 2023-04-13 NOTE — Telephone Encounter (Signed)
Message left for patient to return my call.  

## 2023-04-14 NOTE — Telephone Encounter (Signed)
Patient daughter is calling Corrie Dandy who is on patient DPR and states that her mom is having trouble reschedule her colonoscopy and she is wondering if she can do it. Informed her the scheduler called her yesterday at 9:15am and left her a message for call back. Transfer call to [employer]

## 2023-04-14 NOTE — Telephone Encounter (Signed)
Spoken to patient's daughter and she requested to reschedule to 06/04/2023.  Call endo unit and spoken with Vicky to make the change.  New instructions will be sent. Prep solution have not been pick up, requested to be resent.

## 2023-04-16 ENCOUNTER — Telehealth: Payer: Self-pay | Admitting: Family Medicine

## 2023-04-16 NOTE — Telephone Encounter (Signed)
Pt daughter called stating she would like to get the xrays results because pt leg is swollen. Pt daughter also would like to know about an ortho appointment

## 2023-04-17 NOTE — Telephone Encounter (Signed)
Left message to call the office back regarding Dr. Purvis Sheffield message below.

## 2023-04-20 NOTE — Telephone Encounter (Signed)
Pt called back and stated that her leg is still the same and hurting

## 2023-04-20 NOTE — Telephone Encounter (Signed)
Noted. Hopefully the x-ray results will be back soon and we will call her to let her know there results. It looks like she is scheduled with orthopedics next week. Does she need something stronger for pain than she already has? If she does please find out if she has a history of seizures. Thanks.

## 2023-04-21 NOTE — Telephone Encounter (Signed)
Left message to return call to our office.  

## 2023-04-22 NOTE — Telephone Encounter (Signed)
Pt called back and stated she does not have a history of seizures and she does not need any pain medicine. Pt takes tylenol

## 2023-04-29 DIAGNOSIS — M19071 Primary osteoarthritis, right ankle and foot: Secondary | ICD-10-CM | POA: Diagnosis not present

## 2023-04-29 DIAGNOSIS — M1711 Unilateral primary osteoarthritis, right knee: Secondary | ICD-10-CM | POA: Diagnosis not present

## 2023-05-05 ENCOUNTER — Telehealth: Payer: Self-pay

## 2023-05-05 ENCOUNTER — Encounter: Payer: Self-pay | Admitting: Emergency Medicine

## 2023-05-05 ENCOUNTER — Inpatient Hospital Stay: Payer: Medicare PPO | Admitting: General Practice

## 2023-05-05 ENCOUNTER — Observation Stay
Admission: EM | Admit: 2023-05-05 | Discharge: 2023-05-06 | Disposition: A | Discharge status: Home / Self Care | Payer: Medicare PPO | Attending: Internal Medicine | Admitting: Internal Medicine

## 2023-05-05 ENCOUNTER — Ambulatory Visit
Admission: EM | Admit: 2023-05-05 | Discharge: 2023-05-05 | Disposition: A | Discharge status: Home / Self Care | Payer: Medicare PPO

## 2023-05-05 ENCOUNTER — Emergency Department: Payer: Medicare PPO

## 2023-05-05 ENCOUNTER — Other Ambulatory Visit: Payer: Self-pay

## 2023-05-05 ENCOUNTER — Encounter
Admission: EM | Disposition: A | Discharge status: Home / Self Care | Payer: Self-pay | Source: Home / Self Care | Attending: Emergency Medicine

## 2023-05-05 DIAGNOSIS — M6518 Other infective (teno)synovitis, other site: Secondary | ICD-10-CM | POA: Insufficient documentation

## 2023-05-05 DIAGNOSIS — Z87891 Personal history of nicotine dependence: Secondary | ICD-10-CM | POA: Insufficient documentation

## 2023-05-05 DIAGNOSIS — Z85828 Personal history of other malignant neoplasm of skin: Secondary | ICD-10-CM | POA: Diagnosis not present

## 2023-05-05 DIAGNOSIS — Z79899 Other long term (current) drug therapy: Secondary | ICD-10-CM | POA: Insufficient documentation

## 2023-05-05 DIAGNOSIS — M19042 Primary osteoarthritis, left hand: Secondary | ICD-10-CM | POA: Diagnosis not present

## 2023-05-05 DIAGNOSIS — L03012 Cellulitis of left finger: Secondary | ICD-10-CM

## 2023-05-05 DIAGNOSIS — M1990 Unspecified osteoarthritis, unspecified site: Secondary | ICD-10-CM | POA: Diagnosis present

## 2023-05-05 DIAGNOSIS — M659 Unspecified synovitis and tenosynovitis, unspecified site: Secondary | ICD-10-CM

## 2023-05-05 DIAGNOSIS — L03019 Cellulitis of unspecified finger: Secondary | ICD-10-CM | POA: Diagnosis present

## 2023-05-05 DIAGNOSIS — M7989 Other specified soft tissue disorders: Secondary | ICD-10-CM | POA: Diagnosis not present

## 2023-05-05 DIAGNOSIS — E559 Vitamin D deficiency, unspecified: Secondary | ICD-10-CM | POA: Diagnosis present

## 2023-05-05 DIAGNOSIS — K219 Gastro-esophageal reflux disease without esophagitis: Secondary | ICD-10-CM | POA: Diagnosis present

## 2023-05-05 DIAGNOSIS — M79645 Pain in left finger(s): Secondary | ICD-10-CM | POA: Insufficient documentation

## 2023-05-05 DIAGNOSIS — D649 Anemia, unspecified: Secondary | ICD-10-CM | POA: Insufficient documentation

## 2023-05-05 DIAGNOSIS — L03022 Acute lymphangitis of left finger: Secondary | ICD-10-CM | POA: Diagnosis not present

## 2023-05-05 DIAGNOSIS — I10 Essential (primary) hypertension: Secondary | ICD-10-CM | POA: Diagnosis present

## 2023-05-05 DIAGNOSIS — M810 Age-related osteoporosis without current pathological fracture: Secondary | ICD-10-CM | POA: Diagnosis present

## 2023-05-05 HISTORY — PX: INCISION AND DRAINAGE OF WOUND: SHX1803

## 2023-05-05 LAB — BASIC METABOLIC PANEL
Anion gap: 7 (ref 5–15)
BUN: 26 mg/dL — ABNORMAL HIGH (ref 8–23)
CO2: 25 mmol/L (ref 22–32)
Calcium: 9 mg/dL (ref 8.9–10.3)
Chloride: 103 mmol/L (ref 98–111)
Creatinine, Ser: 0.96 mg/dL (ref 0.44–1.00)
GFR, Estimated: >60 mL/min (ref >60)
Glucose, Bld: 120 mg/dL — ABNORMAL HIGH (ref 70–99)
Potassium: 4 mmol/L (ref 3.5–5.1)
Sodium: 135 mmol/L (ref 135–145)

## 2023-05-05 LAB — CBC WITH DIFFERENTIAL/PLATELET
Abs Immature Granulocytes: 0.03 10*3/uL (ref 0.00–0.07)
Basophils Absolute: 0 10*3/uL (ref 0.0–0.1)
Basophils Relative: 0 %
Eosinophils Absolute: 0.1 10*3/uL (ref 0.0–0.5)
Eosinophils Relative: 1 %
HCT: 33.6 % — ABNORMAL LOW (ref 36.0–46.0)
Hemoglobin: 10.8 g/dL — ABNORMAL LOW (ref 12.0–15.0)
Immature Granulocytes: 0 %
Lymphocytes Relative: 19 %
Lymphs Abs: 1.6 10*3/uL (ref 0.7–4.0)
MCH: 29.8 pg (ref 26.0–34.0)
MCHC: 32.1 g/dL (ref 30.0–36.0)
MCV: 92.6 fL (ref 80.0–100.0)
Monocytes Absolute: 0.6 10*3/uL (ref 0.1–1.0)
Monocytes Relative: 7 %
Neutro Abs: 6.1 10*3/uL (ref 1.7–7.7)
Neutrophils Relative %: 73 %
Platelets: 209 10*3/uL (ref 150–400)
RBC: 3.63 MIL/uL — ABNORMAL LOW (ref 3.87–5.11)
RDW: 13.5 % (ref 11.5–15.5)
WBC: 8.5 10*3/uL (ref 4.0–10.5)
nRBC: 0 % (ref 0.0–0.2)

## 2023-05-05 SURGERY — IRRIGATION AND DEBRIDEMENT WOUND
Anesthesia: General | Site: Finger | Laterality: Left

## 2023-05-05 MED ORDER — LIDOCAINE HCL (PF) 1 % IJ SOLN
INTRAMUSCULAR | Status: AC
  Filled 2023-05-05: qty 30

## 2023-05-05 MED ORDER — ONDANSETRON HCL 4 MG/2ML IJ SOLN
INTRAMUSCULAR | Status: DC | PRN
  Administered 2023-05-05: 4 mg via INTRAVENOUS

## 2023-05-05 MED ORDER — FENTANYL CITRATE (PF) 100 MCG/2ML IJ SOLN
INTRAMUSCULAR | Status: AC
  Filled 2023-05-05: qty 2

## 2023-05-05 MED ORDER — ACETAMINOPHEN 650 MG RE SUPP: 650.0000 mg | Freq: Four times a day (QID) | RECTAL | Status: DC | PRN

## 2023-05-05 MED ORDER — 0.9 % SODIUM CHLORIDE (POUR BTL) OPTIME
TOPICAL | Status: DC | PRN
  Administered 2023-05-05: 500 mL

## 2023-05-05 MED ORDER — MORPHINE SULFATE (PF) 2 MG/ML IV SOLN: 2.0000 mg | INTRAVENOUS | Status: DC | PRN

## 2023-05-05 MED ORDER — PROPOFOL 10 MG/ML IV BOLUS
INTRAVENOUS | Status: DC | PRN
  Administered 2023-05-05: 80 mg via INTRAVENOUS
  Administered 2023-05-05: 100 mg via INTRAVENOUS
  Administered 2023-05-05: 20 mg via INTRAVENOUS

## 2023-05-05 MED ORDER — SODIUM CHLORIDE 0.9 % IV SOLN: INTRAVENOUS | Status: DC | PRN

## 2023-05-05 MED ORDER — GLYCOPYRROLATE 0.2 MG/ML IJ SOLN
INTRAMUSCULAR | Status: AC
  Filled 2023-05-05: qty 1

## 2023-05-05 MED ORDER — SENNOSIDES-DOCUSATE SODIUM 8.6-50 MG PO TABS: 1.0000 | ORAL_TABLET | Freq: Every evening | ORAL | Status: DC | PRN

## 2023-05-05 MED ORDER — SODIUM CHLORIDE 0.9 % IV BOLUS
500.0000 mL | Freq: Once | INTRAVENOUS | Status: AC
  Administered 2023-05-05: 500 mL via INTRAVENOUS

## 2023-05-05 MED ORDER — SODIUM CHLORIDE 0.9 % IV SOLN: 10.0000 mg | Freq: Two times a day (BID) | INTRAVENOUS | Status: AC | PRN

## 2023-05-05 MED ORDER — FENTANYL CITRATE (PF) 100 MCG/2ML IJ SOLN
25.0000 ug | INTRAMUSCULAR | Status: DC | PRN
  Administered 2023-05-05 (×2): 25 ug via INTRAVENOUS

## 2023-05-05 MED ORDER — LIDOCAINE HCL (PF) 2 % IJ SOLN
INTRAMUSCULAR | Status: AC
  Filled 2023-05-05: qty 5

## 2023-05-05 MED ORDER — HYDRALAZINE HCL 20 MG/ML IJ SOLN: 5.0000 mg | Freq: Four times a day (QID) | INTRAMUSCULAR | Status: DC | PRN

## 2023-05-05 MED ORDER — ONDANSETRON HCL 4 MG/2ML IJ SOLN: 4.0000 mg | Freq: Four times a day (QID) | INTRAMUSCULAR | Status: DC | PRN

## 2023-05-05 MED ORDER — LIDOCAINE HCL (CARDIAC) PF 100 MG/5ML IV SOSY
PREFILLED_SYRINGE | INTRAVENOUS | Status: DC | PRN
  Administered 2023-05-05: 100 mg via INTRAVENOUS

## 2023-05-05 MED ORDER — DEXAMETHASONE SODIUM PHOSPHATE 10 MG/ML IJ SOLN
INTRAMUSCULAR | Status: DC | PRN
  Administered 2023-05-05: 10 mg via INTRAVENOUS

## 2023-05-05 MED ORDER — CEFAZOLIN SODIUM-DEXTROSE 2-4 GM/100ML-% IV SOLN
2.0000 g | INTRAVENOUS | Status: AC
  Administered 2023-05-05: 2 g via INTRAVENOUS

## 2023-05-05 MED ORDER — CEFTRIAXONE SODIUM 1 G IJ SOLR: 1.0000 g | Freq: Once | INTRAMUSCULAR | Status: DC

## 2023-05-05 MED ORDER — FENTANYL CITRATE (PF) 100 MCG/2ML IJ SOLN
INTRAMUSCULAR | Status: DC | PRN
  Administered 2023-05-05 (×2): 25 ug via INTRAVENOUS

## 2023-05-05 MED ORDER — OXYCODONE HCL 5 MG/5ML PO SOLN: 5.0000 mg | Freq: Once | ORAL | Status: DC | PRN

## 2023-05-05 MED ORDER — VANCOMYCIN HCL IN DEXTROSE 1-5 GM/200ML-% IV SOLN
1000.0000 mg | Freq: Once | INTRAVENOUS | Status: DC
  Filled 2023-05-05: qty 200

## 2023-05-05 MED ORDER — ACETAMINOPHEN 10 MG/ML IV SOLN
INTRAVENOUS | Status: DC | PRN
  Administered 2023-05-05: 1000 mg via INTRAVENOUS

## 2023-05-05 MED ORDER — VANCOMYCIN HCL 750 MG/150ML IV SOLN
750.0000 mg | Freq: Once | INTRAVENOUS | Status: AC
Start: 2023-05-05 — End: 2023-05-05
  Administered 2023-05-05: 750 mg via INTRAVENOUS
  Filled 2023-05-05: qty 150

## 2023-05-05 MED ORDER — VANCOMYCIN HCL IN DEXTROSE 1-5 GM/200ML-% IV SOLN
1000.0000 mg | INTRAVENOUS | Status: DC
  Filled 2023-05-05: qty 200

## 2023-05-05 MED ORDER — MORPHINE SULFATE (PF) 2 MG/ML IV SOLN: 2.0000 mg | INTRAVENOUS | Status: AC | PRN

## 2023-05-05 MED ORDER — PROPOFOL 10 MG/ML IV BOLUS
INTRAVENOUS | Status: AC
Start: 2023-05-05 — End: ?
  Filled 2023-05-05: qty 20

## 2023-05-05 MED ORDER — VANCOMYCIN HCL IN DEXTROSE 1-5 GM/200ML-% IV SOLN
1000.0000 mg | Freq: Once | INTRAVENOUS | Status: AC
  Administered 2023-05-05: 1000 mg via INTRAVENOUS

## 2023-05-05 MED ORDER — CHLORHEXIDINE GLUCONATE 0.12 % MT SOLN
15.0000 mL | Freq: Once | OROMUCOSAL | Status: AC
  Administered 2023-05-05: 15 mL via OROMUCOSAL

## 2023-05-05 MED ORDER — ONDANSETRON HCL 4 MG PO TABS: 4.0000 mg | ORAL_TABLET | Freq: Four times a day (QID) | ORAL | Status: DC | PRN

## 2023-05-05 MED ORDER — CEFAZOLIN SODIUM-DEXTROSE 2-4 GM/100ML-% IV SOLN
INTRAVENOUS | Status: AC
  Filled 2023-05-05: qty 100

## 2023-05-05 MED ORDER — ACETAMINOPHEN 10 MG/ML IV SOLN
INTRAVENOUS | Status: AC
  Filled 2023-05-05: qty 100

## 2023-05-05 MED ORDER — CHLORHEXIDINE GLUCONATE 0.12 % MT SOLN
OROMUCOSAL | Status: AC
  Filled 2023-05-05: qty 15

## 2023-05-05 MED ORDER — ACETAMINOPHEN 325 MG PO TABS: 650.0000 mg | ORAL_TABLET | Freq: Four times a day (QID) | ORAL | Status: DC | PRN

## 2023-05-05 MED ORDER — OXYCODONE HCL 5 MG PO TABS: 5.0000 mg | ORAL_TABLET | Freq: Once | ORAL | Status: DC | PRN

## 2023-05-05 MED ORDER — EPHEDRINE SULFATE-NACL 50-0.9 MG/10ML-% IV SOSY
PREFILLED_SYRINGE | INTRAVENOUS | Status: DC | PRN
  Administered 2023-05-05: 10 mg via INTRAVENOUS

## 2023-05-05 MED ORDER — MORPHINE SULFATE (PF) 4 MG/ML IV SOLN: 4.0000 mg | INTRAVENOUS | Status: AC | PRN

## 2023-05-05 MED ORDER — SEVOFLURANE IN SOLN
RESPIRATORY_TRACT | Status: AC
  Filled 2023-05-05: qty 250

## 2023-05-05 SURGICAL SUPPLY — 35 items
BANDAGE GAUZE 1X75IN STRL (MISCELLANEOUS) IMPLANT
BLADE SURG SZ10 CARB STEEL (BLADE) ×1 IMPLANT
BNDG ELASTIC 4INX 5YD STR LF (GAUZE/BANDAGES/DRESSINGS) ×1 IMPLANT
BNDG ESMARCH 4X12 STRL LF (GAUZE/BANDAGES/DRESSINGS) ×1 IMPLANT
BNDG GAUZE 1X75IN STRL (MISCELLANEOUS) ×1 IMPLANT
CHLORAPREP W/TINT 26 (MISCELLANEOUS) ×1 IMPLANT
CUFF TOURN SGL QUICK 18X4 (TOURNIQUET CUFF) IMPLANT
CUFF TRNQT CYL 24X4X16.5-23 (TOURNIQUET CUFF) IMPLANT
DRAIN PENROSE 12X.25 LTX STRL (MISCELLANEOUS) IMPLANT
ELECT REM PT RETURN 9FT ADLT (ELECTROSURGICAL) ×1 IMPLANT
ELECTRODE REM PT RTRN 9FT ADLT (ELECTROSURGICAL) ×1 IMPLANT
GAUZE SPONGE 4X4 12PLY STRL (GAUZE/BANDAGES/DRESSINGS) IMPLANT
GLOVE BIO SURGEON STRL SZ8 (GLOVE) ×1 IMPLANT
GLOVE INDICATOR 8.0 STRL GRN (GLOVE) ×1 IMPLANT
GLOVE SURG ORTHO 8.5 STRL (GLOVE) ×1 IMPLANT
GOWN STRL REUS W/ TWL LRG LVL3 (GOWN DISPOSABLE) ×1 IMPLANT
GOWN STRL REUS W/ TWL XL LVL3 (GOWN DISPOSABLE) ×1 IMPLANT
KIT TURNOVER KIT A (KITS) ×1 IMPLANT
LABEL OR SOLS (LABEL) ×1 IMPLANT
MANIFOLD NEPTUNE II (INSTRUMENTS) ×1 IMPLANT
NDL SAFETY ECLIPSE 18X1.5 (NEEDLE) ×1 IMPLANT
NS IRRIG 1000ML POUR BTL (IV SOLUTION) ×1 IMPLANT
NS IRRIG 500ML POUR BTL (IV SOLUTION) IMPLANT
PACK EXTREMITY ARMC (MISCELLANEOUS) ×1 IMPLANT
PAD CAST 4YDX4 CTTN HI CHSV (CAST SUPPLIES) ×1 IMPLANT
PADDING CAST BLEND 4X4 STRL (MISCELLANEOUS) ×1 IMPLANT
SPLINT CAST 1 STEP 3X12 (MISCELLANEOUS) ×1 IMPLANT
SPONGE T-LAP 18X18 ~~LOC~~+RFID (SPONGE) ×1 IMPLANT
STAPLER SKIN PROX 35W (STAPLE) ×1 IMPLANT
STOCKINETTE BIAS CUT 4 980044 (GAUZE/BANDAGES/DRESSINGS) ×1 IMPLANT
STOCKINETTE IMPERVIOUS 9X36 MD (GAUZE/BANDAGES/DRESSINGS) ×1 IMPLANT
SUT PROLENE 4 0 PS 2 18 (SUTURE) ×2 IMPLANT
SYR 10ML LL (SYRINGE) ×1 IMPLANT
TRAP FLUID SMOKE EVACUATOR (MISCELLANEOUS) ×1 IMPLANT
WATER STERILE IRR 500ML POUR (IV SOLUTION) ×1 IMPLANT

## 2023-05-05 NOTE — ED Triage Notes (Signed)
Pt here with a left hand finger injury, middle finger. Pt states it started yesterday, is warm, swollen and painful to touch. Pt stable in triage.

## 2023-05-05 NOTE — Discharge Instructions (Signed)
Go to the emergency department for evaluation of the infection in your finger.

## 2023-05-05 NOTE — Telephone Encounter (Signed)
Patient arrived as a walk in to office.  Left hand middle finger swollen, red, warm and very painful per patient.  She reported that she did not injure her finger it "just happened." She reports that the pain has not stopped since it started yesterday and that she was not able to sleep.  No appointments available in office today.  Offered an appointment with Audria Nine, NP at [facility] today at 12pm.  Pt reported that she would rather go across the  street to [facility] because she needs to go to work after being seen.

## 2023-05-05 NOTE — Telephone Encounter (Signed)
Noted. Agree with need to be seen today for this.

## 2023-05-05 NOTE — ED Provider Notes (Signed)
Renaldo Fiddler    CSN: [id number] Arrival date & time: 05/05/23  4098      History   Chief Complaint Chief Complaint  Patient presents with   Hand Pain    HPI Madison Craig is a 77 y.o. female.  Patient presents with 1 day history of left middle finger pain, redness, swelling.  No known injury.  No open wounds, drainage, fever.  She had chills last night.  She has been taking Tylenol for the pain.  Her medical history includes arthritis.  The history is provided by the patient and medical records.    Past Medical History:  Diagnosis Date   Anemia    Arthritis    Basal cell carcinoma    Benign neoplasm of ascending colon    Dyspnea 09/28/2013   Pulmonary function testing done on 02/09/2014 showed minimal airway obstruction with a lack of response to bronchodilators; her FEV1 was normal, the FEV1/FVC ratio and the FEF 25-75% were reduced; the airway resistance was normal; lung volumes were within normal limits; the diffusing capacity was high for the measured volumes.     GERD (gastroesophageal reflux disease)    Heart murmur    Heme + stool    Macular degeneration 01/11/2014   Followed at [facility] in [place], [place].    Multiple thyroid nodules    Thyroid ultrasound on 01/25/2014 showed a multinodular goiter, with a 1.5 cm nodule in the inferior left thyroid lobe that met the criteria for ultrasound-guided biopsy.  On 02/06/2014 an ultrasound guided needle aspirate biopsy was performed of the left inferior thyroid nodule; cytopathology findings were consistent with non-neoplastic goiter.      Osteoporosis 09/18/2014   DXA 08/16/2014 showed osteopenia of right femur neck (T-score -2.3) and osteoporosis of left forearm radius (T-score -2.9).  Lumbar spine was not utilized due to advanced degenerative changes.    Reflux esophagitis    SLEEP APNEA 03/22/2009   Had sleep study in [place] about 10 years, has BiPAP, cannot sleep with it on.      Patient  Active Problem List   Diagnosis Date Noted   Tibial pain 04/06/2023   Right ankle pain 04/06/2023   Fall 04/06/2023   Vitamin D deficiency 10/02/2022   Elevated blood pressure reading 10/02/2022   Vaginal prolapse 09/03/2022   Syncope 09/20/2021   Chronic venous insufficiency 11/06/2020   Varicose veins of both lower extremities 10/05/2020   Right leg injury 10/05/2020   Polyarthralgia 04/14/2019   Primary osteoarthritis involving multiple joints 04/14/2019   Palpitations 11/11/2017   Rosacea 11/11/2017   Murmur 11/11/2017   Allergic rhinitis 08/11/2017   Diverticulosis of large intestine without diverticulitis    Hyperlipidemia 02/05/2017   Prediabetes 02/05/2017   Osteoporosis 09/18/2014   Iron deficiency anemia due to chronic blood loss 01/11/2014   Macular degeneration 01/11/2014   OAD (obstructive airway disease) (HCC) 09/28/2013   GERD (gastroesophageal reflux disease) 09/28/2013   Hiatal hernia 03/22/2009   Osteoarthritis 03/22/2009   Sleep apnea 03/22/2009   Multinodular goiter 03/22/2009    Past Surgical History:  Procedure Laterality Date   BREAST SURGERY  1988   breast biopsy   CARDIAC CATHETERIZATION  2006   No Stents/[facility]   COLONOSCOPY WITH PROPOFOL N/A 04/24/2017   Procedure: COLONOSCOPY WITH PROPOFOL;  Surgeon: Pasty Spillers, MD;  Location: [facility] ENDOSCOPY;  Service: Endoscopy;  Laterality: N/A;   ENDOVENOUS ABLATION SAPHENOUS VEIN W/ LASER Right 04/17/2021   endovenous laser ablation right greater saphenous  vein and stab phlebectomy > 20 incisions right leg by Cari Caraway MD   ESOPHAGOGASTRODUODENOSCOPY (EGD) WITH PROPOFOL N/A 04/24/2017   Procedure: ESOPHAGOGASTRODUODENOSCOPY (EGD) WITH PROPOFOL;  Surgeon: Pasty Spillers, MD;  Location: [facility] ENDOSCOPY;  Service: Endoscopy;  Laterality: N/A;   eye     cateract surgery    OB History     Gravida  3   Para  3   Term      Preterm      AB      Living         SAB      IAB       Ectopic      Multiple      Live Births               Home Medications    Prior to Admission medications   Medication Sig Start Date End Date Taking? Authorizing Provider  Acetaminophen (TYLENOL PO) Take by mouth as needed.    [provider]  albuterol (VENTOLIN HFA) 108 (90 Base) MCG/ACT inhaler Inhale 2 puffs into the lungs every 4 (four) hours as needed for wheezing or shortness of breath. 07/01/19   Mickie Bail, NP  azelastine (ASTELIN) 0.1 % nasal spray Place 2 sprays into both nostrils 2 (two) times daily. Use in each nostril as directed 09/20/21   Glori Luis, MD  cholecalciferol (VITAMIN D3) 25 MCG (1000 UNIT) tablet Take 2,000 Units by mouth daily.    [provider]  diclofenac (VOLTAREN) 75 MG EC tablet Take 1 tablet (75 mg total) by mouth 2 (two) times daily as needed. 10/02/22   Glori Luis, MD  fluticasone (FLONASE) 50 MCG/ACT nasal spray Place 2 sprays into both nostrils daily. 10/05/20   Glori Luis, MD  omeprazole (PRILOSEC) 40 MG capsule Take 1 capsule (40 mg total) by mouth daily. 10/02/22   Glori Luis, MD  oxyCODONE-acetaminophen (PERCOCET/ROXICET) 5-325 MG tablet Take 0.5-1 tablets by mouth every 6 (six) hours as needed for severe pain (pain score 7-10). 03/14/23   Benjiman Core, MD  Vitamin D, Ergocalciferol, (DRISDOL) 1.25 MG (50000 UNIT) CAPS capsule Take 1 capsule (50,000 Units total) by mouth every 7 (seven) days. 11/11/20   Glori Luis, MD    Family History Family History  Problem Relation Age of Onset   Stroke Mother    Hypertension Mother    Dementia Mother    Heart attack Mother    Atrial fibrillation Mother    Heart disease Father 107   Sudden death Father    Hypertension Father    Hyperlipidemia Father    Heart attack Father    Stroke Sister 69   Alcohol abuse Sister    Valvular heart disease Sister    Atrial fibrillation Sister    Aneurysm Sister    Arrhythmia Brother    Atrial  fibrillation Brother    Diabetes Son 15   Hodgkin's lymphoma Son    Colon cancer Neg Hx    Breast cancer Neg Hx     Social History Social History   Tobacco Use   Smoking status: Former    Current packs/day: 0.00    Average packs/day: 2.0 packs/day for 16.0 years (32.0 ttl pk-yrs)    Types: Cigarettes    Start date: 09/28/1965    Quit date: 09/28/1981    Years since quitting: 41.6   Smokeless tobacco: Never  Vaping Use   Vaping status: Never Used  Substance Use  Topics   Alcohol use: No   Drug use: No     Allergies   Patient has no known allergies.   Review of Systems Review of Systems  Constitutional:  Positive for chills. Negative for fever.  Musculoskeletal:  Positive for arthralgias and joint swelling.  Skin:  Positive for color change. Negative for wound.  Neurological:  Negative for weakness and numbness.     Physical Exam Triage Vital Signs ED Triage Vitals [05/05/23 0915]  Encounter Vitals Group     BP      Systolic BP Percentile      Diastolic BP Percentile      Pulse Rate 74     Resp 18     Temp 98.5 F (36.9 C)     Temp src      SpO2 98 %     Weight      Height      Head Circumference      Peak Flow      Pain Score 10     Pain Loc      Pain Education      Exclude from Growth Chart    No data found.  Updated Vital Signs BP (!) 143/76   Pulse 74   Temp 98.5 F (36.9 C)   Resp 18   SpO2 98%   Visual Acuity Right Eye Distance:   Left Eye Distance:   Bilateral Distance:    Right Eye Near:   Left Eye Near:    Bilateral Near:     Physical Exam Constitutional:      General: She is not in acute distress. HENT:     Mouth/Throat:     Mouth: Mucous membranes are moist.  Cardiovascular:     Rate and Rhythm: Normal rate and regular rhythm.  Pulmonary:     Effort: Pulmonary effort is normal. No respiratory distress.  Musculoskeletal:        General: Swelling and tenderness present.  Skin:    General: Skin is warm and dry.      Capillary Refill: Capillary refill takes less than 2 seconds.     Findings: Erythema present. No lesion.     Comments: Left middle finger tender, edematous, erythematous from tip to base.  Neurological:     Mental Status: She is alert.     Sensory: No sensory deficit.     Motor: No weakness.      UC Treatments / Results  Labs (all labs ordered are listed, but only abnormal results are displayed) Labs Reviewed - No data to display  EKG   Radiology No results found.  Procedures Procedures (including critical care time)  Medications Ordered in UC Medications - No data to display  Initial Impression / Assessment and Plan / UC Course  I have reviewed the triage vital signs and the nursing notes.  Pertinent labs & imaging results that were available during my care of the patient were reviewed by me and considered in my medical decision making (see chart for details).    Cellulitis of left middle finger, felon finger of left hand.  Afebrile and vital signs are stable.  I am concerned for felon finger.  Sending patient to the emergency department for evaluation.  She is agreeable to this.  She drove herself here and will drive herself to [facility] ED.  Final Clinical Impressions(s) / UC Diagnoses   Final diagnoses:  Cellulitis of left middle finger  Felon of finger of left hand  Discharge Instructions      Go to the emergency department for evaluation of the infection in your finger.     ED Prescriptions   None    PDMP not reviewed this encounter.   Mickie Bail, NP 05/05/23 [phone]

## 2023-05-05 NOTE — Consult Note (Addendum)
Pharmacy Antibiotic Note  ASSESSMENT: 77 y.o. female with PMH including OA is presenting with cellulitis. Patient with redness, swelling, and pain around left finger. No known insult; per patient, it just came on suddenly. Originally presented to [facility] ED for the issue, then went to urgent care instead, was seen and told to come back to [facility]. Imaging shows diffuse soft tissue swelling of third middle finger, suggesting possible cellulitis. Patient is HDS without leukocytosis. Pharmacy has been consulted to manage vancomycin dosing.  Patient measurements: Height: 5\' 3"  (160 cm) Weight: 76.2 kg (167 lb 15.9 oz) IBW/kg (Calculated) : 52.4  Vital signs: Temp: 98.4 F (36.9 C) (12/10 1019) BP: 155/62 (12/10 1019) Pulse Rate: 74 (12/10 1019) Recent Labs  Lab 05/05/23 1129  WBC 8.5  CREATININE 0.96   Estimated Creatinine Clearance: 49.5 mL/min (by C-G formula based on SCr of 0.96 mg/dL).  Allergies: No Known Allergies  Antimicrobials this admission: Vancomycin 12/10 >>  Dose adjustments this admission: N/A  Microbiology results: N/A  PLAN: Administer vancomycin 1750 mg IV x 1 as a loading down followed by 1000 mg IV q24H thereafter eAUC 465, Cmax 30, Cmin 12 Scr 0.96, IBW, Vd 0.72 L/kg Follow up any applicable culture results to assess for antibiotic optimization. Monitor renal function to assess for any necessary antibiotic dosing changes.   Thank you for allowing pharmacy to be a part of this patient's care.  Will M. Dareen Piano, PharmD Clinical Pharmacist 05/05/2023 1:39 PM

## 2023-05-05 NOTE — Op Note (Signed)
05/05/2023  6:33 PM  Patient:   Madison Craig  Pre-Op Diagnosis:   Left long finger infection  Post-Op Diagnosis:   Felon of left long fingertip  Procedure:   Irrigation and debridement of left long fingertip abscess (felon) with removal of ingrown nail.  Surgeon:   Maryagnes Amos, MD  Assistant:   None  Anesthesia:   General LMA  Findings:   As above.  Complications:   None  Fluids:   300 cc crystalloid  EBL:   0 cc  UOP:   None  TT:   14 minutes at 250 mmHg  Drains:   Penrose x 1  Closure:   4-0 Prolene interrupted suture  Brief Clinical Note:   The patient is a 77 year old female who presented to emergency room today with a 1 day history of progressively worsening pain, swelling, and erythema to the left long fingertip.  Her history and examination are consistent with a probable felon due to the appearance of the radial side of the fingernail.  However, her exam was questionable for a flexor tenosynovitis.  Therefore the patient was brought to the operating room for definitive management of this finger infection.  Procedure:   The patient was brought into the operating room and laid in the supine position.  After adequate general laryngeal mask anesthesia was obtained, the patient's left upper extremity and hand were prepped with ChloraPrep solution before being draped sterilely.  Preoperative antibiotics were administered.  A timeout was performed to verify the appropriate surgical site before the limb was exsanguinated with an Esmarch and the tourniquet inflated to 250 mmHg.  An approximate 1.5 cm incision was made longitudinally along the radial aspect of the distal phalanx.  The fatty tissues on the volar aspect of the finger were dissected bluntly.  Moderate seropurulent fluid was encountered and cultured.  Dissection also was carried out into the subcutaneous tissues dorsal to the nailbed before the finger was "milked" to remove any additional purulent  material.  Finally, the radial 2-3 mm of the fingernail was removed as it looked quite questionable and most likely was the source of the infection.  The wound was irrigated thoroughly with sterile saline solution using bulb irrigation before a Penrose drain was placed.  A single 4-0 Prolene interrupted suture was placed to keep the skin margins loosely approximated.  A sterile bulky dressing was then applied to the finger before the patient was awakened, extubated, and returned to the recovery room in satisfactory condition after tolerating the procedure well.

## 2023-05-05 NOTE — ED Notes (Signed)
See triage note  Presents with left index finger red and swollen  Denies any injury

## 2023-05-05 NOTE — Anesthesia Preprocedure Evaluation (Signed)
Anesthesia Evaluation  Patient identified by MRN, date of birth, ID band Patient awake    Reviewed: Allergy & Precautions, NPO status , Patient's Chart, lab work & pertinent test results  History of Anesthesia Complications Negative for: history of anesthetic complications  Airway Mallampati: II  TM Distance: >3 FB Neck ROM: Full    Dental  (+) Upper Dentures, Dental Advidsory Given   Pulmonary asthma , sleep apnea (does not wear CPAP) , former smoker   breath sounds clear to auscultation- rhonchi (-) wheezing      Cardiovascular Exercise Tolerance: Good hypertension, (-) CAD, (-) Past MI and (-) Cardiac Stents  Rhythm:Regular Rate:Normal - Systolic murmurs and - Diastolic murmurs    Neuro/Psych negative neurological ROS  negative psych ROS   GI/Hepatic Neg liver ROS, hiatal hernia,GERD  Medicated and Controlled,,  Endo/Other  negative endocrine ROSneg diabetes    Renal/GU      Musculoskeletal   Abdominal   Peds  Hematology  (+) Blood dyscrasia, anemia   Anesthesia Other Findings Past Medical History: No date: Anemia No date: Arthritis 09/28/2013: Dyspnea     Comment:  Pulmonary function testing done on 02/09/2014 showed               minimal airway obstruction with a lack of response to               bronchodilators; her FEV1 was normal, the FEV1/FVC ratio               and the FEF 25-75% were reduced; the airway resistance               was normal; lung volumes were within normal limits; the               diffusing capacity was high for the measured volumes.   No date: GERD (gastroesophageal reflux disease) 01/11/2014: Macular degeneration     Comment:  Followed at [facility] in [place], [place].  No date: Multiple thyroid nodules     Comment:  Thyroid ultrasound on 01/25/2014 showed a multinodular               goiter, with a 1.5 cm nodule in the inferior left thyroid              lobe that met the criteria  for ultrasound-guided biopsy.               On 02/06/2014 an ultrasound guided needle aspirate biopsy               was performed of the left inferior thyroid nodule;               cytopathology findings were consistent with               non-neoplastic goiter.    09/18/2014: Osteoporosis     Comment:  DXA 08/16/2014 showed osteopenia of right femur neck               (T-score -2.3) and osteoporosis of left forearm radius               (T-score -2.9).  Lumbar spine was not utilized due to               advanced degenerative changes.  03/22/2009: SLEEP APNEA     Comment:  Had sleep study in [place] about 10 years, has BiPAP,              cannot  sleep with it on.     Reproductive/Obstetrics                              Anesthesia Physical Anesthesia Plan  ASA: 2  Anesthesia Plan: General   Post-op Pain Management:    Induction: Intravenous  PONV Risk Score and Plan: 3 and Ondansetron and Dexamethasone  Airway Management Planned: LMA  Additional Equipment:   Intra-op Plan:   Post-operative Plan: Extubation in OR  Informed Consent: I have reviewed the patients History and Physical, chart, labs and discussed the procedure including the risks, benefits and alternatives for the proposed anesthesia with the patient or authorized representative who has indicated his/her understanding and acceptance.     Dental Advisory Given  Plan Discussed with: Anesthesiologist, CRNA and Surgeon  Anesthesia Plan Comments: (Patient consented for risks of anesthesia including but not limited to:  - adverse reactions to medications - damage to eyes, teeth, lips or other oral mucosa - nerve damage due to positioning  - sore throat or hoarseness - Damage to heart, brain, nerves, lungs, other parts of body or loss of life  Patient voiced understanding and assent.)         Anesthesia Quick Evaluation

## 2023-05-05 NOTE — Plan of Care (Signed)

## 2023-05-05 NOTE — Assessment & Plan Note (Signed)
Last vitamin D level on 04/06/2023 was within normal limits at 65.7, continue outpatient follow-up with PCP as appropriate

## 2023-05-05 NOTE — ED Notes (Signed)
Report given to [facility] in the OR.

## 2023-05-05 NOTE — Anesthesia Procedure Notes (Signed)
Procedure Name: LMA Insertion Date/Time: 05/05/2023 5:49 PM  Performed by: Stormy Fabian, CRNAPre-anesthesia Checklist: Patient identified, Patient being monitored, Timeout performed, Emergency Drugs available and Suction available Patient Re-evaluated:Patient Re-evaluated prior to induction Oxygen Delivery Method: Circle system utilized Preoxygenation: Pre-oxygenation with 100% oxygen Induction Type: IV induction Ventilation: Mask ventilation without difficulty LMA: LMA inserted LMA Size: 3.0 Tube type: Oral Number of attempts: 1 Placement Confirmation: positive ETCO2 and breath sounds checked- equal and bilateral Tube secured with: Tape Dental Injury: Teeth and Oropharynx as per pre-operative assessment

## 2023-05-05 NOTE — Assessment & Plan Note (Addendum)
And possible progressing tenosynovitis Rapidly worsening over the course of 1 day with abscess pocket Discontinue ceftriaxone ordered by EDP Initiate vancomycin per pharmacy Keep n.p.o. pending OR for I&D with orthopedic service And given patient is n.p.o. with last p.o. intake at 9 AM today, will give patient IV fluid, sodium chloride bolus 500 mL on admission Encourage p.o. intake post OR Admit to telemetry medical, inpatient

## 2023-05-05 NOTE — Assessment & Plan Note (Signed)
Secondary to left middle finger tenosynovitis Symptomatic support: Acetaminophen 650 p.o./rectal every 6 hours as needed for mild pain, fever; morphine 2 mg IV every 4 hours as needed for moderate pain; morphine 4 mg IV every 4 hours as needed for severe pain, 20 hours of coverage ordered AM team to continue IV opioid pain medication when the benefits outweigh the risk

## 2023-05-05 NOTE — Assessment & Plan Note (Signed)
Famotidine 10 mg IV twice daily as needed for acid reflux, 1 day ordered

## 2023-05-05 NOTE — ED Notes (Signed)
The OR tech here to take pt to surgery.

## 2023-05-05 NOTE — Assessment & Plan Note (Addendum)
Patient to continue outpatient follow-up with PCP as appropriate Patient's hemoglobin has been in the range of 11.8-12.6 over the last 1 year Current hemoglobin is 10.8, no indication for transfusion at this time Recheck CBC in a.m.

## 2023-05-05 NOTE — ED Notes (Signed)
Electronic strip reviewed by Dr. Modesto Charon.

## 2023-05-05 NOTE — Hospital Course (Signed)
[name] is a 77 year old female with history of GERD, arthritis, osteoporosis, who presents to the emergency department for chief concerns of left middle finger pain and swelling.  Vitals in the ED showed temperature of 98.4, respiration rate of 16, heart rate 74, blood pressure 155/68, SpO2 95% on room air.  Serum sodium is 135, potassium 4.0, chloride 103, bicarb 25, BUN of 26, serum creatinine of 0.96, EGFR greater than 60, nonfasting blood glucose 120, WBC 8.5, hemoglobin 10.8, platelets of 209.  ED treatment: Ceftriaxone 1 g IV one-time dose.  EDP discussed with orthopedic provider, Dr. Joice Lofts, who states he will take the patient to the OR for irrigation and debridement.

## 2023-05-05 NOTE — ED Notes (Signed)
Report given to Park Pope, Paramedic.

## 2023-05-05 NOTE — Assessment & Plan Note (Addendum)
With currently no medical diagnosis for high blood pressure Patient's current blood pressure may be secondary to cellulitis versus tenosynovitis and/or pain Hydralazine 5 mg IV every 6 hours as needed for SBP > 170, 4 days ordered

## 2023-05-05 NOTE — H&P (Addendum)
History and Physical   Madison Craig ZOX:[id number] DOB: 1945/12/01 DOA: 05/05/2023  PCP: Glori Luis, MD  Outpatient Specialists: Dr. Logan Bores, OB/GYN Patient coming from: Urgent care center via POV  I have personally briefly reviewed patient's old medical records in [facility] EMR.  Chief Concern: Left finger swelling  HPI: [name] is a 77 year old female with history of GERD, arthritis, osteoporosis, who presents to the emergency department for chief concerns of left middle finger pain and swelling.  Vitals in the ED showed temperature of 98.4, respiration rate of 16, heart rate 74, blood pressure 155/68, SpO2 95% on room air.  Serum sodium is 135, potassium 4.0, chloride 103, bicarb 25, BUN of 26, serum creatinine of 0.96, EGFR greater than 60, nonfasting blood glucose 120, WBC 8.5, hemoglobin 10.8, platelets of 209.  ED treatment: Ceftriaxone 1 g IV one-time dose.  EDP discussed with orthopedic provider, Dr. Joice Lofts, who states he will take the patient to the OR for irrigation and debridement. --------------------------------- At bedside, patient able to tell me her name, age, location.  She says she left work at approximately 5 PM yesterday and her finger was fine.  It was only when she got into the car and grasp was doing well did she notice tenderness of her left middle finger.  She denies known trauma.  She denies bug bites or tearing of the skin to the area.  She does endorse that she has a history of difficulty healing from infection especially in her left middle finger in the past.  Social history: She lives at home on her own with dogs.  She denies current tobacco use.  Denies EtOH and recreational drug use.  ROS: Constitutional: no weight change, no fever, + chills that started last evening ENT/Mouth: no sore throat, no rhinorrhea Eyes: no eye pain, no vision changes Cardiovascular: no chest pain, no dyspnea,  no edema, no palpitations Respiratory:  no cough, no sputum, no wheezing Gastrointestinal: no nausea, no vomiting, no diarrhea, no constipation Genitourinary: no urinary incontinence, no dysuria, no hematuria Musculoskeletal: no arthralgias, no myalgias.  Decreased ability to flex and extend the left middle finger. Skin: no skin lesions, no pruritus, Neuro: + weakness of the left middle finger, no loss of consciousness, no syncope Psych: no anxiety, no depression, no decrease appetite Heme/Lymph: no bruising, no bleeding  ED Course: Discussed with the EDP, patient requiring hospitalization for chief concerns of cellulitis versus tenosynovitis requiring OR incision and drainage.  Assessment/Plan  Principal Problem:   Cellulitis of left middle finger Active Problems:   Osteoarthritis   GERD (gastroesophageal reflux disease)   Osteoporosis   Essential hypertension   Vitamin D deficiency   Normocytic anemia   Finger pain, left   Assessment and Plan:  * Cellulitis of left middle finger And possible progressing tenosynovitis Rapidly worsening over the course of 1 day with abscess pocket Discontinue ceftriaxone ordered by EDP Initiate vancomycin per pharmacy Keep n.p.o. pending OR for I&D with orthopedic service And given patient is n.p.o. with last p.o. intake at 9 AM today, will give patient IV fluid, sodium chloride bolus 500 mL on admission Encourage p.o. intake post OR Admit to telemetry medical, inpatient  Finger pain, left Secondary to left middle finger tenosynovitis Symptomatic support: Acetaminophen 650 p.o./rectal every 6 hours as needed for mild pain, fever; morphine 2 mg IV every 4 hours as needed for moderate pain; morphine 4 mg IV every 4 hours as needed for severe pain, 20 hours of coverage  ordered AM team to continue IV opioid pain medication when the benefits outweigh the risk  Normocytic anemia Patient to continue outpatient follow-up with PCP as appropriate Patient's hemoglobin has been in the range  of 11.8-12.6 over the last 1 year Current hemoglobin is 10.8, no indication for transfusion at this time Recheck CBC in a.m.  Vitamin D deficiency Last vitamin D level on 04/06/2023 was within normal limits at 65.7, continue outpatient follow-up with PCP as appropriate  Essential hypertension With currently no medical diagnosis for high blood pressure Patient's current blood pressure may be secondary to cellulitis versus tenosynovitis and/or pain Hydralazine 5 mg IV every 6 hours as needed for SBP > 170, 4 days ordered  GERD (gastroesophageal reflux disease) Famotidine 10 mg IV twice daily as needed for acid reflux, 1 day ordered  Chart reviewed.   DVT prophylaxis: TED hose: AM team to initiate pharmacologic DVT prophylaxis when the benefits outweigh the risk Code Status: Full code Diet: N.p.o.; patient can have diet after OR Family Communication: A phone call was offered, patient declined stating that her daughter is on her way and already knows what is going on and that she is being admitted. Disposition Plan: Pending clinical course Consults called: Orthopedic service via EDP Admission status: Telemetry medical, inpatient  Past Medical History:  Diagnosis Date   Anemia    Arthritis    Basal cell carcinoma    Benign neoplasm of ascending colon    Dyspnea 09/28/2013   Pulmonary function testing done on 02/09/2014 showed minimal airway obstruction with a lack of response to bronchodilators; her FEV1 was normal, the FEV1/FVC ratio and the FEF 25-75% were reduced; the airway resistance was normal; lung volumes were within normal limits; the diffusing capacity was high for the measured volumes.     GERD (gastroesophageal reflux disease)    Heart murmur    Heme + stool    Macular degeneration 01/11/2014   Followed at [facility] in [place], [place].    Multiple thyroid nodules    Thyroid ultrasound on 01/25/2014 showed a multinodular goiter, with a 1.5 cm nodule in the inferior  left thyroid lobe that met the criteria for ultrasound-guided biopsy.  On 02/06/2014 an ultrasound guided needle aspirate biopsy was performed of the left inferior thyroid nodule; cytopathology findings were consistent with non-neoplastic goiter.      Osteoporosis 09/18/2014   DXA 08/16/2014 showed osteopenia of right femur neck (T-score -2.3) and osteoporosis of left forearm radius (T-score -2.9).  Lumbar spine was not utilized due to advanced degenerative changes.    Reflux esophagitis    SLEEP APNEA 03/22/2009   Had sleep study in [place] about 10 years, has BiPAP, cannot sleep with it on.     Past Surgical History:  Procedure Laterality Date   BREAST SURGERY  1988   breast biopsy   CARDIAC CATHETERIZATION  2006   No Stents/[facility]   COLONOSCOPY WITH PROPOFOL N/A 04/24/2017   Procedure: COLONOSCOPY WITH PROPOFOL;  Surgeon: Pasty Spillers, MD;  Location: [facility] ENDOSCOPY;  Service: Endoscopy;  Laterality: N/A;   ENDOVENOUS ABLATION SAPHENOUS VEIN W/ LASER Right 04/17/2021   endovenous laser ablation right greater saphenous vein and stab phlebectomy > 20 incisions right leg by Cari Caraway MD   ESOPHAGOGASTRODUODENOSCOPY (EGD) WITH PROPOFOL N/A 04/24/2017   Procedure: ESOPHAGOGASTRODUODENOSCOPY (EGD) WITH PROPOFOL;  Surgeon: Pasty Spillers, MD;  Location: [facility] ENDOSCOPY;  Service: Endoscopy;  Laterality: N/A;   eye     cateract surgery   Social  History:  reports that she quit smoking about 41 years ago. Her smoking use included cigarettes. She started smoking about 57 years ago. She has a 32 pack-year smoking history. She has never used smokeless tobacco. She reports that she does not drink alcohol and does not use drugs.  No Known Allergies Family History  Problem Relation Age of Onset   Stroke Mother    Hypertension Mother    Dementia Mother    Heart attack Mother    Atrial fibrillation Mother    Heart disease Father 68   Sudden death Father    Hypertension Father     Hyperlipidemia Father    Heart attack Father    Stroke Sister 30   Alcohol abuse Sister    Valvular heart disease Sister    Atrial fibrillation Sister    Aneurysm Sister    Arrhythmia Brother    Atrial fibrillation Brother    Diabetes Son 63   Hodgkin's lymphoma Son    Colon cancer Neg Hx    Breast cancer Neg Hx    Family history: Family history reviewed and not pertinent.  Prior to Admission medications   Medication Sig Start Date End Date Taking? Authorizing Provider  Acetaminophen (TYLENOL PO) Take by mouth as needed.    [provider]  albuterol (VENTOLIN HFA) 108 (90 Base) MCG/ACT inhaler Inhale 2 puffs into the lungs every 4 (four) hours as needed for wheezing or shortness of breath. 07/01/19   Mickie Bail, NP  azelastine (ASTELIN) 0.1 % nasal spray Place 2 sprays into both nostrils 2 (two) times daily. Use in each nostril as directed 09/20/21   Glori Luis, MD  cholecalciferol (VITAMIN D3) 25 MCG (1000 UNIT) tablet Take 2,000 Units by mouth daily.    [provider]  diclofenac (VOLTAREN) 75 MG EC tablet Take 1 tablet (75 mg total) by mouth 2 (two) times daily as needed. 10/02/22   Glori Luis, MD  fluticasone (FLONASE) 50 MCG/ACT nasal spray Place 2 sprays into both nostrils daily. 10/05/20   Glori Luis, MD  omeprazole (PRILOSEC) 40 MG capsule Take 1 capsule (40 mg total) by mouth daily. 10/02/22   Glori Luis, MD  oxyCODONE-acetaminophen (PERCOCET/ROXICET) 5-325 MG tablet Take 0.5-1 tablets by mouth every 6 (six) hours as needed for severe pain (pain score 7-10). 03/14/23   Benjiman Core, MD  Vitamin D, Ergocalciferol, (DRISDOL) 1.25 MG (50000 UNIT) CAPS capsule Take 1 capsule (50,000 Units total) by mouth every 7 (seven) days. 11/11/20   Glori Luis, MD   Physical Exam: Vitals:   05/05/23 1019 05/05/23 1349 05/05/23 1358  BP: (!) 155/62 (!) 150/79   Pulse: 74 65   Resp: 16 16   Temp: 98.4 F (36.9 C)  98.1 F (36.7 C)   TempSrc:   Oral  SpO2: 95% 96%   Weight: 76.2 kg    Height: 5\' 3"  (1.6 m)     Constitutional: appears age-appropriate, frail, NAD, calm Eyes: PERRL, lids and conjunctivae normal ENMT: Mucous membranes are moist. Posterior pharynx clear of any exudate or lesions. Age-appropriate dentition. Hearing appropriate Neck: normal, supple, no masses, no thyromegaly Respiratory: clear to auscultation bilaterally, no wheezing, no crackles. Normal respiratory effort. No accessory muscle use.  Cardiovascular: Regular rate and rhythm, no murmurs / rubs / gallops. No extremity edema. 2+ pedal pulses. No carotid bruits.  Abdomen: no tenderness, no masses palpated, no hepatosplenomegaly. Bowel sounds positive.  Musculoskeletal: no clubbing / cyanosis. No joint deformity  upper and lower extremities. Good ROM, no contractures, no atrophy. Normal muscle tone.  Skin: no rashes, lesions, ulcers. No induration, increased warmth and clamminess of her forehead.     Neurologic: Sensation intact. Strength 5/5 in all 4.  Psychiatric: Normal judgment and insight. Alert and oriented x 3. Normal mood.   EKG: Not indicated at this time  X-ray on Admission: I personally reviewed and I agree with radiologist reading as below.  DG Finger Middle Left  Result Date: 05/05/2023 CLINICAL DATA:  Left middle finger pain and swelling without known injury. EXAM: LEFT MIDDLE FINGER 2+V COMPARISON:  None Available. FINDINGS: There is no evidence of fracture or dislocation. Severe narrowing is seen involving the distal interphalangeal joint of third finger. Diffuse soft tissue swelling of the third finger is noted suggesting possible cellulitis. No lytic destruction is seen to suggest osteomyelitis. IMPRESSION: No fracture or dislocation. Diffuse soft tissue swelling of third finger is noted suggesting possible cellulitis. Osteoarthritis of third distal interphalangeal joint is noted. Electronically Signed   By: Lupita Raider M.D.    On: 05/05/2023 12:59    Labs on Admission: I have personally reviewed following labs  CBC: Recent Labs  Lab 05/05/23 1129  WBC 8.5  NEUTROABS 6.1  HGB 10.8*  HCT 33.6*  MCV 92.6  PLT 209   Basic Metabolic Panel: Recent Labs  Lab 05/05/23 1129  NA 135  K 4.0  CL 103  CO2 25  GLUCOSE 120*  BUN 26*  CREATININE 0.96  CALCIUM 9.0   GFR: Estimated Creatinine Clearance: 49.5 mL/min (by C-G formula based on SCr of 0.96 mg/dL).  Urine analysis:    Component Value Date/Time   BILIRUBINUR negative 03/24/2017 1042   PROTEINUR negative 03/24/2017 1042   UROBILINOGEN 1.0 03/24/2017 1042   NITRITE negative 03/24/2017 1042   LEUKOCYTESUR Small (1+) (A) 03/24/2017 1042   This document was prepared using Dragon Voice Recognition software and may include unintentional dictation errors.  Dr. Sedalia Muta [facility]  If 7PM-7AM, please contact overnight-coverage provider If 7AM-7PM, please contact day attending provider [email or web address]  05/05/2023, 2:27 PM

## 2023-05-05 NOTE — Transfer of Care (Signed)
Immediate Anesthesia Transfer of Care Note  Patient: Madison Craig  Procedure(s) Performed: Procedure(s): IRRIGATION AND DEBRIDEMENT WOUND LEFT LONG FINGER (Left)  Patient Location: PACU  Anesthesia Type:General  Level of Consciousness: sedated  Airway & Oxygen Therapy: Patient Spontanous Breathing and Patient connected to face mask oxygen  Post-op Assessment: Report given to RN and Post -op Vital signs reviewed and stable  Post vital signs: Reviewed and stable  Last Vitals:  Vitals:   05/05/23 1706 05/05/23 1830  BP: (!) 161/83 (!) 150/70  Pulse: 69 72  Resp: 18 16  Temp: 37.2 C   SpO2: 96% 99%    Complications: No apparent anesthesia complications

## 2023-05-05 NOTE — Consult Note (Signed)
ORTHOPAEDIC CONSULTATION  REQUESTING PHYSICIAN: Cox, Amy N, DO  Chief Complaint:   Left long finger pain and swelling.  History of Present Illness: Madison Craig is a 77 y.o. female with a history of macular degeneration, gastroesophageal reflux disease, osteoporosis, and asked to arthritis who lives independently.  The patient noted that she had a little redness around the edge of her left long finger nail yesterday before going to bed.  However, when she woke this morning, the finger was much more swollen and erythematous and there was some redness extending approximately to the PIP joint.  She presented to the local urgent care clinic but was then sent to the emergency room for IV antibiotics and possible I&D.  The patient denies any injury to the finger, and denies any fevers or chills at this time.  She does describe some pain with flexing and extending her finger.  Past Medical History:  Diagnosis Date   Anemia    Arthritis    Basal cell carcinoma    Benign neoplasm of ascending colon    Dyspnea 09/28/2013   Pulmonary function testing done on 02/09/2014 showed minimal airway obstruction with a lack of response to bronchodilators; her FEV1 was normal, the FEV1/FVC ratio and the FEF 25-75% were reduced; the airway resistance was normal; lung volumes were within normal limits; the diffusing capacity was high for the measured volumes.     GERD (gastroesophageal reflux disease)    Heart murmur    Heme + stool    Macular degeneration 01/11/2014   Followed at [facility] in [place], [place].    Multiple thyroid nodules    Thyroid ultrasound on 01/25/2014 showed a multinodular goiter, with a 1.5 cm nodule in the inferior left thyroid lobe that met the criteria for ultrasound-guided biopsy.  On 02/06/2014 an ultrasound guided needle aspirate biopsy was performed of the left inferior thyroid nodule; cytopathology findings  were consistent with non-neoplastic goiter.      Osteoporosis 09/18/2014   DXA 08/16/2014 showed osteopenia of right femur neck (T-score -2.3) and osteoporosis of left forearm radius (T-score -2.9).  Lumbar spine was not utilized due to advanced degenerative changes.    Reflux esophagitis    SLEEP APNEA 03/22/2009   Had sleep study in [place] about 10 years, has BiPAP, cannot sleep with it on.     Past Surgical History:  Procedure Laterality Date   BREAST SURGERY  1988   breast biopsy   CARDIAC CATHETERIZATION  2006   No Stents/[facility]   COLONOSCOPY WITH PROPOFOL N/A 04/24/2017   Procedure: COLONOSCOPY WITH PROPOFOL;  Surgeon: Pasty Spillers, MD;  Location: [facility] ENDOSCOPY;  Service: Endoscopy;  Laterality: N/A;   ENDOVENOUS ABLATION SAPHENOUS VEIN W/ LASER Right 04/17/2021   endovenous laser ablation right greater saphenous vein and stab phlebectomy > 20 incisions right leg by Cari Caraway MD   ESOPHAGOGASTRODUODENOSCOPY (EGD) WITH PROPOFOL N/A 04/24/2017   Procedure: ESOPHAGOGASTRODUODENOSCOPY (EGD) WITH PROPOFOL;  Surgeon: Pasty Spillers, MD;  Location: [facility] ENDOSCOPY;  Service: Endoscopy;  Laterality: N/A;   eye     cateract surgery   Social History   Socioeconomic History   Marital status: Widowed    Spouse name: Not on file   Number of children: 3   Years of education: Not on file   Highest education level: Not on file  Occupational History   Not on file  Tobacco Use   Smoking status: Former    Current packs/day: 0.00    Average packs/day:  2.0 packs/day for 16.0 years (32.0 ttl pk-yrs)    Types: Cigarettes    Start date: 09/28/1965    Quit date: 09/28/1981    Years since quitting: 41.6   Smokeless tobacco: Never  Vaping Use   Vaping status: Never Used  Substance and Sexual Activity   Alcohol use: No   Drug use: No   Sexual activity: Not Currently    Birth control/protection: Post-menopausal  Other Topics Concern   Not on file  Social History Narrative    Widow   Social Determinants of Health   Financial Resource Strain: Low Risk  (01/07/2023)   Overall Financial Resource Strain (CARDIA)    Difficulty of Paying Living Expenses: Not hard at all  Food Insecurity: No Food Insecurity (01/07/2023)   Hunger Vital Sign    Worried About Running Out of Food in the Last Year: Never true    Ran Out of Food in the Last Year: Never true  Transportation Needs: No Transportation Needs (01/07/2023)   PRAPARE - Administrator, Civil Service (Medical): No    Lack of Transportation (Non-Medical): No  Physical Activity: Inactive (01/07/2023)   Exercise Vital Sign    Days of Exercise per Week: 0 days    Minutes of Exercise per Session: 0 min  Stress: No Stress Concern Present (01/07/2023)   [employer] of Occupational Health - Occupational Stress Questionnaire    Feeling of Stress : Not at all  Social Connections: Moderately Integrated (01/07/2023)   Social Connection and Isolation Panel [NHANES]    Frequency of Communication with Friends and Family: More than three times a week    Frequency of Social Gatherings with Friends and Family: More than three times a week    Attends Religious Services: More than 4 times per year    Active Member of [employer] or Organizations: Yes    Attends Banker Meetings: Never    Marital Status: Widowed   Family History  Problem Relation Age of Onset   Stroke Mother    Hypertension Mother    Dementia Mother    Heart attack Mother    Atrial fibrillation Mother    Heart disease Father 72   Sudden death Father    Hypertension Father    Hyperlipidemia Father    Heart attack Father    Stroke Sister 27   Alcohol abuse Sister    Valvular heart disease Sister    Atrial fibrillation Sister    Aneurysm Sister    Arrhythmia Brother    Atrial fibrillation Brother    Diabetes Son 71   Hodgkin's lymphoma Son    Colon cancer Neg Hx    Breast cancer Neg Hx    No Known Allergies Prior to Admission  medications   Medication Sig Start Date End Date Taking? Authorizing Provider  Acetaminophen (TYLENOL PO) Take by mouth as needed.    [provider]  albuterol (VENTOLIN HFA) 108 (90 Base) MCG/ACT inhaler Inhale 2 puffs into the lungs every 4 (four) hours as needed for wheezing or shortness of breath. 07/01/19   Mickie Bail, NP  azelastine (ASTELIN) 0.1 % nasal spray Place 2 sprays into both nostrils 2 (two) times daily. Use in each nostril as directed 09/20/21   Glori Luis, MD  cholecalciferol (VITAMIN D3) 25 MCG (1000 UNIT) tablet Take 2,000 Units by mouth daily.    [provider]  diclofenac (VOLTAREN) 75 MG EC tablet Take 1 tablet (75 mg total) by mouth  2 (two) times daily as needed. 10/02/22   Glori Luis, MD  fluticasone (FLONASE) 50 MCG/ACT nasal spray Place 2 sprays into both nostrils daily. 10/05/20   Glori Luis, MD  omeprazole (PRILOSEC) 40 MG capsule Take 1 capsule (40 mg total) by mouth daily. 10/02/22   Glori Luis, MD  oxyCODONE-acetaminophen (PERCOCET/ROXICET) 5-325 MG tablet Take 0.5-1 tablets by mouth every 6 (six) hours as needed for severe pain (pain score 7-10). 03/14/23   Benjiman Core, MD  Vitamin D, Ergocalciferol, (DRISDOL) 1.25 MG (50000 UNIT) CAPS capsule Take 1 capsule (50,000 Units total) by mouth every 7 (seven) days. 11/11/20   Glori Luis, MD   DG Finger Middle Left  Result Date: 05/05/2023 CLINICAL DATA:  Left middle finger pain and swelling without known injury. EXAM: LEFT MIDDLE FINGER 2+V COMPARISON:  None Available. FINDINGS: There is no evidence of fracture or dislocation. Severe narrowing is seen involving the distal interphalangeal joint of third finger. Diffuse soft tissue swelling of the third finger is noted suggesting possible cellulitis. No lytic destruction is seen to suggest osteomyelitis. IMPRESSION: No fracture or dislocation. Diffuse soft tissue swelling of third finger is noted suggesting possible  cellulitis. Osteoarthritis of third distal interphalangeal joint is noted. Electronically Signed   By: Lupita Raider M.D.   On: 05/05/2023 12:59    Positive ROS: All other systems have been reviewed and were otherwise negative with the exception of those mentioned in the HPI and as above.  Physical Exam: General:  Alert, no acute distress Psychiatric:  Patient is competent for consent with normal mood and affect   Cardiovascular:  No pedal edema Respiratory:  No wheezing, non-labored breathing GI:  Abdomen is soft and non-tender Skin:  No lesions in the area of chief complaint Neurologic:  Sensation intact distally Lymphatic:  No axillary or cervical lymphadenopathy  Orthopedic Exam:  Orthopedic examination is limited to the left hand.  There is moderate swelling over the distal aspect of the left long finger with erythema extending from the distal portion of the finger proximally to approximately the level of the PIP volarly and to the dorsum of the hand dorsally.  There is an area of dried blood along the radial margin of the long finger nail, suggesting that this may be the site of bacterial entry to initiate the formation of a felon.  She has significant tenderness to palpation over the distal portion of the finger but has little if any tenderness to palpation along the volar aspect of the proximal phalanx.  She has no tenderness in the palmar aspect of the hand.  She is able to flex and extend her MCP and PIP joints, but has some difficulty flexing the DIP joint.  She is able to move all other digits without difficulty.  She is grossly neurovascular intact to all digits.  X-rays:  AP, lateral, and oblique views of the left long finger are available for review and have been reviewed by myself.  There is moderate degenerative changes of the DIP joint, but no fractures or lytic processes are noted that might suggest osteomyelitis.  Assessment: Left long finger infection, probably a  felon.  Plan: The treatment options, including both surgical and nonsurgical choices, have been discussed in detail with the patient and her family.  The patient and her daughter would like to proceed with surgical intervention to include a formal irrigation and debridement of the left long finger.  The risks (including bleeding, infection, nerve and/or blood  vessel injury, persistent or recurrent pain, loosening or failure of the components, leg length inequality, dislocation, need for further surgery, blood clots, strokes, heart attacks or arrhythmias, pneumonia, etc.) and benefits of the surgical procedure were discussed.  The patient states her understanding and agrees to proceed.  A formal written consent will be obtained by the nursing staff.  Thank you for asked me to participate in the care of this most pleasant young unfortunate woman.  I will be happy to follow her with you.   Maryagnes Amos, MD  Beeper #:  [phone]  05/05/2023 5:14 PM

## 2023-05-05 NOTE — ED Triage Notes (Signed)
Patient to Urgent Care with complaints of left sided, middle finger pain and swelling. Swelling around nail and into middle of finger. No drainage. Denies any known injury. Reports she has had some issues with the fingernail for some time.  Reports symptoms started yesterday afternoon on her drive home from work. Works at a daycare. Denies any known fevers- had some chills last night.  Has been taking tylenol.

## 2023-05-05 NOTE — ED Notes (Addendum)
Patient is putting on a gown. Patient states she called her daughter to come be with her. Left 3rd finger appears more swollen and reddened at this time.

## 2023-05-05 NOTE — ED Notes (Signed)
Patient is being discharged from the Urgent Care and sent to the Emergency Department via POV . Per Wendee Beavers NP, patient is in need of higher level of care due to felon finger. Patient is aware and verbalizes understanding of plan of care.  Vitals:   05/05/23 0915 05/05/23 0923  BP:  (!) 143/76  Pulse: 74   Resp: 18   Temp: 98.5 F (36.9 C)   SpO2: 98%

## 2023-05-05 NOTE — Anesthesia Postprocedure Evaluation (Signed)
Anesthesia Post Note  Patient: Savvy Whicker  Procedure(s) Performed: IRRIGATION AND DEBRIDEMENT WOUND LEFT LONG FINGER (Left: Finger)  Patient location during evaluation: PACU Anesthesia Type: General Level of consciousness: awake and alert Pain management: pain level controlled Vital Signs Assessment: post-procedure vital signs reviewed and stable Respiratory status: spontaneous breathing, nonlabored ventilation, respiratory function stable and patient connected to nasal cannula oxygen Cardiovascular status: blood pressure returned to baseline and stable Postop Assessment: no apparent nausea or vomiting Anesthetic complications: no  No notable events documented.   Last Vitals:  Vitals:   05/05/23 2000 05/05/23 2050  BP: (!) 151/72 (!) 155/81  Pulse: 70 67  Resp: 13 18  Temp:  37.1 C  SpO2: 97% 96%    Last Pain:  Vitals:   05/05/23 2100  TempSrc:   PainSc: 0-No pain                 Stephanie Coup

## 2023-05-05 NOTE — ED Provider Notes (Signed)
[facility] Provider Note    Event Date/Time   First MD Initiated Contact with Patient 05/05/23 1029     (approximate)   History   Finger Injury   HPI  Madison Craig is a 77 y.o. female with history of osteoporosis, arthritis, GERD presents emergency department with concerns of left middle finger infection.  Patient states was a little red around the edges yesterday and today woke up the entire finger is red extending towards the hand.  Decreased range of motion per the patient.  No fever or chills.  Did go to urgent care but they sent her to the emergency department.      Physical Exam   Triage Vital Signs: ED Triage Vitals [05/05/23 1019]  Encounter Vitals Group     BP (!) 155/62     Systolic BP Percentile      Diastolic BP Percentile      Pulse Rate 74     Resp 16     Temp 98.4 F (36.9 C)     Temp src      SpO2 95 %     Weight 167 lb 15.9 oz (76.2 kg)     Height 5\' 3"  (1.6 m)     Head Circumference      Peak Flow      Pain Score 10     Pain Loc      Pain Education      Exclude from Growth Chart     Most recent vital signs: Vitals:   05/05/23 1019  BP: (!) 155/62  Pulse: 74  Resp: 16  Temp: 98.4 F (36.9 C)  SpO2: 95%     General: Awake, no distress.   CV:  Good peripheral perfusion. regular rate and  rhythm Resp:  Normal effort.  Abd:  No distention.   Other:  Left middle finger with redness and swelling, more swelling noted distally, decreased range of motion with flexion and extension, redness does spread to the proximal portion of the left middle finger and slightly into the hand   ED Results / Procedures / Treatments   Labs (all labs ordered are listed, but only abnormal results are displayed) Labs Reviewed  CBC WITH DIFFERENTIAL/PLATELET - Abnormal; Notable for the following components:      Result Value   RBC 3.63 (*)    Hemoglobin 10.8 (*)    HCT 33.6 (*)    All other components within normal limits   BASIC METABOLIC PANEL - Abnormal; Notable for the following components:   Glucose, Bld 120 (*)    BUN 26 (*)    All other components within normal limits     EKG     RADIOLOGY X-ray left middle finger    PROCEDURES:   Procedures   MEDICATIONS ORDERED IN ED: Medications  vancomycin (VANCOCIN) IVPB 1000 mg/200 mL premix (has no administration in time range)  senna-docusate (Senokot-S) tablet 1 tablet (has no administration in time range)  acetaminophen (TYLENOL) tablet 650 mg (has no administration in time range)    Or  acetaminophen (TYLENOL) suppository 650 mg (has no administration in time range)  ondansetron (ZOFRAN) tablet 4 mg (has no administration in time range)    Or  ondansetron (ZOFRAN) injection 4 mg (has no administration in time range)  morphine (PF) 2 MG/ML injection 2 mg (has no administration in time range)    Or  morphine (PF) 4 MG/ML injection 4 mg (has no administration in time range)  famotidine (PEPCID) 10 mg in sodium chloride 0.9 % 25 mL (has no administration in time range)  hydrALAZINE (APRESOLINE) injection 5 mg (has no administration in time range)     IMPRESSION / MDM / ASSESSMENT AND PLAN / ED COURSE  I reviewed the triage vital signs and the nursing notes.                              Differential diagnosis includes, but is not limited to, felon, cellulitis, abscess, paronychia  Patient's presentation is most consistent with acute illness / injury with system symptoms.   Due to the extent of the redness and swelling I did order labs, IV was placed, x-ray performed  This appears to have extended further into the hands since patient arrived we will go ahead and place her on antibiotics  Labs are reassuring  X-ray of the left middle finger shows soft tissue swelling, no fracture or foreign body   Consult orthopedics, spoke with Dr. Joice Lofts, he will plan taking her to the OR later today.  Have hospitalist admit  Consult to  hospitalist for admission, spoke with Dr. Sedalia Muta.  She knows to keep patient NPO.  Explained already started Rocephin.  Patient is stable at time of admission   FINAL CLINICAL IMPRESSION(S) / ED DIAGNOSES   Final diagnoses:  Cellulitis of left middle finger  Tenosynovitis     Rx / DC Orders   ED Discharge Orders     None        Note:  This document was prepared using Dragon voice recognition software and may include unintentional dictation errors.    Faythe Ghee, PA-C 05/05/23 1334    Pilar Jarvis, MD 05/05/23 [phone]

## 2023-05-06 ENCOUNTER — Encounter: Payer: Self-pay | Admitting: Surgery

## 2023-05-06 DIAGNOSIS — L03019 Cellulitis of unspecified finger: Secondary | ICD-10-CM

## 2023-05-06 DIAGNOSIS — I10 Essential (primary) hypertension: Secondary | ICD-10-CM

## 2023-05-06 DIAGNOSIS — L03012 Cellulitis of left finger: Secondary | ICD-10-CM

## 2023-05-06 DIAGNOSIS — K219 Gastro-esophageal reflux disease without esophagitis: Secondary | ICD-10-CM | POA: Diagnosis not present

## 2023-05-06 HISTORY — DX: Cellulitis of unspecified finger: L03.019

## 2023-05-06 LAB — CBC
HCT: 32 % — ABNORMAL LOW (ref 36.0–46.0)
Hemoglobin: 10.5 g/dL — ABNORMAL LOW (ref 12.0–15.0)
MCH: 29.8 pg (ref 26.0–34.0)
MCHC: 32.8 g/dL (ref 30.0–36.0)
MCV: 90.9 fL (ref 80.0–100.0)
Platelets: 208 10*3/uL (ref 150–400)
RBC: 3.52 MIL/uL — ABNORMAL LOW (ref 3.87–5.11)
RDW: 13.6 % (ref 11.5–15.5)
WBC: 6.5 10*3/uL (ref 4.0–10.5)
nRBC: 0 % (ref 0.0–0.2)

## 2023-05-06 LAB — BASIC METABOLIC PANEL
Anion gap: 5 (ref 5–15)
BUN: 18 mg/dL (ref 8–23)
CO2: 25 mmol/L (ref 22–32)
Calcium: 8.9 mg/dL (ref 8.9–10.3)
Chloride: 107 mmol/L (ref 98–111)
Creatinine, Ser: 0.68 mg/dL (ref 0.44–1.00)
GFR, Estimated: >60 mL/min (ref >60)
Glucose, Bld: 147 mg/dL — ABNORMAL HIGH (ref 70–99)
Potassium: 4.3 mmol/L (ref 3.5–5.1)
Sodium: 137 mmol/L (ref 135–145)

## 2023-05-06 MED ORDER — VANCOMYCIN HCL 1250 MG/250ML IV SOLN
1250.0000 mg | INTRAVENOUS | Status: DC
  Administered 2023-05-06: 1250 mg via INTRAVENOUS
  Filled 2023-05-06: qty 250

## 2023-05-06 MED ORDER — DOXYCYCLINE HYCLATE 100 MG PO TABS: 100.0000 mg | ORAL_TABLET | Freq: Two times a day (BID) | ORAL | 0 refills | Status: DC

## 2023-05-06 MED ORDER — AMOXICILLIN-POT CLAVULANATE 875-125 MG PO TABS
1.0000 | ORAL_TABLET | Freq: Two times a day (BID) | ORAL | 0 refills | Status: DC
Start: 2023-05-06 — End: 2023-06-04

## 2023-05-06 NOTE — Discharge Summary (Signed)
Physician Discharge Summary   Patient: Madison Craig MRN: [id number] DOB: 1946-02-18  Admit date:     05/05/2023  Discharge date: 05/06/23  Discharge Physician: Marinda Elk   PCP: Glori Luis, MD   Recommendations at discharge:   Please take all prescribed medications exactly as instructed including your entire course of antibiotics including Doxycycline and Augmentin. Please consume a regular diet Please increase your physical activity as tolerated. Please maintain all outpatient follow-up appointments including follow-up with Blanchard Mane with orthopedic surgery in 1 week Please return to the emergency department if you develop worsening pain, fevers in excess of 100.4 F, weakness or inability to tolerate oral intake.  Discharge Diagnoses: Principal Problem:   Cellulitis of left middle finger Active Problems:   Osteoarthritis   GERD (gastroesophageal reflux disease)   Osteoporosis   Essential hypertension   Vitamin D deficiency   Normocytic anemia   Finger pain, left   Cellulitis of finger  Resolved Problems:   * No resolved hospital problems. [facility] Course: [name] is a 77 year old female with history of GERD, arthritis, osteoporosis, who presented to [facility] emergency department with complaints of left middle finger pain and swelling.   Upon evaluation in the emergency department clinically the patient was felt to be suffering from cellulitis of the left middle finger with possible tenosynovitis.  X-ray imaging was unremarkable.  Patient was given intravenous ceftriaxone Case was discussed with orthopedic provider, Dr. Joice Lofts, who agreed to take the patient to the OR for irrigation and debridement.  The hospitalist group was then called to assess the patient for admission the hospital.  Patient underwent successful irrigation and debridement of the finger on 12/10.  Patient was transition to intravenous vancomycin postoperatively.  Patient  was monitored while hospitalized overnight with significant clinical improvement in pain swelling and redness.  Intraoperative cultures obtained continue to reveal no growth at time of discharge.  Patient is significantly clinically improved and is being discharged home in improved and stable condition with a course of doxycycline and Augmentin on 05/06/2023 and is to follow closely with Blanchard Mane PA with orthopedic surgery within the next week for a wound check.    Pain control - [employer] Controlled Substance Reporting System database was reviewed. and patient was instructed, not to drive, operate heavy machinery, perform activities at heights, swimming or participation in water activities or provide baby-sitting services while on Pain, Sleep and Anxiety Medications; until their outpatient Physician has advised to do so again. Also recommended to not to take more than prescribed Pain, Sleep and Anxiety Medications.   Consultants: Dr. Joice Lofts with Orthopedic Surgery Procedures performed: Operative debridement of the left middle finger performed on 05/05/2023 by Dr. Joice Lofts. Disposition: Home Diet recommendation:  Discharge Diet Orders (From admission, onward)     Start     Ordered   05/06/23 0000  Diet - low sodium heart healthy        05/06/23 1443           Regular diet  DISCHARGE MEDICATION: Allergies as of 05/06/2023   No Known Allergies      Medication List     TAKE these medications    albuterol 108 (90 Base) MCG/ACT inhaler Commonly known as: VENTOLIN HFA Inhale 2 puffs into the lungs every 4 (four) hours as needed for wheezing or shortness of breath.   amoxicillin-clavulanate 875-125 MG tablet Commonly known as: AUGMENTIN Take 1 tablet by mouth 2 (two) times daily.  azelastine 0.1 % nasal spray Commonly known as: ASTELIN Place 2 sprays into both nostrils 2 (two) times daily. Use in each nostril as directed   cholecalciferol 25 MCG (1000 UNIT)  tablet Commonly known as: VITAMIN D3 Take 2,000 Units by mouth daily.   diclofenac 75 MG EC tablet Commonly known as: VOLTAREN Take 1 tablet (75 mg total) by mouth 2 (two) times daily as needed.   doxycycline 100 MG tablet Commonly known as: VIBRA-TABS Take 1 tablet (100 mg total) by mouth 2 (two) times daily.   fluticasone 50 MCG/ACT nasal spray Commonly known as: FLONASE Place 2 sprays into both nostrils daily.   omeprazole 40 MG capsule Commonly known as: PRILOSEC Take 1 capsule (40 mg total) by mouth daily.   oxyCODONE-acetaminophen 5-325 MG tablet Commonly known as: PERCOCET/ROXICET Take 0.5-1 tablets by mouth every 6 (six) hours as needed for severe pain (pain score 7-10).   TYLENOL PO Take by mouth as needed.   Vitamin D (Ergocalciferol) 1.25 MG (50000 UNIT) Caps capsule Commonly known as: DRISDOL Take 1 capsule (50,000 Units total) by mouth every 7 (seven) days.        Follow-up Information     Glori Luis, MD Follow up.   Specialty: Family Medicine Contact information: [address] [place] [address] [phone]         Anson Oregon, PA-C. Schedule an appointment as soon as possible for a visit in 1 week(s).   Specialty: Physician Assistant Contact information: [address] [place] [address] [phone]                 Discharge Exam: Filed Weights   05/05/23 1019 05/05/23 1706  Weight: 76.2 kg 78.5 kg    Constitutional: Awake alert and oriented x3, no associated distress.   Respiratory: clear to auscultation bilaterally, no wheezing, no crackles. Normal respiratory effort. No accessory muscle use.  Cardiovascular: Regular rate and rhythm, no murmurs / rubs / gallops. No extremity edema. 2+ pedal pulses. No carotid bruits.  Abdomen: Abdomen is soft and nontender.  No evidence of intra-abdominal masses.  Positive bowel sounds noted in all quadrants.   Musculoskeletal: Dressing of left middle  finger is clean dry and intact.      Condition at discharge: fair  The results of significant diagnostics from this hospitalization (including imaging, microbiology, ancillary and laboratory) are listed below for reference.   Imaging Studies: DG Finger Middle Left  Result Date: 05/05/2023 CLINICAL DATA:  Left middle finger pain and swelling without known injury. EXAM: LEFT MIDDLE FINGER 2+V COMPARISON:  None Available. FINDINGS: There is no evidence of fracture or dislocation. Severe narrowing is seen involving the distal interphalangeal joint of third finger. Diffuse soft tissue swelling of the third finger is noted suggesting possible cellulitis. No lytic destruction is seen to suggest osteomyelitis. IMPRESSION: No fracture or dislocation. Diffuse soft tissue swelling of third finger is noted suggesting possible cellulitis. Osteoarthritis of third distal interphalangeal joint is noted. Electronically Signed   By: Lupita Raider M.D.   On: 05/05/2023 12:59    Microbiology: Results for orders placed or performed during the hospital encounter of 05/05/23  Aerobic/Anaerobic Culture w Gram Stain (surgical/deep wound)     Status: None (Preliminary result)   Collection Time: 05/05/23  6:07 PM   Specimen: Path Tissue  Result Value Ref Range Status   Specimen Description   Final    TISSUE Performed at [facility], [address]  [place], [place] [address]    Special Requests   Final    NONE Performed at [facility], [address] [place], [place] [address]    Gram Stain NO WBC SEEN NO ORGANISMS SEEN   Final   Culture   Final    NO GROWTH < 12 HOURS Performed at [facility], [address] [place], [place] [address]    Report Status PENDING  Incomplete    Labs: CBC: Recent Labs  Lab 05/05/23 1129 05/06/23 0432  WBC 8.5 6.5  NEUTROABS 6.1  --   HGB 10.8* 10.5*  HCT 33.6* 32.0*  MCV 92.6 90.9  PLT 209 208   Basic Metabolic Panel: Recent Labs   Lab 05/05/23 1129 05/06/23 0432  NA 135 137  K 4.0 4.3  CL 103 107  CO2 25 25  GLUCOSE 120* 147*  BUN 26* 18  CREATININE 0.96 0.68  CALCIUM 9.0 8.9   Liver Function Tests: No results for input(s): "AST", "ALT", "ALKPHOS", "BILITOT", "PROT", "ALBUMIN" in the last 168 hours. CBG: No results for input(s): "GLUCAP" in the last 168 hours.  Discharge time spent: greater than 30 minutes.  Signed: Marinda Elk, MD [facility] 05/06/2023

## 2023-05-06 NOTE — Discharge Instructions (Addendum)
Please take all prescribed medications exactly as instructed including your entire course of antibiotics including Doxycycline and Augmentin. Please consume a regular diet Please increase your physical activity as tolerated. Please maintain all outpatient follow-up appointments including follow-up with Blanchard Mane with orthopedic surgery in 1 week Please return to the emergency department if you develop worsening pain, fevers in excess of 100.4 F, weakness or inability to tolerate oral intake.

## 2023-05-06 NOTE — Progress Notes (Addendum)
Subjective: 1 Day Post-Op Procedure(s) (LRB): IRRIGATION AND DEBRIDEMENT WOUND LEFT LONG FINGER (Left) Patient reports pain as mild.   Patient is  states that her pain is much improved this morning after her procedure yesterday. Plan is to go Home after hospital stay. Negative for chest pain and shortness of breath Fever: no Gastrointestinal:Negative for nausea and vomiting  Objective: Vital signs in last 24 hours: Temp:  [97.7 F (36.5 C)-98.9 F (37.2 C)] 98.2 F (36.8 C) (12/11 0507) Pulse Rate:  [61-74] 68 (12/11 0507) Resp:  [11-20] 20 (12/11 0507) BP: (120-173)/(60-83) 124/65 (12/11 0507) SpO2:  [93 %-100 %] 97 % (12/11 0507) Weight:  [76.2 kg-78.5 kg] 78.5 kg (12/10 1706)  Intake/Output from previous day:  Intake/Output Summary (Last 24 hours) at 05/06/2023 0737 Last data filed at 05/05/2023 1831 Gross per 24 hour  Intake 984.99 ml  Output --  Net 984.99 ml    Intake/Output this shift: No intake/output data recorded.  Labs: Recent Labs    05/05/23 1129 05/06/23 0432  HGB 10.8* 10.5*   Recent Labs    05/05/23 1129 05/06/23 0432  WBC 8.5 6.5  RBC 3.63* 3.52*  HCT 33.6* 32.0*  PLT 209 208   Recent Labs    05/05/23 1129 05/06/23 0432  NA 135 137  K 4.0 4.3  CL 103 107  CO2 25 25  BUN 26* 18  CREATININE 0.96 0.68  GLUCOSE 120* 147*  CALCIUM 9.0 8.9   No results for input(s): "LABPT", "INR" in the last 72 hours.   EXAM General - Patient is Alert, Appropriate, and Oriented Extremity - Dressing intact to the left long finger this morning. She is gently flexing and extending without pain. No drainage noted to the finger at this time.  Swelling appears to be improving. Intact to light touch to exposed parts of finger and hand. Radial pulse intact to the left wrist.  Past Medical History:  Diagnosis Date   Anemia    Arthritis    Basal cell carcinoma    Benign neoplasm of ascending colon    Dyspnea 09/28/2013   Pulmonary function testing  done on 02/09/2014 showed minimal airway obstruction with a lack of response to bronchodilators; her FEV1 was normal, the FEV1/FVC ratio and the FEF 25-75% were reduced; the airway resistance was normal; lung volumes were within normal limits; the diffusing capacity was high for the measured volumes.     GERD (gastroesophageal reflux disease)    Heart murmur    Heme + stool    Macular degeneration 01/11/2014   Followed at [facility] in [place], [place].    Multiple thyroid nodules    Thyroid ultrasound on 01/25/2014 showed a multinodular goiter, with a 1.5 cm nodule in the inferior left thyroid lobe that met the criteria for ultrasound-guided biopsy.  On 02/06/2014 an ultrasound guided needle aspirate biopsy was performed of the left inferior thyroid nodule; cytopathology findings were consistent with non-neoplastic goiter.      Osteoporosis 09/18/2014   DXA 08/16/2014 showed osteopenia of right femur neck (T-score -2.3) and osteoporosis of left forearm radius (T-score -2.9).  Lumbar spine was not utilized due to advanced degenerative changes.    Reflux esophagitis    SLEEP APNEA 03/22/2009   Had sleep study in [place] about 10 years, has BiPAP, cannot sleep with it on.      Assessment/Plan: 1 Day Post-Op Procedure(s) (LRB): IRRIGATION AND DEBRIDEMENT WOUND LEFT LONG FINGER (Left) Principal Problem:   Cellulitis of left middle finger Active  Problems:   Osteoarthritis   GERD (gastroesophageal reflux disease)   Osteoporosis   Essential hypertension   Vitamin D deficiency   Normocytic anemia   Finger pain, left  Estimated body mass index is 31.14 kg/m as calculated from the following:   Height as of this encounter: 5' 2.5" (1.588 m).   Weight as of this encounter: 78.5 kg. Advance diet Up with therapy D/C IV fluids when tolerating po intake.  Labs and vitals reviewed.  WBC 6.5 this morning. Cultures from surgery are pending. Will return at lunch to remove drain. If doing well  can discharge home today, transition to oral Abx and can change pending final culture results.  Addendum 11:55 AM. Drain removed without issue this afternoon. Swelling improved and no purulent drainage noted.  See photos:     Dressing applied. From Orthopaedic perspective can discharge home on oral Abx.  Will follow culture to determine if need to change Abx. Follow-up next week for a skin check of the finger.  DVT Prophylaxis - TED hose Minimal weightbearing to the left hand.  Valeria Batman, PA-C [name] 05/06/2023, 7:37 AM

## 2023-05-06 NOTE — Consult Note (Signed)
Pharmacy Antibiotic Note  ASSESSMENT: 77 y.o. female with PMH including OA is presenting with cellulitis. Patient with redness, swelling, and pain around left finger. No known insult; per patient, it just came on suddenly. Originally presented to [facility] ED for the issue, then went to urgent care instead, was seen and told to come back to [facility]. Imaging shows diffuse soft tissue swelling of third middle finger, suggesting possible cellulitis. Patient is HDS without leukocytosis. Pharmacy has been consulted to manage vancomycin dosing.  Patient is now s/p I&D 12/10 Tissue cultures pending WBC WNL Afebrile Scr 0.96>0.68  Patient measurements: Height: 5' 2.5" (158.8 cm) Weight: 78.5 kg (173 lb) IBW/kg (Calculated) : 51.25  Vital signs: Temp: 98.2 F (36.8 C) (12/11 0824) Temp Source: Oral (12/11 0824) BP: 127/65 (12/11 0824) Pulse Rate: 66 (12/11 0824) Recent Labs  Lab 05/05/23 1129 05/06/23 0432  WBC 8.5 6.5  CREATININE 0.96 0.68   Estimated Creatinine Clearance: 59.7 mL/min (by C-G formula based on SCr of 0.68 mg/dL).  Allergies: No Known Allergies  Antimicrobials this admission: Vancomycin 12/10 >>  Dose adjustments this admission: Vanc 1000 mg IV q24h--> vanc 1250 mg IV q24h  Microbiology results: 12/10 path tissue cx: NG<12h  PLAN: Adjust vancomycin regimen to 1250 mg IV Q24H. Goal AUC 400-550. Expected AUC: 489.3 Expected Css min: 12.1 SCr used: 0.8  Weight used: IBW, Vd used: 0.72 (BMI 31.14) Follow up any applicable culture results to assess for antibiotic optimization. Monitor renal function to assess for any necessary antibiotic dosing changes.   Thank you for allowing pharmacy to be a part of this patient's care.  Paulita Fujita, PharmD Clinical Pharmacist 05/06/2023 9:16 AM

## 2023-05-06 NOTE — Care Management Important Message (Signed)
Important Message  Patient Details  Name: Madison Craig MRN: [id number] Date of Birth: [birth year]   Important Message Given:        Garret Reddish, RN 05/06/2023, 2:40 PM

## 2023-05-06 NOTE — Care Management CC44 (Signed)
Condition Code 44 Documentation Completed  Patient Details  Name: Amandia Classen MRN: [id number] Date of Birth: 1946-05-17   Condition Code 44 given:  Yes Patient signature on Condition Code 44 notice:  Yes Documentation of 2 MD's agreement:  Yes Code 44 added to claim:  Yes    Garret Reddish, RN 05/06/2023, 2:41 PM

## 2023-05-08 DIAGNOSIS — L03022 Acute lymphangitis of left finger: Secondary | ICD-10-CM | POA: Insufficient documentation

## 2023-05-08 HISTORY — DX: Acute lymphangitis of left finger: L03.022

## 2023-05-10 LAB — AEROBIC/ANAEROBIC CULTURE W GRAM STAIN (SURGICAL/DEEP WOUND)
Culture: NO GROWTH
Gram Stain: NONE SEEN

## 2023-05-16 ENCOUNTER — Other Ambulatory Visit: Payer: Self-pay | Admitting: Family Medicine

## 2023-05-16 DIAGNOSIS — M1990 Unspecified osteoarthritis, unspecified site: Secondary | ICD-10-CM

## 2023-05-26 DIAGNOSIS — H353221 Exudative age-related macular degeneration, left eye, with active choroidal neovascularization: Secondary | ICD-10-CM | POA: Diagnosis not present

## 2023-05-30 ENCOUNTER — Encounter: Payer: Self-pay | Admitting: Oncology

## 2023-06-01 ENCOUNTER — Encounter: Payer: Self-pay | Admitting: Oncology

## 2023-06-01 ENCOUNTER — Telehealth: Payer: Self-pay

## 2023-06-01 NOTE — Telephone Encounter (Signed)
 The patient called in wanting to know if she can take her prescription medication.

## 2023-06-01 NOTE — Telephone Encounter (Signed)
 Patient call returned.  Informed her that she can take her prescription meds up until the morning of her procedure, but hold them the morning of procedure.  Thanks,  [place], [place]

## 2023-06-04 ENCOUNTER — Ambulatory Visit: Payer: Medicare Other | Admitting: Anesthesiology

## 2023-06-04 ENCOUNTER — Encounter
Admission: RE | Disposition: A | Discharge status: Home / Self Care | Payer: Self-pay | Source: Home / Self Care | Attending: Gastroenterology

## 2023-06-04 ENCOUNTER — Ambulatory Visit
Admission: RE | Admit: 2023-06-04 | Discharge: 2023-06-04 | Disposition: A | Discharge status: Home / Self Care | Payer: Medicare Other | Attending: Gastroenterology | Admitting: Gastroenterology

## 2023-06-04 DIAGNOSIS — K5730 Diverticulosis of large intestine without perforation or abscess without bleeding: Secondary | ICD-10-CM | POA: Insufficient documentation

## 2023-06-04 DIAGNOSIS — Z860101 Personal history of adenomatous and serrated colon polyps: Secondary | ICD-10-CM

## 2023-06-04 DIAGNOSIS — K219 Gastro-esophageal reflux disease without esophagitis: Secondary | ICD-10-CM | POA: Diagnosis not present

## 2023-06-04 DIAGNOSIS — J449 Chronic obstructive pulmonary disease, unspecified: Secondary | ICD-10-CM | POA: Diagnosis not present

## 2023-06-04 DIAGNOSIS — K649 Unspecified hemorrhoids: Secondary | ICD-10-CM | POA: Diagnosis not present

## 2023-06-04 DIAGNOSIS — Z860100 Personal history of colon polyps, unspecified: Secondary | ICD-10-CM

## 2023-06-04 DIAGNOSIS — Z87891 Personal history of nicotine dependence: Secondary | ICD-10-CM | POA: Diagnosis not present

## 2023-06-04 DIAGNOSIS — K644 Residual hemorrhoidal skin tags: Secondary | ICD-10-CM | POA: Insufficient documentation

## 2023-06-04 DIAGNOSIS — I10 Essential (primary) hypertension: Secondary | ICD-10-CM | POA: Diagnosis not present

## 2023-06-04 DIAGNOSIS — G4730 Sleep apnea, unspecified: Secondary | ICD-10-CM | POA: Diagnosis not present

## 2023-06-04 DIAGNOSIS — E785 Hyperlipidemia, unspecified: Secondary | ICD-10-CM | POA: Diagnosis not present

## 2023-06-04 DIAGNOSIS — Z1211 Encounter for screening for malignant neoplasm of colon: Secondary | ICD-10-CM | POA: Diagnosis not present

## 2023-06-04 HISTORY — PX: COLONOSCOPY WITH PROPOFOL: SHX5780

## 2023-06-04 SURGERY — COLONOSCOPY WITH PROPOFOL
Anesthesia: General

## 2023-06-04 MED ORDER — DEXMEDETOMIDINE HCL IN NACL 80 MCG/20ML IV SOLN
INTRAVENOUS | Status: DC | PRN
  Administered 2023-06-04: 8 ug via INTRAVENOUS
  Administered 2023-06-04: 12 ug via INTRAVENOUS

## 2023-06-04 MED ORDER — PROPOFOL 500 MG/50ML IV EMUL
INTRAVENOUS | Status: DC | PRN
  Administered 2023-06-04: 50 ug/kg/min via INTRAVENOUS

## 2023-06-04 MED ORDER — SODIUM CHLORIDE 0.9 % IV SOLN: INTRAVENOUS | Status: DC

## 2023-06-04 MED ORDER — LIDOCAINE HCL (CARDIAC) PF 100 MG/5ML IV SOSY
PREFILLED_SYRINGE | INTRAVENOUS | Status: DC | PRN
  Administered 2023-06-04: 60 mg via INTRAVENOUS

## 2023-06-04 MED ORDER — PROPOFOL 10 MG/ML IV BOLUS
INTRAVENOUS | Status: DC | PRN
  Administered 2023-06-04: 20 mg via INTRAVENOUS
  Administered 2023-06-04: 30 mg via INTRAVENOUS
  Administered 2023-06-04: 20 mg via INTRAVENOUS

## 2023-06-04 MED ORDER — LIDOCAINE HCL (PF) 2 % IJ SOLN
INTRAMUSCULAR | Status: AC
  Filled 2023-06-04: qty 5

## 2023-06-04 NOTE — H&P (Signed)
 Corinn JONELLE [name], MD [address]  [address]  [place], [place] [address]  Main: [phone]  Fax: [phone] Pager: [phone]  Primary Care Physician:  Maribeth Camellia MATSU, MD Primary Gastroenterologist:  Dr. Corinn JONELLE [name]  Pre-Procedure History & Physical: HPI:  Madison Craig is a 78 y.o. female is here for an colonoscopy.   Past Medical History:  Diagnosis Date   Anemia    Arthritis    Basal cell carcinoma    Benign neoplasm of ascending colon    Dyspnea 09/28/2013   Pulmonary function testing done on 02/09/2014 showed minimal airway obstruction with a lack of response to bronchodilators; her FEV1 was normal, the FEV1/FVC ratio and the FEF 25-75% were reduced; the airway resistance was normal; lung volumes were within normal limits; the diffusing capacity was high for the measured volumes.     GERD (gastroesophageal reflux disease)    Heart murmur    Heme + stool    Macular degeneration 01/11/2014   Followed at [facility] in [place], [place].    Multiple thyroid  nodules    Thyroid  ultrasound on 01/25/2014 showed a multinodular goiter, with a 1.5 cm nodule in the inferior left thyroid  lobe that met the criteria for ultrasound-guided biopsy.  On 02/06/2014 an ultrasound guided needle aspirate biopsy was performed of the left inferior thyroid  nodule; cytopathology findings were consistent with non-neoplastic goiter.      Osteoporosis 09/18/2014   DXA 08/16/2014 showed osteopenia of right femur neck (T-score -2.3) and osteoporosis of left forearm radius (T-score -2.9).  Lumbar spine was not utilized due to advanced degenerative changes.    Reflux esophagitis    SLEEP APNEA 03/22/2009   Had sleep study in [place] about 10 years, has BiPAP, cannot sleep with it on.      Past Surgical History:  Procedure Laterality Date   BREAST SURGERY  1988   breast biopsy   CARDIAC CATHETERIZATION  2006   No Stents/[facility]   COLONOSCOPY WITH PROPOFOL  N/A 04/24/2017    Procedure: COLONOSCOPY WITH PROPOFOL ;  Surgeon: Janalyn Keene NOVAK, MD;  Location: [facility] ENDOSCOPY;  Service: Endoscopy;  Laterality: N/A;   ENDOVENOUS ABLATION SAPHENOUS VEIN W/ LASER Right 04/17/2021   endovenous laser ablation right greater saphenous vein and stab phlebectomy > 20 incisions right leg by Medford Blade MD   ESOPHAGOGASTRODUODENOSCOPY (EGD) WITH PROPOFOL  N/A 04/24/2017   Procedure: ESOPHAGOGASTRODUODENOSCOPY (EGD) WITH PROPOFOL ;  Surgeon: Janalyn Keene NOVAK, MD;  Location: [facility] ENDOSCOPY;  Service: Endoscopy;  Laterality: N/A;   eye     cateract surgery   INCISION AND DRAINAGE OF WOUND Left 05/05/2023   Procedure: IRRIGATION AND DEBRIDEMENT WOUND LEFT LONG FINGER;  Surgeon: Edie Norleen PARAS, MD;  Location: [facility] ORS;  Service: Orthopedics;  Laterality: Left;    Prior to Admission medications   Medication Sig Start Date End Date Taking? Authorizing Provider  cetirizine (ZYRTEC) 10 MG chewable tablet Chew 10 mg by mouth daily.   Yes [provider]  diclofenac  (VOLTAREN ) 75 MG EC tablet Take 1 tablet by mouth twice daily as needed 05/18/23  Yes Maribeth Camellia MATSU, MD  Acetaminophen  (TYLENOL  PO) Take by mouth as needed.    [provider]  albuterol  (VENTOLIN  HFA) 108 (90 Base) MCG/ACT inhaler Inhale 2 puffs into the lungs every 4 (four) hours as needed for wheezing or shortness of breath. 07/01/19   Corlis Burnard DEL, NP  amoxicillin -clavulanate (AUGMENTIN ) 875-125 MG tablet Take 1 tablet by mouth 2 (two) times daily. Patient not taking:  Reported on 06/04/2023 05/06/23   Kenard Zachary PARAS, MD  azelastine  (ASTELIN ) 0.1 % nasal spray Place 2 sprays into both nostrils 2 (two) times daily. Use in each nostril as directed 09/20/21   Maribeth Camellia MATSU, MD  cholecalciferol (VITAMIN D3) 25 MCG (1000 UNIT) tablet Take 2,000 Units by mouth daily.    [provider]  doxycycline  (VIBRA -TABS) 100 MG tablet Take 1 tablet (100 mg total) by mouth 2 (two) times daily. Patient  not taking: Reported on 06/04/2023 05/06/23   Kenard Zachary PARAS, MD  fluticasone  (FLONASE ) 50 MCG/ACT nasal spray Place 2 sprays into both nostrils daily. 10/05/20   Maribeth Camellia MATSU, MD  omeprazole  (PRILOSEC) 40 MG capsule Take 1 capsule (40 mg total) by mouth daily. 10/02/22   Maribeth Camellia MATSU, MD  oxyCODONE -acetaminophen  (PERCOCET/ROXICET) 5-325 MG tablet Take 0.5-1 tablets by mouth every 6 (six) hours as needed for severe pain (pain score 7-10). 03/14/23   Patsey Lot, MD  Vitamin D , Ergocalciferol , (DRISDOL ) 1.25 MG (50000 UNIT) CAPS capsule Take 1 capsule (50,000 Units total) by mouth every 7 (seven) days. 11/11/20   Maribeth Camellia MATSU, MD    Allergies as of 04/09/2023   (No Known Allergies)    Family History  Problem Relation Age of Onset   Stroke Mother    Hypertension Mother    Dementia Mother    Heart attack Mother    Atrial fibrillation Mother    Heart disease Father 110   Sudden death Father    Hypertension Father    Hyperlipidemia Father    Heart attack Father    Stroke Sister 66   Alcohol abuse Sister    Valvular heart disease Sister    Atrial fibrillation Sister    Aneurysm Sister    Arrhythmia Brother    Atrial fibrillation Brother    Diabetes Son 77   Hodgkin's lymphoma Son    Colon cancer Neg Hx    Breast cancer Neg Hx     Social History   Socioeconomic History   Marital status: Widowed    Spouse name: Not on file   Number of children: 3   Years of education: Not on file   Highest education level: Not on file  Occupational History   Not on file  Tobacco Use   Smoking status: Former    Current packs/day: 0.00    Average packs/day: 2.0 packs/day for 16.0 years (32.0 ttl pk-yrs)    Types: Cigarettes    Start date: 09/28/1965    Quit date: 09/28/1981    Years since quitting: 41.7   Smokeless tobacco: Never  Vaping Use   Vaping status: Never Used  Substance and Sexual Activity   Alcohol use: No   Drug use: No   Sexual activity: Not Currently     Birth control/protection: Post-menopausal  Other Topics Concern   Not on file  Social History Narrative   Widow   Social Drivers of Health   Financial Resource Strain: Low Risk  (01/07/2023)   Overall Financial Resource Strain (CARDIA)    Difficulty of Paying Living Expenses: Not hard at all  Food Insecurity: No Food Insecurity (05/05/2023)   Hunger Vital Sign    Worried About Running Out of Food in the Last Year: Never true    Ran Out of Food in the Last Year: Never true  Transportation Needs: No Transportation Needs (05/05/2023)   PRAPARE - Administrator, Civil Service (Medical): No    Lack of Transportation (Non-Medical):  No  Physical Activity: Inactive (01/07/2023)   Exercise Vital Sign    Days of Exercise per Week: 0 days    Minutes of Exercise per Session: 0 min  Stress: No Stress Concern Present (01/07/2023)   [employer] of Occupational Health - Occupational Stress Questionnaire    Feeling of Stress : Not at all  Social Connections: Moderately Integrated (01/07/2023)   Social Connection and Isolation Panel [NHANES]    Frequency of Communication with Friends and Family: More than three times a week    Frequency of Social Gatherings with Friends and Family: More than three times a week    Attends Religious Services: More than 4 times per year    Active Member of [employer] or Organizations: Yes    Attends Banker Meetings: Never    Marital Status: Widowed  Intimate Partner Violence: Not At Risk (05/05/2023)   Humiliation, Afraid, Rape, and Kick questionnaire    Fear of Current or Ex-Partner: No    Emotionally Abused: No    Physically Abused: No    Sexually Abused: No    Review of Systems: See HPI, otherwise negative ROS  Physical Exam: BP (!) 145/64   Pulse 64   Temp (!) 97.2 F (36.2 C) (Temporal)   Resp 18   Ht 5' 2.5 (1.588 m)   Wt 75.3 kg   SpO2 98%   BMI 29.88 kg/m  General:   Alert,  pleasant and cooperative in NAD Head:   Normocephalic and atraumatic. Neck:  Supple; no masses or thyromegaly. Lungs:  Clear throughout to auscultation.    Heart:  Regular rate and rhythm. Abdomen:  Soft, nontender and nondistended. Normal bowel sounds, without guarding, and without rebound.   Neurologic:  Alert and  oriented x4;  grossly normal neurologically.  Impression/Plan: Kleigh Hoelzer is here for an colonoscopy to be performed for h/o colon adenoma  Risks, benefits, limitations, and alternatives regarding  colonoscopy have been reviewed with the patient.  Questions have been answered.  All parties agreeable.   Corinn [name], MD  06/04/2023, 8:06 AM

## 2023-06-04 NOTE — Anesthesia Postprocedure Evaluation (Signed)
 Anesthesia Post Note  Patient: Madison Craig  Procedure(s) Performed: COLONOSCOPY WITH PROPOFOL   Patient location during evaluation: Endoscopy Anesthesia Type: General Level of consciousness: awake and alert Pain management: pain level controlled Vital Signs Assessment: post-procedure vital signs reviewed and stable Respiratory status: spontaneous breathing, nonlabored ventilation, respiratory function stable and patient connected to nasal cannula oxygen Cardiovascular status: blood pressure returned to baseline and stable Postop Assessment: no apparent nausea or vomiting Anesthetic complications: no   No notable events documented.   Last Vitals:  Vitals:   06/04/23 0752 06/04/23 0907  BP: (!) 145/64 116/64  Pulse: 64   Resp: 18   Temp: (!) 36.2 C (!) 36.1 C  SpO2: 98%     Last Pain:  Vitals:   06/04/23 0927  TempSrc:   PainSc: 0-No pain                 Fairy POUR Filmore Molyneux

## 2023-06-04 NOTE — Anesthesia Preprocedure Evaluation (Signed)
 Anesthesia Evaluation  Patient identified by MRN, date of birth, ID band Patient awake    Reviewed: Allergy & Precautions, NPO status , Patient's Chart, lab work & pertinent test results  History of Anesthesia Complications Negative for: history of anesthetic complications  Airway Mallampati: III  TM Distance: <3 FB Neck ROM: full    Dental  (+) Chipped, Poor Dentition, Missing, Partial Upper   Pulmonary shortness of breath and with exertion, sleep apnea , COPD, former smoker   Pulmonary exam normal        Cardiovascular hypertension, Normal cardiovascular exam+ Valvular Problems/Murmurs      Neuro/Psych  Neuromuscular disease  negative psych ROS   GI/Hepatic Neg liver ROS, hiatal hernia,GERD  Controlled,,  Endo/Other  negative endocrine ROS    Renal/GU negative Renal ROS  negative genitourinary   Musculoskeletal   Abdominal   Peds  Hematology negative hematology ROS (+)   Anesthesia Other Findings Past Medical History: No date: Anemia No date: Arthritis No date: Basal cell carcinoma No date: Benign neoplasm of ascending colon 09/28/2013: Dyspnea     Comment:  Pulmonary function testing done on 02/09/2014 showed               minimal airway obstruction with a lack of response to               bronchodilators; her FEV1 was normal, the FEV1/FVC ratio               and the FEF 25-75% were reduced; the airway resistance               was normal; lung volumes were within normal limits; the               diffusing capacity was high for the measured volumes.   No date: GERD (gastroesophageal reflux disease) No date: Heart murmur No date: Heme + stool 01/11/2014: Macular degeneration     Comment:  Followed at [facility] in [place], [place].  No date: Multiple thyroid  nodules     Comment:  Thyroid  ultrasound on 01/25/2014 showed a multinodular               goiter, with a 1.5 cm nodule in the inferior left  thyroid               lobe that met the criteria for ultrasound-guided biopsy.               On 02/06/2014 an ultrasound guided needle aspirate biopsy               was performed of the left inferior thyroid  nodule;               cytopathology findings were consistent with               non-neoplastic goiter.    09/18/2014: Osteoporosis     Comment:  DXA 08/16/2014 showed osteopenia of right femur neck               (T-score -2.3) and osteoporosis of left forearm radius               (T-score -2.9).  Lumbar spine was not utilized due to               advanced degenerative changes.  No date: Reflux esophagitis 03/22/2009: SLEEP APNEA     Comment:  Had sleep study in [place] about 10 years, has BiPAP,  cannot sleep with it on.    Past Surgical History: 1988: BREAST SURGERY     Comment:  breast biopsy 2006: CARDIAC CATHETERIZATION     Comment:  No Stents/[facility] 04/24/2017: COLONOSCOPY WITH PROPOFOL ; N/A     Comment:  Procedure: COLONOSCOPY WITH PROPOFOL ;  Surgeon:               Janalyn Keene NOVAK, MD;  Location: [facility] ENDOSCOPY;                Service: Endoscopy;  Laterality: N/A; 04/17/2021: ENDOVENOUS ABLATION SAPHENOUS VEIN W/ LASER; Right     Comment:  endovenous laser ablation right greater saphenous vein               and stab phlebectomy > 20 incisions right leg by Medford Blade MD 04/24/2017: ESOPHAGOGASTRODUODENOSCOPY (EGD) WITH PROPOFOL ; N/A     Comment:  Procedure: ESOPHAGOGASTRODUODENOSCOPY (EGD) WITH               PROPOFOL ;  Surgeon: Janalyn Keene NOVAK, MD;  Location:               [facility] ENDOSCOPY;  Service: Endoscopy;  Laterality: N/A; No date: eye     Comment:  cateract surgery 05/05/2023: INCISION AND DRAINAGE OF WOUND; Left     Comment:  Procedure: IRRIGATION AND DEBRIDEMENT WOUND LEFT LONG               FINGER;  Surgeon: Edie Norleen PARAS, MD;  Location: [facility] ORS;              Service: Orthopedics;  Laterality: Left;  BMI    Body Mass  Index: 29.88 kg/m      Reproductive/Obstetrics negative OB ROS                             Anesthesia Physical Anesthesia Plan  ASA: 3  Anesthesia Plan: General   Post-op Pain Management:    Induction: Intravenous  PONV Risk Score and Plan: Propofol  infusion and TIVA  Airway Management Planned: Natural Airway and Nasal Cannula  Additional Equipment:   Intra-op Plan:   Post-operative Plan:   Informed Consent: I have reviewed the patients History and Physical, chart, labs and discussed the procedure including the risks, benefits and alternatives for the proposed anesthesia with the patient or authorized representative who has indicated his/her understanding and acceptance.     Dental Advisory Given  Plan Discussed with: Anesthesiologist, CRNA and Surgeon  Anesthesia Plan Comments: (Patient consented for risks of anesthesia including but not limited to:  - adverse reactions to medications - risk of airway placement if required - damage to eyes, teeth, lips or other oral mucosa - nerve damage due to positioning  - sore throat or hoarseness - Damage to heart, brain, nerves, lungs, other parts of body or loss of life  Patient voiced understanding and assent.)       Anesthesia Quick Evaluation

## 2023-06-04 NOTE — Transfer of Care (Signed)
 Immediate Anesthesia Transfer of Care Note  Patient: Madison Craig  Procedure(s) Performed: COLONOSCOPY WITH PROPOFOL   Patient Location: PACU  Anesthesia Type:General  Level of Consciousness: sedated  Airway & Oxygen Therapy: Patient Spontanous Breathing  Post-op Assessment: Report given to RN and Post -op Vital signs reviewed and stable  Post vital signs: Reviewed and stable  Last Vitals:  Vitals Value Taken Time  BP 116/64 06/04/23 0907  Temp 36.1 C 06/04/23 0907  Pulse 64 06/04/23 0908  Resp    SpO2 97 % 06/04/23 0908  Vitals shown include unfiled device data.  Last Pain:  Vitals:   06/04/23 0907  TempSrc: Temporal  PainSc: Asleep         Complications: No notable events documented.

## 2023-06-04 NOTE — Op Note (Signed)
 [facility] Patient Name: Madison Craig Procedure Date: 06/04/2023 8:38 AM MRN: [id number] Account #: [id number] Date of Birth: 02/16/1946 Admit Type: Outpatient Age: 78 Room: [facility] ENDO ROOM 3 Gender: Female Note Status: Finalized Instrument Name: Arvis [id number] Procedure:             Colonoscopy Indications:           Surveillance: Personal history of adenomatous polyps                         on last colonoscopy > 5 years ago, Last colonoscopy:                         November 2018 Providers:             Corinn Jess [name] MD, MD Referring MD:          Camellia MATSU. Maribeth (Referring MD) Medicines:             General Anesthesia Complications:         No immediate complications. Estimated blood loss: None. Procedure:             Pre-Anesthesia Assessment:                        - Prior to the procedure, a History and Physical was                         performed, and patient medications and allergies were                         reviewed. The patient is competent. The risks and                         benefits of the procedure and the sedation options and                         risks were discussed with the patient. All questions                         were answered and informed consent was obtained.                         Patient identification and proposed procedure were                         verified by the physician, the nurse, the                         anesthesiologist, the anesthetist and the technician                         in the pre-procedure area in the procedure room in the                         endoscopy suite. Mental Status Examination: alert and                         oriented. Airway Examination: normal oropharyngeal  airway and neck mobility. Respiratory Examination:                         clear to auscultation. CV Examination: normal.                         Prophylactic Antibiotics: The  patient does not require                         prophylactic antibiotics. Prior Anticoagulants: The                         patient has taken no anticoagulant or antiplatelet                         agents. ASA Grade Assessment: III - A patient with                         severe systemic disease. After reviewing the risks and                         benefits, the patient was deemed in satisfactory                         condition to undergo the procedure. The anesthesia                         plan was to use general anesthesia. Immediately prior                         to administration of medications, the patient was                         re-assessed for adequacy to receive sedatives. The                         heart rate, respiratory rate, oxygen saturations,                         blood pressure, adequacy of pulmonary ventilation, and                         response to care were monitored throughout the                         procedure. The physical status of the patient was                         re-assessed after the procedure.                        After obtaining informed consent, the colonoscope was                         passed under direct vision. Throughout the procedure,                         the patient's blood pressure, pulse, and oxygen  saturations were monitored continuously. The                         Colonoscope was introduced through the anus and                         advanced to the the cecum, identified by appendiceal                         orifice and ileocecal valve. The colonoscopy was                         performed without difficulty. The patient tolerated                         the procedure well. The quality of the bowel                         preparation was evaluated using the BBPS [facility] Bowel                         Preparation Scale) with scores of: Right Colon = 3,                         Transverse Colon = 3  and Left Colon = 3 (entire mucosa                         seen well with no residual staining, small fragments                         of stool or opaque liquid). The total BBPS score                         equals 9. The ileocecal valve, appendiceal orifice,                         and rectum were photographed. Findings:      The perianal and digital rectal examinations were normal. Pertinent       negatives include normal sphincter tone and no palpable rectal lesions.      Multiple diverticula were found in the recto-sigmoid colon and sigmoid       colon.      Non-bleeding external hemorrhoids were found during retroflexion. The       hemorrhoids were medium-sized. Impression:            - Diverticulosis in the recto-sigmoid colon and in the                         sigmoid colon.                        - Non-bleeding external hemorrhoids.                        - No specimens collected. Recommendation:        - Discharge patient to home (with escort).                        -  Resume previous diet today.                        - Continue present medications.                        - Repeat colonoscopy in 10 years for screening                         purposes. Procedure Code(s):     --- Professional ---                        H9894, Colorectal cancer screening; colonoscopy on                         individual at high risk Diagnosis Code(s):     --- Professional ---                        Z86.010, Personal history of colonic polyps                        K64.4, Residual hemorrhoidal skin tags                        K57.30, Diverticulosis of large intestine without                         perforation or abscess without bleeding CPT copyright 2022 American Medical Association. All rights reserved. The codes documented in this report are preliminary and upon coder review may  be revised to meet current compliance requirements. Dr. Angelita [name] Corinn Jess [name] MD, MD 06/04/2023  9:05:29 AM This report has been signed electronically. Number of Addenda: 0 Note Initiated On: 06/04/2023 8:38 AM Scope Withdrawal Time: 0 hours 7 minutes 22 seconds  Total Procedure Duration: 0 hours 15 minutes 37 seconds  Estimated Blood Loss:  Estimated blood loss: none.      [facility]

## 2023-06-05 ENCOUNTER — Encounter: Payer: Self-pay | Admitting: Gastroenterology

## 2023-06-30 DIAGNOSIS — H353221 Exudative age-related macular degeneration, left eye, with active choroidal neovascularization: Secondary | ICD-10-CM | POA: Diagnosis not present

## 2023-07-13 DIAGNOSIS — L089 Local infection of the skin and subcutaneous tissue, unspecified: Secondary | ICD-10-CM | POA: Diagnosis not present

## 2023-07-13 DIAGNOSIS — L600 Ingrowing nail: Secondary | ICD-10-CM | POA: Diagnosis not present

## 2023-07-13 DIAGNOSIS — L03022 Acute lymphangitis of left finger: Secondary | ICD-10-CM | POA: Diagnosis not present

## 2023-07-21 ENCOUNTER — Other Ambulatory Visit: Payer: Self-pay | Admitting: Family Medicine

## 2023-07-21 DIAGNOSIS — M1990 Unspecified osteoarthritis, unspecified site: Secondary | ICD-10-CM

## 2023-08-24 DIAGNOSIS — H353221 Exudative age-related macular degeneration, left eye, with active choroidal neovascularization: Secondary | ICD-10-CM | POA: Diagnosis not present

## 2023-09-06 ENCOUNTER — Encounter: Payer: Self-pay | Admitting: Oncology

## 2023-09-16 ENCOUNTER — Other Ambulatory Visit: Payer: Self-pay

## 2023-09-16 DIAGNOSIS — M1990 Unspecified osteoarthritis, unspecified site: Secondary | ICD-10-CM

## 2023-09-16 MED ORDER — DICLOFENAC SODIUM 75 MG PO TBEC: 75.0000 mg | DELAYED_RELEASE_TABLET | Freq: Two times a day (BID) | ORAL | 0 refills | Status: DC | PRN

## 2023-10-06 ENCOUNTER — Encounter: Payer: Self-pay | Admitting: Oncology

## 2023-10-06 ENCOUNTER — Ambulatory Visit: Payer: Self-pay | Admitting: Family Medicine

## 2023-10-06 ENCOUNTER — Encounter: Payer: Self-pay | Admitting: Family Medicine

## 2023-10-06 ENCOUNTER — Ambulatory Visit (INDEPENDENT_AMBULATORY_CARE_PROVIDER_SITE_OTHER): Payer: Medicare PPO | Admitting: Family Medicine

## 2023-10-06 VITALS — BP 162/92 | HR 58 | Temp 97.6°F | Resp 20 | Ht 62.5 in | Wt 174.1 lb

## 2023-10-06 DIAGNOSIS — Z8669 Personal history of other diseases of the nervous system and sense organs: Secondary | ICD-10-CM | POA: Diagnosis not present

## 2023-10-06 DIAGNOSIS — R7309 Other abnormal glucose: Secondary | ICD-10-CM | POA: Diagnosis not present

## 2023-10-06 DIAGNOSIS — I10 Essential (primary) hypertension: Secondary | ICD-10-CM | POA: Diagnosis not present

## 2023-10-06 DIAGNOSIS — M1990 Unspecified osteoarthritis, unspecified site: Secondary | ICD-10-CM | POA: Diagnosis not present

## 2023-10-06 DIAGNOSIS — D509 Iron deficiency anemia, unspecified: Secondary | ICD-10-CM

## 2023-10-06 DIAGNOSIS — J309 Allergic rhinitis, unspecified: Secondary | ICD-10-CM

## 2023-10-06 DIAGNOSIS — K219 Gastro-esophageal reflux disease without esophagitis: Secondary | ICD-10-CM

## 2023-10-06 DIAGNOSIS — Z1231 Encounter for screening mammogram for malignant neoplasm of breast: Secondary | ICD-10-CM

## 2023-10-06 DIAGNOSIS — E559 Vitamin D deficiency, unspecified: Secondary | ICD-10-CM

## 2023-10-06 DIAGNOSIS — D500 Iron deficiency anemia secondary to blood loss (chronic): Secondary | ICD-10-CM

## 2023-10-06 DIAGNOSIS — E7800 Pure hypercholesterolemia, unspecified: Secondary | ICD-10-CM | POA: Diagnosis not present

## 2023-10-06 DIAGNOSIS — E538 Deficiency of other specified B group vitamins: Secondary | ICD-10-CM

## 2023-10-06 DIAGNOSIS — I350 Nonrheumatic aortic (valve) stenosis: Secondary | ICD-10-CM

## 2023-10-06 LAB — CBC WITH DIFFERENTIAL/PLATELET
Basophils Absolute: 0 10*3/uL (ref 0.0–0.1)
Basophils Relative: 0.6 % (ref 0.0–3.0)
Eosinophils Absolute: 0.2 10*3/uL (ref 0.0–0.7)
Eosinophils Relative: 3.2 % (ref 0.0–5.0)
HCT: 36.4 % (ref 36.0–46.0)
Hemoglobin: 11.9 g/dL — ABNORMAL LOW (ref 12.0–15.0)
Lymphocytes Relative: 29.4 % (ref 12.0–46.0)
Lymphs Abs: 1.5 10*3/uL (ref 0.7–4.0)
MCHC: 32.8 g/dL (ref 30.0–36.0)
MCV: 90 fl (ref 78.0–100.0)
Monocytes Absolute: 0.3 10*3/uL (ref 0.1–1.0)
Monocytes Relative: 6 % (ref 3.0–12.0)
Neutro Abs: 3 10*3/uL (ref 1.4–7.7)
Neutrophils Relative %: 60.8 % (ref 43.0–77.0)
Platelets: 204 10*3/uL (ref 150.0–400.0)
RBC: 4.05 Mil/uL (ref 3.87–5.11)
RDW: 14.4 % (ref 11.5–15.5)
WBC: 4.9 10*3/uL (ref 4.0–10.5)

## 2023-10-06 LAB — COMPREHENSIVE METABOLIC PANEL WITH GFR
ALT: 18 U/L (ref 0–35)
AST: 21 U/L (ref 0–37)
Albumin: 4.2 g/dL (ref 3.5–5.2)
Alkaline Phosphatase: 76 U/L (ref 39–117)
BUN: 22 mg/dL (ref 6–23)
CO2: 29 meq/L (ref 19–32)
Calcium: 9.6 mg/dL (ref 8.4–10.5)
Chloride: 101 meq/L (ref 96–112)
Creatinine, Ser: 0.99 mg/dL (ref 0.40–1.20)
GFR: 55.51 mL/min — ABNORMAL LOW (ref >60.00)
Glucose, Bld: 94 mg/dL (ref 70–99)
Potassium: 4.2 meq/L (ref 3.5–5.1)
Sodium: 137 meq/L (ref 135–145)
Total Bilirubin: 0.5 mg/dL (ref 0.2–1.2)
Total Protein: 7.5 g/dL (ref 6.0–8.3)

## 2023-10-06 LAB — LIPID PANEL
Cholesterol: 187 mg/dL (ref 0–200)
HDL: 59.5 mg/dL (ref >39.00)
LDL Cholesterol: 113 mg/dL — ABNORMAL HIGH (ref 0–99)
NonHDL: 127.52
Total CHOL/HDL Ratio: 3
Triglycerides: 74 mg/dL (ref 0.0–149.0)
VLDL: 14.8 mg/dL (ref 0.0–40.0)

## 2023-10-06 LAB — TSH: TSH: 1.19 u[IU]/mL (ref 0.35–5.50)

## 2023-10-06 LAB — VITAMIN D 25 HYDROXY (VIT D DEFICIENCY, FRACTURES): VITD: 42.6 ng/mL (ref 30.00–100.00)

## 2023-10-06 LAB — VITAMIN B12: Vitamin B-12: 398 pg/mL (ref 211–911)

## 2023-10-06 LAB — HEMOGLOBIN A1C: Hgb A1c MFr Bld: 5.8 % (ref 4.6–6.5)

## 2023-10-06 MED ORDER — OMEPRAZOLE 40 MG PO CPDR
40.0000 mg | DELAYED_RELEASE_CAPSULE | Freq: Every day | ORAL | 3 refills | Status: DC
Start: 2023-10-06 — End: 2024-04-15

## 2023-10-06 MED ORDER — NABUMETONE 500 MG PO TABS: 500.0000 mg | ORAL_TABLET | Freq: Two times a day (BID) | ORAL | 1 refills | Status: DC | PRN

## 2023-10-06 MED ORDER — AMLODIPINE BESYLATE 2.5 MG PO TABS: 2.5000 mg | ORAL_TABLET | Freq: Every evening | ORAL | 0 refills | Status: DC

## 2023-10-06 MED ORDER — AZELASTINE HCL 0.1 % NA SOLN: 2.0000 | Freq: Two times a day (BID) | NASAL | 12 refills | Status: DC

## 2023-10-06 NOTE — Progress Notes (Signed)
 SUBJECTIVE:   Chief Complaint  Patient presents with   Medical Management of Chronic Issues    6 month follow up   HPI Presents for follow up chronic disease management  Discussed the use of AI scribe software for clinical note transcription with the patient, who gave verbal consent to proceed.  History of Present Illness Madison Craig is a 78 year old female with osteoarthritis who presents for a six-month follow-up.  She has a long-standing history of osteoarthritis affecting her fingers, knees, and ankles. She underwent surgery on her finger due to cellulitis in December, which required urgent care and hospitalization. Despite the chronic nature of her condition, she continues to manage her symptoms with Tylenol  and a topical cream, although she is not on any specific medication for her leg pain.  She experiences persistent pain and burning in her legs, especially at night. She has been taking diclofenac  for approximately 30 years, once daily, due to concerns about kidney function. She experiences shortness of breath with exertion and has an upcoming cardiology appointment in November. No shortness of breath at rest is reported.  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 She lives alone and has a history of working in factories for 23 years.     PERTINENT PMH / PSH: As above  OBJECTIVE:  BP (!) 162/92   Pulse (!) 58   Temp 97.6 F (36.4 C)   Resp 20   Ht 5' 2.5" (1.588 m)   Wt 174 lb 2 oz (79 kg)   SpO2 97%   BMI 31.34 kg/m    Physical Exam Vitals reviewed.  Constitutional:      General: She is not in acute distress.    Appearance: She is not ill-appearing.  HENT:     Head: Normocephalic.     Right Ear: Tympanic membrane, ear canal and external ear normal.     Left Ear: Tympanic membrane, ear canal and external ear normal.     Nose: Nose normal.     Mouth/Throat:     Mouth: Mucous membranes  are moist.  Eyes:     Extraocular Movements: Extraocular movements intact.     Conjunctiva/sclera: Conjunctivae normal.     Pupils: Pupils are equal, round, and reactive to light.  Neck:     Thyroid : No thyromegaly or thyroid  tenderness.     Vascular: No carotid bruit.  Cardiovascular:     Rate and Rhythm: Normal rate and regular rhythm.     Pulses: Normal pulses.     Heart sounds: Murmur heard.     Systolic murmur is present with a grade of 2/6.  Pulmonary:     Effort: Pulmonary effort is normal.     Breath sounds: Normal breath sounds.  Abdominal:     General: Bowel sounds are normal. There is no distension.     Palpations: Abdomen is soft.     Tenderness: There is no abdominal tenderness. There is no right CVA tenderness, left CVA tenderness, guarding or rebound.  Musculoskeletal:        General: Normal range of motion.     Cervical back: Normal range of motion.     Right lower leg: No edema.     Left lower leg: No edema.  Lymphadenopathy:     Cervical: No cervical adenopathy.  Skin:    Capillary Refill: Capillary refill takes less than 2 seconds.  Neurological:     General:  No focal deficit present.     Mental Status: She is alert and oriented to person, place, and time. Mental status is at baseline.     Motor: No weakness.  Psychiatric:        Mood and Affect: Mood normal.        Behavior: Behavior normal.        Thought Content: Thought content normal.        Judgment: Judgment normal.           10/06/2023   10:20 AM 04/06/2023    9:07 AM 01/07/2023   12:45 PM 10/02/2022   10:08 AM 09/03/2022    8:10 AM  Depression screen PHQ 2/9  Decreased Interest 0 0 0 0 0  Down, Depressed, Hopeless 0 0 0 0 0  PHQ - 2 Score 0 0 0 0 0  Altered sleeping 0 0 0 0 0  Tired, decreased energy 0 0 3 0 0  Change in appetite 0 0 0 0 0  Feeling bad or failure about yourself  0 0 0 0 0  Trouble concentrating 0 0 0 0 0  Moving slowly or fidgety/restless 0 0 0 0 0  Suicidal thoughts  0 0 0 0 0  PHQ-9 Score 0 0 3 0 0  Difficult doing work/chores Not difficult at all Not difficult at all Not difficult at all Not difficult at all Not difficult at all      10/06/2023   10:21 AM 04/06/2023    9:07 AM 10/02/2022   10:09 AM 09/03/2022    8:10 AM  GAD 7 : Generalized Anxiety Score  Nervous, Anxious, on Edge 0 0 0 0  Control/stop worrying 0 0 0 0  Worry too much - different things 0 0 0 0  Trouble relaxing 0 0 0 0  Restless 0 0 0 0  Easily annoyed or irritable 0 0 0 0  Afraid - awful might happen 0 0 0 0  Total GAD 7 Score 0 0 0 0  Anxiety Difficulty Not difficult at all Not difficult at all Not difficult at all Not difficult at all    ASSESSMENT/PLAN:  Essential hypertension Assessment & Plan: Elevated blood pressure.  Not currently on medication.  Start Amlodipine  2.5 mg at night Check Cmet Likely will need to increase medication at next visit Monitor BP at home. Goal <150/90  Orders: -     Comprehensive metabolic panel with GFR -     TSH -     amLODIPine  Besylate; Take 1 tablet (2.5 mg total) by mouth at bedtime.  Dispense: 90 tablet; Refill: 0  Allergic rhinitis, unspecified seasonality, unspecified trigger -     Azelastine  HCl; Place 2 sprays into both nostrils 2 (two) times daily. Use in each nostril as directed  Dispense: 30 mL; Refill: 12  Gastroesophageal reflux disease, unspecified whether esophagitis present Assessment & Plan: Refill Omeprazole   Orders: -     Omeprazole ; Take 1 capsule (40 mg total) by mouth daily.  Dispense: 90 capsule; Refill: 3  Osteoarthritis, unspecified osteoarthritis type, unspecified site Assessment & Plan: Chronic osteoarthritis with worsening symptoms in hands, knees, and ankles. Previous workup confirmed osteoarthritis. Likely contributing to pain. - Initiate trial of Ralafen 500 mg twice daily for a few weeks to evaluate efficacy and side effects. - Encourage rheumatology follow-up for further osteoarthritis  management.   Orders: -     Nabumetone ; Take 1 tablet (500 mg total) by mouth 2 (two) times daily as needed.  Dispense: 60 tablet; Refill: 1  Pure hypercholesterolemia -     Lipid panel  Vitamin D  deficiency -     VITAMIN D  25 Hydroxy (Vit-D Deficiency, Fractures)  Iron  deficiency anemia due to chronic blood loss -     CBC with Differential/Platelet  Breast cancer screening by mammogram -     3D Screening Mammogram, Left and Right  Vitamin B 12 deficiency -     Vitamin B12  Abnormal glucose -     Hemoglobin A1c  Aortic stenosis, mild Assessment & Plan: Heart murmur with occasional exertional dyspnea. Blood pressure elevated at 170/82. Cardiologist follow-up scheduled for November - Repeat ECHO  - Initiate antihypertensives today - Ensure cardiology follow-up in November.   History of neuropathy Assessment & Plan: Patient reports chronic neuropathy symptoms in legs, possibly related to prolonged standing or deficiencies. - Order blood work to assess vitamin B12, iron  levels, and electrolytes.       PDMP reviewed  Return in about 4 weeks (around 11/03/2023), or if symptoms worsen or fail to improve, for PCP.  Valli Gaw, MD

## 2023-10-06 NOTE — Patient Instructions (Addendum)
 It was a pleasure meeting you today. Thank you for allowing me to take part in your health care.  Our goals for today as we discussed include:  Refills sent for requested medications  Stop Diclofenac  Start Relafen 500 mg two times a day as needed  Start Amlodipine 2.5 mg at night  ECHO ordered to check murmur Call [phone] to schedule Echocardiogram at [facility]      This is a list of the screening recommended for you and due dates:  Health Maintenance  Topic Date Due   COVID-19 Vaccine (4 - 2024-25 season) 01/25/2023   Mammogram  09/29/2023   Flu Shot  12/25/2023   Medicare Annual Wellness Visit  01/07/2024   DTaP/Tdap/Td vaccine (4 - Td or Tdap) 01/12/2024   Colon Cancer Screening  06/03/2026   Pneumonia Vaccine  Completed   DEXA scan (bone density measurement)  Completed   Hepatitis C Screening  Completed   HPV Vaccine  Aged Out   Meningitis B Vaccine  Aged Out   Zoster (Shingles) Vaccine  Discontinued     If you have any questions or concerns, please do not hesitate to call the office at [phone].  I look forward to our next visit and until then take care and stay safe.  Regards,   Valli Gaw, MD   [facility]

## 2023-10-07 ENCOUNTER — Ambulatory Visit (INDEPENDENT_AMBULATORY_CARE_PROVIDER_SITE_OTHER)

## 2023-10-07 DIAGNOSIS — D509 Iron deficiency anemia, unspecified: Secondary | ICD-10-CM

## 2023-10-07 LAB — IBC + FERRITIN
Ferritin: 24 ng/mL (ref 10.0–291.0)
Iron: 66 ug/dL (ref 42–145)
Saturation Ratios: 18.3 % — ABNORMAL LOW (ref 20.0–50.0)
TIBC: 361.2 ug/dL (ref 250.0–450.0)
Transferrin: 258 mg/dL (ref 212.0–360.0)

## 2023-10-08 ENCOUNTER — Telehealth: Payer: Self-pay

## 2023-10-08 NOTE — Telephone Encounter (Signed)
 Copied from CRM [id number]. Topic: General - Other >> Oct 08, 2023 12:33 PM Albertha Alosa wrote: Reason for CRM: Felipa Horsfall from [facility] called in regarding order fro patient for echo, stated need to put in a new order stating that referral visit are used up and she is unable to schedule patient with that order

## 2023-10-09 ENCOUNTER — Other Ambulatory Visit: Payer: Self-pay

## 2023-10-09 DIAGNOSIS — I350 Nonrheumatic aortic (valve) stenosis: Secondary | ICD-10-CM

## 2023-10-09 NOTE — Telephone Encounter (Signed)
 Reached out to [place] for clarification

## 2023-10-09 NOTE — Telephone Encounter (Signed)
 ECHO reordered. Spoke to [place]. Pt will be scheduled.

## 2023-10-11 ENCOUNTER — Encounter: Payer: Self-pay | Admitting: Family Medicine

## 2023-10-11 DIAGNOSIS — Z8669 Personal history of other diseases of the nervous system and sense organs: Secondary | ICD-10-CM | POA: Insufficient documentation

## 2023-10-11 NOTE — Assessment & Plan Note (Signed)
 Heart murmur with occasional exertional dyspnea. Blood pressure elevated at 170/82. Cardiologist follow-up scheduled for November - Repeat ECHO  - Initiate antihypertensives today - Ensure cardiology follow-up in November.

## 2023-10-11 NOTE — Assessment & Plan Note (Signed)
 Chronic osteoarthritis with worsening symptoms in hands, knees, and ankles. Previous workup confirmed osteoarthritis. Likely contributing to pain. - Initiate trial of Ralafen 500 mg twice daily for a few weeks to evaluate efficacy and side effects. - Encourage rheumatology follow-up for further osteoarthritis management.

## 2023-10-11 NOTE — Assessment & Plan Note (Signed)
 Patient reports chronic neuropathy symptoms in legs, possibly related to prolonged standing or deficiencies. - Order blood work to assess vitamin B12, iron  levels, and electrolytes.

## 2023-10-11 NOTE — Assessment & Plan Note (Signed)
-   Refill Omeprazole

## 2023-10-11 NOTE — Assessment & Plan Note (Signed)
 Elevated blood pressure.  Not currently on medication.  Start Amlodipine  2.5 mg at night Check Cmet Likely will need to increase medication at next visit Monitor BP at home. Goal <150/90

## 2023-10-12 ENCOUNTER — Ambulatory Visit: Payer: Self-pay

## 2023-10-12 NOTE — Telephone Encounter (Signed)
 Reason for Disposition . [1] Caller has NON-URGENT medicine question about med that PCP prescribed AND [2] triager unable to answer question  Answer Assessment - Initial Assessment Questions 1. NAME of MEDICINE: "What medicine(s) are you calling about?"     Relafen  500mg   2. QUESTION: "What is your question?" (e.g., double dose of medicine, side effect)     Wants to switch back to diclofenac   3. PRESCRIBER: "Who prescribed the medicine?" Reason: if prescribed by specialist, call should be referred to that group.     Valli Gaw  4. SYMPTOMS: "Do you have any symptoms?" If Yes, ask: "What symptoms are you having?"  "How bad are the symptoms (e.g., mild, moderate, severe)     Nausea, body aches,   5. PREGNANCY:  "Is there any chance that you are pregnant?" "When was your last menstrual period?"     No  Protocols used: Medication Question Call-A-AH

## 2023-10-12 NOTE — Telephone Encounter (Addendum)
  Chief Complaint: Medication question Symptoms: nausea and body aches Frequency: constant Pertinent Negatives: Patient denies chest pain Disposition: [] ED /[] Urgent Care (no appt availability in office) / [] Appointment(In office/virtual)/ []  [place] [facility]/ [] Home Care/ [] Refused Recommended Disposition /[] [place] [facility]/ []  Follow-up with PCP Additional Notes: Patient seen on 5/13 by dr. Sueanne Emerald, taken off of Diclofenac  and started on trial of Relafen . Pt says that the Relafen  is not helping at all. Patient endorsing body aches, constant nausea, and "just feeling sick".  Pt says her pain was better controlled with the Diclofenac  75mg . 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  Pt reports that she was concerned for her kidney function while on the diclofenac  so she was take Diclofenac  75 only 1x a day.  She says she is willing to try it at a lower dose if that is an option. Pt requesting to be restarted on the Diclofenac . Pharmacy confirmed.    Pt also wanted physican to know that she now has an ECHO scheduled with her cardiologist on November 25, 2023 and is waiting on her daughter to assist with finding a rheumatologist.    Copied from CRM [id number]. Topic: Clinical - Red Word Triage >> Oct 12, 2023  8:16 AM Emylou G wrote: [facility] that prompted transfer to Nurse Triage: Dr Sueanne Emerald saw her last week was on nabumetone  (RELAFEN ) 500 MG tablet -- symptons: nausea .. body and head all hurting.. can't work because of it..was on diclofenatac but took her off.. wants back on.. said it was hurting her kidneys? >> Oct 12, 2023 12:10 PM Kita Perish H wrote: Patient calling back regarding message she sent this morning to provider advised patient provider will call back has to give her until the end of day  Reason for Disposition  [1] Caller has NON-URGENT medicine question about med that PCP prescribed AND  [2] triager unable to answer question  Answer Assessment - Initial Assessment Questions 1. NAME of MEDICINE: "What medicine(s) are you calling about?"     Relafen  500mg   2. QUESTION: "What is your question?" (e.g., double dose of medicine, side effect)     Wants to switch back to diclofenac   3. PRESCRIBER: "Who prescribed the medicine?" Reason: if prescribed by specialist, call should be referred to that group.     Valli Gaw  4. SYMPTOMS: "Do you have any symptoms?" If Yes, ask: "What symptoms are you having?"  "How bad are the symptoms (e.g., mild, moderate, severe)     Nausea, body aches,   5. PREGNANCY:  "Is there any chance that you are pregnant?" "When was your last menstrual period?"     No  Protocols used: Medication Question Call-A-A

## 2023-10-12 NOTE — Telephone Encounter (Signed)
 Copied from CRM [id number]. Topic: General - Other >> Oct 08, 2023 12:33 PM Albertha Alosa wrote: Reason for CRM: Felipa Horsfall from [facility] called in regarding order fro patient for echo, stated need to put in a new order stating that referral visit are used up and she is unable to schedule patient with that order >> Oct 12, 2023  8:23 AM Emylou G wrote: Patient.. couldn't wait.[facility] call her.. number on file is good.

## 2023-10-14 ENCOUNTER — Other Ambulatory Visit: Payer: Self-pay | Admitting: Family Medicine

## 2023-10-14 NOTE — Telephone Encounter (Signed)
 Spoke to Patient regarding Dr. Sueanne Emerald stating to not restart the Diclofenac  due to elevated blood pressure. The Patient states she has already restarted it because she has to have it to work and she has to work.

## 2023-10-14 NOTE — Progress Notes (Signed)
 Patient not using Ralefan and has resumed Diclofenac  Blood pressure not controlled  Have discontinued Ralefan No further refills for Diclofenac  orally.  Can use Diclofenac  gel, Tylenol  arthritis and trial physical therapy.  Can refer to pain management if not controlled  Can also follow up with Orthopedics for advancing OA  Valli Gaw, MD

## 2023-11-03 DIAGNOSIS — H2511 Age-related nuclear cataract, right eye: Secondary | ICD-10-CM | POA: Diagnosis not present

## 2023-11-03 DIAGNOSIS — H353221 Exudative age-related macular degeneration, left eye, with active choroidal neovascularization: Secondary | ICD-10-CM | POA: Diagnosis not present

## 2023-11-03 DIAGNOSIS — Z961 Presence of intraocular lens: Secondary | ICD-10-CM | POA: Diagnosis not present

## 2023-11-04 ENCOUNTER — Ambulatory Visit (INDEPENDENT_AMBULATORY_CARE_PROVIDER_SITE_OTHER): Admitting: Family Medicine

## 2023-11-04 ENCOUNTER — Encounter: Payer: Self-pay | Admitting: Family Medicine

## 2023-11-04 VITALS — BP 130/68 | HR 68 | Temp 98.1°F | Resp 20 | Ht 62.5 in | Wt 173.5 lb

## 2023-11-04 DIAGNOSIS — M1990 Unspecified osteoarthritis, unspecified site: Secondary | ICD-10-CM

## 2023-11-04 DIAGNOSIS — I10 Essential (primary) hypertension: Secondary | ICD-10-CM | POA: Diagnosis not present

## 2023-11-04 MED ORDER — AMLODIPINE BESYLATE 2.5 MG PO TABS: 2.5000 mg | ORAL_TABLET | Freq: Every evening | ORAL | 3 refills | Status: DC

## 2023-11-04 MED ORDER — DICLOFENAC SODIUM 50 MG PO TBEC
50.0000 mg | DELAYED_RELEASE_TABLET | Freq: Every day | ORAL | 0 refills | Status: DC
Start: 2023-11-04 — End: 2023-11-12

## 2023-11-04 NOTE — Progress Notes (Signed)
 SUBJECTIVE:   Chief Complaint  Patient presents with   Medical Management of Chronic Issues    4 weeks follow up   HPI Presents for follow up chronic disease management   Discussed the use of AI scribe software for clinical note transcription with the patient, who gave verbal consent to proceed.  History of Present Illness  year old female with hypertension who presents for follow-up of her blood pressure management.  She was started on a new blood pressure medication, but experienced severe stomach upset with the medication that replaced diclofenac , which she had been taking for about 30 years. Her blood pressure tends to fluctuate, often decreasing when she is at home.  She has been taking diclofenac  75 mg once daily for pain management for approximately 30 years. Recently, she switched to taking two ibuprofen in the morning and two at night for the past 11 days, but this regimen has not been effective in managing her pain. The alternative medication, nabutafine, caused significant stomach upset, preventing her from working.  Her family history is significant for cardiovascular issues. Her father died of a massive heart attack before age 78, her mother and sister have had heart attacks, another sister has valve issues and an aneurysm, and her youngest brother has atrial fibrillation and underwent a procedure for it. She believes her blood pressure issues may be hereditary.  She is currently taking amlodipine  for blood pressure and omeprazole  to protect her stomach while taking NSAIDs. She is concerned about the side effects of blood pressure medications and their impact on her ability to work.  PERTINENT PMH / PSH: As above  OBJECTIVE:  BP 130/68   Pulse 68   Temp 98.1 F (36.7 C)   Resp 20   Ht 5' 2.5 (1.588 m)   Wt 173 lb 8 oz (78.7 kg)   SpO2 96%   BMI 31.23 kg/m    Physical Exam Vitals reviewed.  Constitutional:      General: She is not in acute distress.     Appearance: Normal appearance. She is normal weight. She is not ill-appearing, toxic-appearing or diaphoretic.  Eyes:     General:        Right eye: No discharge.        Left eye: No discharge.     Conjunctiva/sclera: Conjunctivae normal.  Cardiovascular:     Rate and Rhythm: Normal rate.  Pulmonary:     Effort: Pulmonary effort is normal.  Musculoskeletal:        General: Normal range of motion.  Skin:    General: Skin is warm and dry.  Neurological:     General: No focal deficit present.     Mental Status: She is alert and oriented to person, place, and time. Mental status is at baseline.  Psychiatric:        Mood and Affect: Mood normal.        Behavior: Behavior normal.        Thought Content: Thought content normal.        Judgment: Judgment normal.           10/06/2023   10:20 AM 04/06/2023    9:07 AM 01/07/2023   12:45 PM 10/02/2022   10:08 AM 09/03/2022    8:10 AM  Depression screen PHQ 2/9  Decreased Interest 0 0 0 0 0  Down, Depressed, Hopeless 0 0 0 0 0  PHQ - 2 Score 0 0 0 0 0  Altered sleeping 0 0 0  0 0  Tired, decreased energy 0 0 3 0 0  Change in appetite 0 0 0 0 0  Feeling bad or failure about yourself  0 0 0 0 0  Trouble concentrating 0 0 0 0 0  Moving slowly or fidgety/restless 0 0 0 0 0  Suicidal thoughts 0 0 0 0 0  PHQ-9 Score 0 0 3 0 0  Difficult doing work/chores Not difficult at all Not difficult at all Not difficult at all Not difficult at all Not difficult at all      10/06/2023   10:21 AM 04/06/2023    9:07 AM 10/02/2022   10:09 AM 09/03/2022    8:10 AM  GAD 7 : Generalized Anxiety Score  Nervous, Anxious, on Edge 0 0 0 0  Control/stop worrying 0 0 0 0  Worry too much - different things 0 0 0 0  Trouble relaxing 0 0 0 0  Restless 0 0 0 0  Easily annoyed or irritable 0 0 0 0  Afraid - awful might happen 0 0 0 0  Total GAD 7 Score 0 0 0 0  Anxiety Difficulty Not difficult at all Not difficult at all Not difficult at all Not difficult at  all    ASSESSMENT/PLAN:  Essential hypertension Assessment & Plan: Hypertension controlled with amlodipine . Family history of cardiovascular disease increases risk. Previous antihypertensive side effects noted. - Refill amlodipine  2.5 mg daily - Monitor blood pressure.   Orders: -     amLODIPine  Besylate; Take 1 tablet (2.5 mg total) by mouth at bedtime.  Dispense: 90 tablet; Refill: 3  Osteoarthritis, unspecified osteoarthritis type, unspecified site Assessment & Plan: Diclofenac  preferred for pain management despite NSAID risks. Previous trials of ibuprofen and nabutafine ineffective with GI side effects. - Restart diclofenac  50 mg once daily as needed. - Monitor for gastrointestinal side effects. - Continue omeprazole . - Monitor kidney function. - Reassess pain control and adjust diclofenac  dosage if necessary.  Orders: -     Diclofenac  Sodium; Take 1 tablet (50 mg total) by mouth daily.  Dispense: 30 tablet; Refill: 0     PDMP reviewed  Return in about 6 months (around 05/05/2024).  Valli Gaw, MD

## 2023-11-04 NOTE — Assessment & Plan Note (Signed)
 Diclofenac  preferred for pain management despite NSAID risks. Previous trials of ibuprofen and nabutafine ineffective with GI side effects. - Restart diclofenac  50 mg once daily as needed. - Monitor for gastrointestinal side effects. - Continue omeprazole . - Monitor kidney function. - Reassess pain control and adjust diclofenac  dosage if necessary.

## 2023-11-04 NOTE — Patient Instructions (Addendum)
 It was a pleasure meeting you today. Thank you for allowing me to take part in your health care.  Our goals for today as we discussed include:  Blood pressure is good today Continue Amlodipine  2.5 mg daily  Restart Diclofenac  EC 50 mg daily as needed.  Notify MD in 1 week if helping and will send in refill.  This is a list of the screening recommended for you and due dates:  Health Maintenance  Topic Date Due   COVID-19 Vaccine (4 - 2024-25 season) 01/25/2023   Mammogram  09/29/2023   Flu Shot  12/25/2023   Medicare Annual Wellness Visit  01/07/2024   DTaP/Tdap/Td vaccine (4 - Td or Tdap) 01/12/2024   Colon Cancer Screening  06/03/2026   Pneumonia Vaccine  Completed   DEXA scan (bone density measurement)  Completed   Hepatitis C Screening  Completed   HPV Vaccine  Aged Out   Meningitis B Vaccine  Aged Out   Zoster (Shingles) Vaccine  Discontinued      If you have any questions or concerns, please do not hesitate to call the office at [phone].  I look forward to our next visit and until then take care and stay safe.  Regards,   Valli Gaw, MD   [facility]

## 2023-11-04 NOTE — Assessment & Plan Note (Signed)
 Hypertension controlled with amlodipine . Family history of cardiovascular disease increases risk. Previous antihypertensive side effects noted. - Refill amlodipine  2.5 mg daily - Monitor blood pressure.

## 2023-11-04 NOTE — Assessment & Plan Note (Deleted)
 Diclofenac  preferred for pain management despite NSAID risks. Previous trials of ibuprofen and nabutafine ineffective with GI side effects. - Restart diclofenac  50 mg once daily as needed. - Monitor for gastrointestinal side effects. - Continue omeprazole . - Monitor kidney function. - Reassess pain control and adjust diclofenac  dosage if necessary.

## 2023-11-11 ENCOUNTER — Telehealth: Payer: Self-pay

## 2023-11-11 NOTE — Telephone Encounter (Signed)
 Copied from CRM [id number]. Topic: General - Other >> Nov 11, 2023  8:22 AM Clyde Darling P wrote: Reason for CRM: Pt called diclofenac  (VOLTAREN ) 50 MG EC tablet 50 mg is not working - major problems with Hips/knees - would like to increase to 75 MG Pt state she already has a prescription bottle for 75mg ,she would need to be able to refill it.

## 2023-11-12 ENCOUNTER — Other Ambulatory Visit: Payer: Self-pay | Admitting: Family Medicine

## 2023-11-12 DIAGNOSIS — M1990 Unspecified osteoarthritis, unspecified site: Secondary | ICD-10-CM

## 2023-11-12 MED ORDER — DICLOFENAC SODIUM 75 MG PO TBEC: 75.0000 mg | DELAYED_RELEASE_TABLET | Freq: Every day | ORAL | 0 refills | Status: DC

## 2023-11-12 NOTE — Telephone Encounter (Signed)
 Left message to return call to our office.  Please see message from provider below. Document when spoke to.

## 2023-11-17 ENCOUNTER — Ambulatory Visit
Admission: RE | Admit: 2023-11-17 | Discharge: 2023-11-17 | Disposition: A | Discharge status: Home / Self Care | Source: Ambulatory Visit | Attending: Family Medicine | Admitting: Family Medicine

## 2023-11-17 DIAGNOSIS — Z1231 Encounter for screening mammogram for malignant neoplasm of breast: Secondary | ICD-10-CM | POA: Insufficient documentation

## 2023-11-18 NOTE — Telephone Encounter (Signed)
 Left message to return call to our office.  Please see message from provider below. Document when spoke to.

## 2023-11-19 NOTE — Telephone Encounter (Signed)
 Pt is aware that medication has been called in. Pt has already picked up the prescription.

## 2023-11-25 ENCOUNTER — Ambulatory Visit
Admission: RE | Admit: 2023-11-25 | Discharge: 2023-11-25 | Disposition: A | Discharge status: Home / Self Care | Source: Ambulatory Visit | Attending: Family Medicine | Admitting: Family Medicine

## 2023-11-25 DIAGNOSIS — R011 Cardiac murmur, unspecified: Secondary | ICD-10-CM | POA: Diagnosis not present

## 2023-11-25 DIAGNOSIS — G4730 Sleep apnea, unspecified: Secondary | ICD-10-CM | POA: Diagnosis not present

## 2023-11-25 DIAGNOSIS — I350 Nonrheumatic aortic (valve) stenosis: Secondary | ICD-10-CM | POA: Diagnosis not present

## 2023-11-25 DIAGNOSIS — I517 Cardiomegaly: Secondary | ICD-10-CM | POA: Insufficient documentation

## 2023-11-25 LAB — ECHOCARDIOGRAM COMPLETE
AR max vel: 1.21 cm2
AV Area VTI: 1.19 cm2
AV Area mean vel: 1.23 cm2
AV Mean grad: 16.3 mmHg
AV Peak grad: 30 mmHg
Ao pk vel: 2.74 m/s
Area-P 1/2: 3.11 cm2
MV VTI: 2.37 cm2
S' Lateral: 3 cm

## 2023-11-25 NOTE — Progress Notes (Signed)
*  PRELIMINARY RESULTS* Echocardiogram 2D Echocardiogram has been performed.  Madison Craig 11/25/2023, 9:59 AM

## 2023-12-15 DIAGNOSIS — H353221 Exudative age-related macular degeneration, left eye, with active choroidal neovascularization: Secondary | ICD-10-CM | POA: Diagnosis not present

## 2024-01-11 ENCOUNTER — Ambulatory Visit: Payer: Self-pay

## 2024-01-11 NOTE — Telephone Encounter (Signed)
 Poor connection - call dropped - will place in call back.       Copied from CRM #[id number]. Topic: Clinical - Red Word Triage >> Jan 11, 2024  9:50 AM [place] A wrote: [facility] that prompted transfer to Nurse Triage: Patient is having hip pain for a few weeks and it is not getting better. Answer Assessment - Initial Assessment Questions 1. LOCATION and RADIATION: Where is the pain located? Does the pain spread (shoot) anywhere else?     Left hip - a couple weeks 2. QUALITY: What does the pain feel like?  (e.g., sharp, dull, aching, burning)     Hurts to put weight  3. SEVERITY: How bad is the pain? What does it keep you from doing?   (Scale 1-10; or mild, moderate, severe)     *No Answer* 4. ONSET: When did the pain start? Does it come and go, or is it there all the time?     Weeks ago 5. WORK OR EXERCISE: Has there been any recent work or exercise that involved this part of the body?      *No Answer* 6. CAUSE: What do you think is causing the hip pain?      *No Answer* 7. AGGRAVATING FACTORS: What makes the hip pain worse? (e.g., walking, climbing stairs, running)     *No Answer* 8. OTHER SYMPTOMS: Do you have any other symptoms? (e.g., back pain, pain shooting down leg,  fever, rash)     *No Answer*  Protocols used: Hip Pain-A-AH

## 2024-01-11 NOTE — Telephone Encounter (Signed)
 Voicemail left for patient to call back to Nurse Triage. Will route to office.

## 2024-01-11 NOTE — Telephone Encounter (Signed)
 Attempted to call patient-message left for patient to call back to Nurse Triage. Will place in call backs.

## 2024-01-12 ENCOUNTER — Ambulatory Visit (INDEPENDENT_AMBULATORY_CARE_PROVIDER_SITE_OTHER): Payer: Medicare PPO | Admitting: *Deleted

## 2024-01-12 VITALS — Ht 62.5 in | Wt 174.0 lb

## 2024-01-12 DIAGNOSIS — Z0000 Encounter for general adult medical examination without abnormal findings: Secondary | ICD-10-CM

## 2024-01-12 NOTE — Telephone Encounter (Signed)
 Patient has an appointment for bilateral hip pain on 01/13/24 with Gustavo Level at [facility].

## 2024-01-12 NOTE — Patient Instructions (Signed)
 Ms. Berenguer , Thank you for taking time out of your busy schedule to complete your Annual Wellness Visit with me. I enjoyed our conversation and look forward to speaking with you again next year. I, as well as your care team,  appreciate your ongoing commitment to your health goals. Please review the following plan we discussed and let me know if I can assist you in the future. Your Game plan/ To Do List    Referrals: If you haven't heard from the office you've been referred to, please reach out to them at the phone provided.  Remember to update your tetanus and flu vaccines as discussed.  Follow up Visits: We will see or speak with you next year for your Next Medicare AWV with our clinical staff 01/16/25 @ 1:00 Have you seen your provider in the last 6 months (3 months if uncontrolled diabetes)? Yes  Clinician Recommendations:  Aim for 30 minutes of exercise or brisk walking, 6-8 glasses of water, and 5 servings of fruits and vegetables each day.       This is a list of the screenings recommended for you:  Health Maintenance  Topic Date Due   COVID-19 Vaccine (4 - 2024-25 season) 01/25/2023   DTaP/Tdap/Td vaccine (4 - Td or Tdap) 01/12/2024   Flu Shot  12/25/2023   Mammogram  11/16/2024   Medicare Annual Wellness Visit  01/11/2025   Colon Cancer Screening  06/03/2026   Pneumococcal Vaccine for age over 39  Completed   DEXA scan (bone density measurement)  Completed   Hepatitis C Screening  Completed   HPV Vaccine  Aged Out   Meningitis B Vaccine  Aged Out   Pneumococcal Vaccine  Discontinued   Zoster (Shingles) Vaccine  Discontinued    Advanced directives: (Declined) Advance directive discussed with you today. Even though you declined this today, please call our office should you change your mind, and we can give you the proper paperwork for you to fill out. Advance Care Planning is important because it:  [x]  Makes sure you receive the medical care that is consistent with your  values, goals, and preferences  [x]  It provides guidance to your family and loved ones and reduces their decisional burden about whether or not they are making the right decisions based on your wishes.

## 2024-01-12 NOTE — Progress Notes (Signed)
 Subjective:   Madison Craig is a 78 y.o. who presents for a Medicare Wellness preventive visit.  As a reminder, Annual Wellness Visits don't include a physical exam, and some assessments may be limited, especially if this visit is performed virtually. We may recommend an in-person follow-up visit with your provider if needed.  Visit Complete: Virtual I connected with  Reena Mardeen Dunnings on 01/12/24 by a audio enabled telemedicine application and verified that I am speaking with the correct person using two identifiers.  Patient Location: Home  Provider Location: Home Office  I discussed the limitations of evaluation and management by telemedicine. The patient expressed understanding and agreed to proceed.  Vital Signs: Because this visit was a virtual/telehealth visit, some criteria may be missing or patient reported. Any vitals not documented were not able to be obtained and vitals that have been documented are patient reported.  VideoDeclined- This patient declined Librarian, academic. Therefore the visit was completed with audio only.  Persons Participating in Visit: Patient.  AWV Questionnaire: No: Patient Medicare AWV questionnaire was not completed prior to this visit.  Cardiac Risk Factors include: advanced age (>28men, >29 women);dyslipidemia;hypertension     Objective:    Today's Vitals   01/12/24 1303  Weight: 174 lb (78.9 kg)  Height: 5' 2.5 (1.588 m)  PainSc: 8    Body mass index is 31.32 kg/m.     01/12/2024    1:17 PM 06/04/2023    7:50 AM 05/05/2023    4:55 PM 05/05/2023   10:19 AM 05/05/2023    9:15 AM 03/14/2023    6:40 PM 01/07/2023   12:53 PM  Advanced Directives  Does Patient Have a Medical Advance Directive? No No No No No No No  Would patient like information on creating a medical advance directive? No - Patient declined  No - Patient declined No - Patient declined   No - Patient declined    Current  Medications (verified) Outpatient Encounter Medications as of 01/12/2024  Medication Sig   Acetaminophen  (TYLENOL  PO) Take by mouth as needed.   amLODipine  (NORVASC ) 2.5 MG tablet Take 1 tablet (2.5 mg total) by mouth at bedtime.   azelastine  (ASTELIN ) 0.1 % nasal spray Place 2 sprays into both nostrils 2 (two) times daily. Use in each nostril as directed   cetirizine (ZYRTEC) 10 MG chewable tablet Chew 10 mg by mouth daily.   cholecalciferol (VITAMIN D3) 25 MCG (1000 UNIT) tablet Take 2,000 Units by mouth daily.   diclofenac  (VOLTAREN ) 75 MG EC tablet Take 1 tablet (75 mg total) by mouth daily.   omeprazole  (PRILOSEC) 40 MG capsule Take 1 capsule (40 mg total) by mouth daily.   No facility-administered encounter medications on file as of 01/12/2024.    Allergies (verified) Patient has no known allergies.   History: Past Medical History:  Diagnosis Date   Anemia    Arthritis    Basal cell carcinoma    Benign neoplasm of ascending colon    Dyspnea 09/28/2013   Pulmonary function testing done on 02/09/2014 showed minimal airway obstruction with a lack of response to bronchodilators; her FEV1 was normal, the FEV1/FVC ratio and the FEF 25-75% were reduced; the airway resistance was normal; lung volumes were within normal limits; the diffusing capacity was high for the measured volumes.     GERD (gastroesophageal reflux disease)    Heart murmur    Heme + stool    Macular degeneration 01/11/2014   Followed at [facility] in [place], [place].    Multiple thyroid  nodules    Thyroid  ultrasound on 01/25/2014 showed a multinodular goiter, with a 1.5 cm nodule in the inferior left thyroid  lobe that met the criteria for ultrasound-guided biopsy.  On 02/06/2014 an ultrasound guided needle aspirate biopsy was performed of the left inferior thyroid  nodule; cytopathology findings were consistent with non-neoplastic goiter.      Osteoporosis 09/18/2014   DXA 08/16/2014 showed osteopenia of right femur  neck (T-score -2.3) and osteoporosis of left forearm radius (T-score -2.9).  Lumbar spine was not utilized due to advanced degenerative changes.    Reflux esophagitis    SLEEP APNEA 03/22/2009   Had sleep study in [place] about 10 years, has BiPAP, cannot sleep with it on.     Past Surgical History:  Procedure Laterality Date   BREAST SURGERY  1988   breast biopsy   CARDIAC CATHETERIZATION  2006   No Stents/[facility]   COLONOSCOPY WITH PROPOFOL  N/A 04/24/2017   Procedure: COLONOSCOPY WITH PROPOFOL ;  Surgeon: Janalyn Keene NOVAK, MD;  Location: [facility] ENDOSCOPY;  Service: Endoscopy;  Laterality: N/A;   COLONOSCOPY WITH PROPOFOL  N/A 06/04/2023   Procedure: COLONOSCOPY WITH PROPOFOL ;  Surgeon: Unk Corinn Skiff, MD;  Location[facility] ENDOSCOPY;  Service: Gastroenterology;  Laterality: N/A;   ENDOVENOUS ABLATION SAPHENOUS VEIN W/ LASER Right 04/17/2021   endovenous laser ablation right greater saphenous vein and stab phlebectomy > 20 incisions right leg by Medford Blade MD   ESOPHAGOGASTRODUODENOSCOPY (EGD) WITH PROPOFOL  N/A 04/24/2017   Procedure: ESOPHAGOGASTRODUODENOSCOPY (EGD) WITH PROPOFOL ;  Surgeon: Janalyn Keene NOVAK, MD;  Location: [facility] ENDOSCOPY;  Service: Endoscopy;  Laterality: N/A;   eye     cateract surgery   INCISION AND DRAINAGE OF WOUND Left 05/05/2023   Procedure: IRRIGATION AND DEBRIDEMENT WOUND LEFT LONG FINGER;  Surgeon: Edie Norleen PARAS, MD;  Location: [facility] ORS;  Service: Orthopedics;  Laterality: Left;   Family History  Problem Relation Age of Onset   Stroke Mother    Hypertension Mother    Dementia Mother    Heart attack Mother    Atrial fibrillation Mother    Heart disease Father 61   Sudden death Father    Hypertension Father    Hyperlipidemia Father    Heart attack Father    Stroke Sister 69   Alcohol abuse Sister    Valvular heart disease Sister    Atrial fibrillation Sister    Aneurysm Sister    Arrhythmia Brother    Atrial fibrillation Brother    Diabetes  Son 40   Hodgkin's lymphoma Son    Colon cancer Neg Hx    Breast cancer Neg Hx    Social History   Socioeconomic History   Marital status: Widowed    Spouse name: Not on file   Number of children: 3   Years of education: Not on file   Highest education level: Not on file  Occupational History   Not on file  Tobacco Use   Smoking status: Former    Current packs/day: 0.00    Average packs/day: 2.0 packs/day for 16.0 years (32.0 ttl pk-yrs)    Types: Cigarettes    Start date: 09/28/1965    Quit date: 09/28/1981    Years since quitting: 42.3   Smokeless tobacco: Never  Vaping Use   Vaping status: Never Used  Substance and Sexual Activity   Alcohol use: No   Drug use: No   Sexual activity: Not Currently    Birth control/protection: Post-menopausal  Other Topics Concern   Not on file  Social History Narrative   Widow   Social Drivers of Health   Financial Resource Strain: Low Risk  (01/12/2024)   Overall Financial Resource Strain (CARDIA)    Difficulty of Paying Living Expenses: Not hard at all  Food Insecurity: No Food Insecurity (01/12/2024)   Hunger Vital Sign    Worried About Running Out of Food in the Last Year: Never true    Ran Out of Food in the Last Year: Never true  Transportation Needs: No Transportation Needs (01/12/2024)   PRAPARE - Administrator, Civil Service (Medical): No    Lack of Transportation (Non-Medical): No  Physical Activity: Inactive (01/12/2024)   Exercise Vital Sign    Days of Exercise per Week: 0 days    Minutes of Exercise per Session: 0 min  Stress: No Stress Concern Present (01/12/2024)   [employer] of Occupational Health - Occupational Stress Questionnaire    Feeling of Stress: Only a little  Social Connections: Moderately Isolated (01/12/2024)   Social Connection and Isolation Panel    Frequency of Communication with Friends and Family: More than three times a week    Frequency of Social Gatherings with Friends and  Family: More than three times a week    Attends Religious Services: More than 4 times per year    Active Member of [employer] or Organizations: No    Attends Banker Meetings: Never    Marital Status: Widowed    Tobacco Counseling Counseling given: Not Answered    Clinical Intake:  Pre-visit preparation completed: Yes  Pain : 0-10 Pain Score: 8  Pain Type: Acute pain (has appointment scheduled with orth 01/13/24) Pain Location: Leg Pain Orientation: Left Pain Descriptors / Indicators: Dull Pain Onset: 1 to 4 weeks ago Pain Frequency: Intermittent     BMI - recorded: 31.32 Nutritional Status: BMI > 30  Obese Nutritional Risks: None Diabetes: No  Lab Results  Component Value Date   HGBA1C 5.8 10/06/2023   HGBA1C 6.0 04/06/2023   HGBA1C 5.7 09/03/2022     How often do you need to have someone help you when you read instructions, pamphlets, or other written materials from your doctor or pharmacy?: 1 - Never  Interpreter Needed?: No  Information Entered by  FABIENE Fredericks LPN   Activities of Daily Living     01/12/2024    1:06 PM 05/05/2023    8:37 PM  In your present state of health, do you have any difficulty performing the following activities:  Hearing? 1   Vision? 1   Difficulty concentrating or making decisions? 0   Walking or climbing stairs? 1   Dressing or bathing? 0   Doing errands, shopping? 0 0  Preparing Food and eating ? N   Using the Toilet? N   In the past six months, have you accidently leaked urine? N   Do you have problems with loss of bowel control? N   Managing your Medications? N   Managing your Finances? N   Housekeeping or managing your Housekeeping? N     Patient Care Team: Unk Corinn Skiff, MD as Consulting Physician (Gastroenterology) Janit Alm Agent, MD as Consulting Physician (Obstetrics and Gynecology) Kathlynn Sharper, MD as Consulting Physician (Orthopedic Surgery)  I have updated your Care Teams any recent Medical  Services you may have received from other providers in the past year.     Assessment:   This is a routine wellness  examination for [facility].  Hearing/Vision screen Hearing Screening - Comments:: Needs aids but too expensive Vision Screening - Comments:: Partial vision in right eye   Goals Addressed             This Visit's Progress    Patient Stated       Wants to get a knee replacement       Depression Screen     01/12/2024    1:13 PM 10/06/2023   10:20 AM 04/06/2023    9:07 AM 01/07/2023   12:45 PM 10/02/2022   10:08 AM 09/03/2022    8:10 AM 03/04/2022    8:09 AM  PHQ 2/9 Scores  PHQ - 2 Score 0 0 0 0 0 0 0  PHQ- 9 Score 2 0 0 3 0 0     Fall Risk     01/12/2024    1:09 PM 10/06/2023   10:20 AM 04/06/2023    9:06 AM 01/07/2023   12:41 PM 10/02/2022   10:08 AM  Fall Risk   Falls in the past year? 0 0 0 0 0  Number falls in past yr: 0 0 0 0 0  Injury with Fall? 0 0 0 0 0  Risk for fall due to : No Fall Risks No Fall Risks No Fall Risks No Fall Risks No Fall Risks  Follow up Falls evaluation completed;Falls prevention discussed Falls evaluation completed;Education provided Falls evaluation completed Falls prevention discussed;Falls evaluation completed Falls evaluation completed    MEDICARE RISK AT HOME:  Medicare Risk at Home Any stairs in or around the home?: Yes If so, are there any without handrails?: No Home free of loose throw rugs in walkways, pet beds, electrical cords, etc?: Yes Adequate lighting in your home to reduce risk of falls?: Yes Life alert?: Yes (Alexis) Use of a cane, walker or w/c?: No Grab bars in the bathroom?: No Shower chair or bench in shower?: No Elevated toilet seat or a handicapped toilet?: No  TIMED UP AND GO:  Was the test performed?  No  Cognitive Function: 6CIT completed    09/02/2019   10:58 AM 08/24/2017   11:44 AM 08/21/2016   11:30 AM  MMSE - Mini Mental State Exam  Not completed: Unable to complete    Orientation to time   5 5   Orientation to Place  5 5   Registration  3 3   Attention/ Calculation  5 5   Recall  2 3   Language- name 2 objects  2 2   Language- repeat  1 1  Language- follow 3 step command  3 3   Language- read & follow direction  1 1   Write a sentence  1 1   Copy design  1 1   Total score  29 30      Data saved with a previous flowsheet row definition        01/12/2024    1:18 PM 01/07/2023   12:54 PM 08/27/2018    9:27 AM  6CIT Screen  What Year? 0 points 0 points 0 points  What month? 0 points 0 points 0 points  What time? 0 points 0 points 0 points  Count back from 20 0 points 0 points 0 points  Months in reverse 0 points 0 points 0 points  Repeat phrase 0 points 0 points 0 points  Total Score 0 points 0 points 0 points    Immunizations Immunization History  Administered Date(s) Administered  Fluad Quad(high Dose 65+) 03/11/2019, 03/06/2021   Fluad Trivalent(High Dose 65+) 04/06/2023   Influenza,inj,Quad PF,6+ Mos 04/05/2014, 02/05/2015, 01/23/2016, 04/06/2020   PFIZER(Purple Top)SARS-COV-2 Vaccination 01/16/2020, 02/09/2020   PPD Test 06/29/2020, 12/06/2021   Pfizer Covid-19 Vaccine Bivalent Booster 66yrs & up 03/22/2021   Pneumococcal Conjugate-13 01/11/2014   Pneumococcal Polysaccharide-23 12/03/2015   Td 08/29/1988, 12/15/2003   Tdap 01/11/2014    Screening Tests Health Maintenance  Topic Date Due   COVID-19 Vaccine (4 - 2024-25 season) 01/25/2023   DTaP/Tdap/Td (4 - Td or Tdap) 01/12/2024   INFLUENZA VACCINE  12/25/2023   MAMMOGRAM  11/16/2024   Medicare Annual Wellness (AWV)  01/11/2025   Colonoscopy  06/03/2026   Pneumococcal Vaccine: 50+ Years  Completed   DEXA SCAN  Completed   Hepatitis C Screening  Completed   HPV VACCINES  Aged Out   Meningococcal B Vaccine  Aged Out   Pneumococcal Vaccine  Discontinued   Zoster Vaccines- Shingrix  Discontinued    Health Maintenance  Health Maintenance Due  Topic Date Due   COVID-19 Vaccine (4 - 2024-25  season) 01/25/2023   DTaP/Tdap/Td (4 - Td or Tdap) 01/12/2024   INFLUENZA VACCINE  12/25/2023   Health Maintenance Items Addressed: Discussed the need to update tetanus and flu vaccines.  Patient declines covid vaccine at this time.  Additional Screening:  Vision Screening: Recommended annual ophthalmology exams for early detection of glaucoma and other disorders of the eye.Up to date [facility] Would you like a referral to an eye doctor? No    Dental Screening: Recommended annual dental exams for proper oral hygiene  Community Resource Referral / Chronic Care Management: CRR required this visit?  No   CCM required this visit?  No   Plan:    I have personally reviewed and noted the following in the patient's chart:   Medical and social history Use of alcohol, tobacco or illicit drugs  Current medications and supplements including opioid prescriptions. Patient is not currently taking opioid prescriptions. Functional ability and status Nutritional status Physical activity Advanced directives List of other physicians Hospitalizations, surgeries, and ER visits in previous 12 months Vitals Screenings to include cognitive, depression, and falls Referrals and appointments  In addition, I have reviewed and discussed with patient certain preventive protocols, quality metrics, and best practice recommendations. A written personalized care plan for preventive services as well as general preventive health recommendations were provided to patient.   Angeline Fredericks, LPN   1/80/7974   After Visit Summary: (MyChart) Due to this being a telephonic visit, the after visit summary with patients personalized plan was offered to patient via MyChart   Notes: Nothing significant to report at this time.

## 2024-01-13 DIAGNOSIS — M7062 Trochanteric bursitis, left hip: Secondary | ICD-10-CM | POA: Diagnosis not present

## 2024-01-13 DIAGNOSIS — M47816 Spondylosis without myelopathy or radiculopathy, lumbar region: Secondary | ICD-10-CM | POA: Diagnosis not present

## 2024-01-13 DIAGNOSIS — M5416 Radiculopathy, lumbar region: Secondary | ICD-10-CM | POA: Diagnosis not present

## 2024-01-13 DIAGNOSIS — M1612 Unilateral primary osteoarthritis, left hip: Secondary | ICD-10-CM | POA: Diagnosis not present

## 2024-01-13 DIAGNOSIS — M7632 Iliotibial band syndrome, left leg: Secondary | ICD-10-CM | POA: Diagnosis not present

## 2024-01-13 DIAGNOSIS — M25552 Pain in left hip: Secondary | ICD-10-CM | POA: Diagnosis not present

## 2024-01-26 DIAGNOSIS — H353221 Exudative age-related macular degeneration, left eye, with active choroidal neovascularization: Secondary | ICD-10-CM | POA: Diagnosis not present

## 2024-01-26 DIAGNOSIS — H2511 Age-related nuclear cataract, right eye: Secondary | ICD-10-CM | POA: Diagnosis not present

## 2024-01-26 DIAGNOSIS — Z961 Presence of intraocular lens: Secondary | ICD-10-CM | POA: Diagnosis not present

## 2024-01-28 ENCOUNTER — Other Ambulatory Visit: Payer: Self-pay

## 2024-01-28 DIAGNOSIS — M1990 Unspecified osteoarthritis, unspecified site: Secondary | ICD-10-CM

## 2024-01-28 MED ORDER — DICLOFENAC SODIUM 75 MG PO TBEC: 75.0000 mg | DELAYED_RELEASE_TABLET | Freq: Every day | ORAL | 0 refills | Status: DC

## 2024-02-15 DIAGNOSIS — M7631 Iliotibial band syndrome, right leg: Secondary | ICD-10-CM | POA: Diagnosis not present

## 2024-02-15 DIAGNOSIS — M47816 Spondylosis without myelopathy or radiculopathy, lumbar region: Secondary | ICD-10-CM | POA: Diagnosis not present

## 2024-02-15 DIAGNOSIS — M7061 Trochanteric bursitis, right hip: Secondary | ICD-10-CM | POA: Diagnosis not present

## 2024-02-15 DIAGNOSIS — M5416 Radiculopathy, lumbar region: Secondary | ICD-10-CM | POA: Diagnosis not present

## 2024-02-22 DIAGNOSIS — N816 Rectocele: Secondary | ICD-10-CM | POA: Diagnosis not present

## 2024-02-22 DIAGNOSIS — Z4689 Encounter for fitting and adjustment of other specified devices: Secondary | ICD-10-CM | POA: Diagnosis not present

## 2024-03-08 DIAGNOSIS — H353221 Exudative age-related macular degeneration, left eye, with active choroidal neovascularization: Secondary | ICD-10-CM | POA: Diagnosis not present

## 2024-03-27 ENCOUNTER — Emergency Department (HOSPITAL_COMMUNITY)
Admission: EM | Admit: 2024-03-27 | Discharge: 2024-03-27 | Disposition: A | Discharge status: Home / Self Care | Attending: Emergency Medicine | Admitting: Emergency Medicine

## 2024-03-27 ENCOUNTER — Encounter (HOSPITAL_COMMUNITY): Payer: Self-pay | Admitting: *Deleted

## 2024-03-27 ENCOUNTER — Emergency Department (HOSPITAL_COMMUNITY)

## 2024-03-27 ENCOUNTER — Other Ambulatory Visit: Payer: Self-pay

## 2024-03-27 DIAGNOSIS — M79662 Pain in left lower leg: Secondary | ICD-10-CM | POA: Diagnosis not present

## 2024-03-27 DIAGNOSIS — X58XXXA Exposure to other specified factors, initial encounter: Secondary | ICD-10-CM | POA: Insufficient documentation

## 2024-03-27 DIAGNOSIS — S32000A Wedge compression fracture of unspecified lumbar vertebra, initial encounter for closed fracture: Secondary | ICD-10-CM

## 2024-03-27 DIAGNOSIS — S32028A Other fracture of second lumbar vertebra, initial encounter for closed fracture: Secondary | ICD-10-CM | POA: Insufficient documentation

## 2024-03-27 DIAGNOSIS — S32038A Other fracture of third lumbar vertebra, initial encounter for closed fracture: Secondary | ICD-10-CM | POA: Diagnosis not present

## 2024-03-27 DIAGNOSIS — M545 Low back pain, unspecified: Secondary | ICD-10-CM | POA: Diagnosis present

## 2024-03-27 DIAGNOSIS — N3000 Acute cystitis without hematuria: Secondary | ICD-10-CM | POA: Insufficient documentation

## 2024-03-27 LAB — CBC WITH DIFFERENTIAL/PLATELET
Abs Immature Granulocytes: 0.03 K/uL (ref 0.00–0.07)
Basophils Absolute: 0 K/uL (ref 0.0–0.1)
Basophils Relative: 1 %
Eosinophils Absolute: 0 K/uL (ref 0.0–0.5)
Eosinophils Relative: 1 %
HCT: 37 % (ref 36.0–46.0)
Hemoglobin: 11.7 g/dL — ABNORMAL LOW (ref 12.0–15.0)
Immature Granulocytes: 1 %
Lymphocytes Relative: 21 %
Lymphs Abs: 1.2 K/uL (ref 0.7–4.0)
MCH: 29.3 pg (ref 26.0–34.0)
MCHC: 31.6 g/dL (ref 30.0–36.0)
MCV: 92.7 fL (ref 80.0–100.0)
Monocytes Absolute: 0.4 K/uL (ref 0.1–1.0)
Monocytes Relative: 8 %
Neutro Abs: 3.8 K/uL (ref 1.7–7.7)
Neutrophils Relative %: 68 %
Platelets: 274 K/uL (ref 150–400)
RBC: 3.99 MIL/uL (ref 3.87–5.11)
RDW: 13.9 % (ref 11.5–15.5)
WBC: 5.4 K/uL (ref 4.0–10.5)
nRBC: 0 % (ref 0.0–0.2)

## 2024-03-27 LAB — URINALYSIS, ROUTINE W REFLEX MICROSCOPIC
Bilirubin Urine: NEGATIVE
Glucose, UA: NEGATIVE mg/dL
Hgb urine dipstick: NEGATIVE
Ketones, ur: NEGATIVE mg/dL
Nitrite: NEGATIVE
Protein, ur: 30 mg/dL — AB
Specific Gravity, Urine: 1.021 (ref 1.005–1.030)
pH: 5 (ref 5.0–8.0)

## 2024-03-27 LAB — COMPREHENSIVE METABOLIC PANEL WITH GFR
ALT: 18 U/L (ref 0–44)
AST: 22 U/L (ref 15–41)
Albumin: 3.7 g/dL (ref 3.5–5.0)
Alkaline Phosphatase: 94 U/L (ref 38–126)
Anion gap: 12 (ref 5–15)
BUN: 24 mg/dL — ABNORMAL HIGH (ref 8–23)
CO2: 24 mmol/L (ref 22–32)
Calcium: 9.7 mg/dL (ref 8.9–10.3)
Chloride: 101 mmol/L (ref 98–111)
Creatinine, Ser: 1.58 mg/dL — ABNORMAL HIGH (ref 0.44–1.00)
GFR, Estimated: 34 mL/min — ABNORMAL LOW (ref >60)
Glucose, Bld: 118 mg/dL — ABNORMAL HIGH (ref 70–99)
Potassium: 4.4 mmol/L (ref 3.5–5.1)
Sodium: 137 mmol/L (ref 135–145)
Total Bilirubin: 0.7 mg/dL (ref 0.0–1.2)
Total Protein: 7.3 g/dL (ref 6.5–8.1)

## 2024-03-27 LAB — I-STAT CHEM 8, ED
BUN: 28 mg/dL — ABNORMAL HIGH (ref 8–23)
Calcium, Ion: 1.18 mmol/L (ref 1.15–1.40)
Chloride: 103 mmol/L (ref 98–111)
Creatinine, Ser: 1.3 mg/dL — ABNORMAL HIGH (ref 0.44–1.00)
Glucose, Bld: 84 mg/dL (ref 70–99)
HCT: 30 % — ABNORMAL LOW (ref 36.0–46.0)
Hemoglobin: 10.2 g/dL — ABNORMAL LOW (ref 12.0–15.0)
Potassium: 4.6 mmol/L (ref 3.5–5.1)
Sodium: 137 mmol/L (ref 135–145)
TCO2: 26 mmol/L (ref 22–32)

## 2024-03-27 LAB — LIPASE, BLOOD: Lipase: 39 U/L (ref 11–51)

## 2024-03-27 MED ORDER — SODIUM CHLORIDE 0.9 % IV BOLUS
1000.0000 mL | Freq: Once | INTRAVENOUS | Status: AC
  Administered 2024-03-27: 1000 mL via INTRAVENOUS

## 2024-03-27 MED ORDER — METHOCARBAMOL 500 MG PO TABS: 500.0000 mg | ORAL_TABLET | Freq: Two times a day (BID) | ORAL | 0 refills | Status: DC

## 2024-03-27 MED ORDER — CEPHALEXIN 500 MG PO CAPS: 500.0000 mg | ORAL_CAPSULE | Freq: Four times a day (QID) | ORAL | 0 refills | Status: DC

## 2024-03-27 MED ORDER — SODIUM CHLORIDE 0.9 % IV SOLN
1.0000 g | Freq: Once | INTRAVENOUS | Status: AC
  Administered 2024-03-27: 1 g via INTRAVENOUS
  Filled 2024-03-27: qty 10

## 2024-03-27 MED ORDER — TRAMADOL HCL 50 MG PO TABS: 50.0000 mg | ORAL_TABLET | Freq: Four times a day (QID) | ORAL | 0 refills | Status: DC | PRN

## 2024-03-27 MED ORDER — TRAMADOL HCL 50 MG PO TABS
50.0000 mg | ORAL_TABLET | Freq: Once | ORAL | Status: AC
  Administered 2024-03-27: 50 mg via ORAL
  Filled 2024-03-27: qty 1

## 2024-03-27 MED ORDER — METHOCARBAMOL 500 MG PO TABS
500.0000 mg | ORAL_TABLET | Freq: Once | ORAL | Status: AC
  Administered 2024-03-27: 500 mg via ORAL
  Filled 2024-03-27: qty 1

## 2024-03-27 NOTE — ED Triage Notes (Signed)
 The pt reports that she  has been ill for the entire month of October she has a prolapse of ??unteru pain innher lt lower leg she thinks she may have a blood clot  just not feeling  well

## 2024-03-27 NOTE — Discharge Instructions (Signed)
 As we discussed, you have lumbar compression fractures on your xray tonight, likely contributing to your back, hip and low abdominal pain. You also have an infection in your urine and have been prescribed antibiotics to treat this. You have been prescribed Tramadol for pain and Robaxin (aka methocarbamol) as a muscle relaxer, which helped you feel better in the ED. Take these at home as prescribed.   Follow up with your doctor in 3 days to recheck how your pain is managed. You will also need to review the urine culture sent tonight to verify the antibiotic treatment for the urine infection.  Return to the ED with any new or concerning symptoms, including uncontrolled pain, at any time.

## 2024-03-27 NOTE — ED Provider Notes (Signed)
 [facility] EMERGENCY DEPARTMENT AT [facility] Provider Note   CSN: [id number] Arrival date & time: 03/27/24  1523     Patient presents with: multiple complaints   Madison Craig is a 78 y.o. female.   Patient to ED for evaluation of progressively worsening low back pain that radiates to bilateral hips causing a burning sensation into bilateral thighs, and also radiates across the lower abdomen. She reports being seen by orthopedics at [facility] and receiving an injection in the left hip that offered some relief of hip pain, but not low back or right hip areas. She denies bowel or bladder dysfunction. She states that when she gets up over the last 2-3 days, her legs feels numb. No falls or reported injuries.   The history is provided by the patient.       Prior to Admission medications   Medication Sig Start Date End Date Taking? Authorizing Provider  cephALEXin (KEFLEX) 500 MG capsule Take 1 capsule (500 mg total) by mouth 4 (four) times daily. 03/27/24  Yes Amalia Edgecombe, Margit, PA-C  methocarbamol (ROBAXIN) 500 MG tablet Take 1 tablet (500 mg total) by mouth 2 (two) times daily. 03/27/24  Yes Sharmane Dame, Margit, PA-C  traMADol (ULTRAM) 50 MG tablet Take 1 tablet (50 mg total) by mouth every 6 (six) hours as needed. 03/27/24  Yes Shonika Kolasinski, Margit, PA-C  Acetaminophen  (TYLENOL  PO) Take by mouth as needed.    [provider]  amLODipine  (NORVASC ) 2.5 MG tablet Take 1 tablet (2.5 mg total) by mouth at bedtime. 11/04/23   Hope Merle, MD  azelastine  (ASTELIN ) 0.1 % nasal spray Place 2 sprays into both nostrils 2 (two) times daily. Use in each nostril as directed 10/06/23   Hope Merle, MD  cetirizine (ZYRTEC) 10 MG chewable tablet Chew 10 mg by mouth daily.    [provider]  cholecalciferol (VITAMIN D3) 25 MCG (1000 UNIT) tablet Take 2,000 Units by mouth daily.    [provider]  diclofenac  (VOLTAREN ) 75 MG EC tablet Take 1 tablet (75 mg total) by  mouth daily. 01/28/24   Marylynn Verneita CROME, MD  omeprazole  (PRILOSEC) 40 MG capsule Take 1 capsule (40 mg total) by mouth daily. 10/06/23   Hope Merle, MD    Allergies: Patient has no known allergies.    Review of Systems  Updated Vital Signs BP (!) 143/75   Pulse 69   Temp 97.9 F (36.6 C) (Oral)   Resp 16   Ht 5' 2 (1.575 m)   Wt 78.9 kg   SpO2 99%   BMI 31.81 kg/m   Physical Exam Constitutional:      General: She is not in acute distress.    Appearance: She is well-developed. She is not ill-appearing.  Pulmonary:     Effort: Pulmonary effort is normal.  Abdominal:     General: There is no distension.     Palpations: Abdomen is soft.     Tenderness: There is no abdominal tenderness.  Musculoskeletal:        General: Normal range of motion.     Cervical back: Normal range of motion.     Comments: Low back nontender to palpation. No swelling. FROM LE's with equal and symmetric strength. Distal pulses intact. There is a tender area to anteromedial left proximal lower left leg.   Skin:    General: Skin is warm and dry.  Neurological:     Mental Status: She is alert and oriented to person, place, and  time.     (all labs ordered are listed, but only abnormal results are displayed) Labs Reviewed  COMPREHENSIVE METABOLIC PANEL WITH GFR - Abnormal; Notable for the following components:      Result Value   Glucose, Bld 118 (*)    BUN 24 (*)    Creatinine, Ser 1.58 (*)    GFR, Estimated 34 (*)    All other components within normal limits  CBC WITH DIFFERENTIAL/PLATELET - Abnormal; Notable for the following components:   Hemoglobin 11.7 (*)    All other components within normal limits  URINALYSIS, ROUTINE W REFLEX MICROSCOPIC - Abnormal; Notable for the following components:   APPearance HAZY (*)    Protein, ur 30 (*)    Leukocytes,Ua LARGE (*)    Bacteria, UA MANY (*)    All other components within normal limits  I-STAT CHEM 8, ED - Abnormal; Notable for the following  components:   BUN 28 (*)    Creatinine, Ser 1.30 (*)    Hemoglobin 10.2 (*)    HCT 30.0 (*)    All other components within normal limits  URINE CULTURE  LIPASE, BLOOD    EKG: None  Radiology: VAS US  LOWER EXTREMITY VENOUS (DVT) (7a-7p) Result Date: 03/27/2024  Lower Venous DVT Study Patient Name:  Madison Craig  Date of Exam:   03/27/2024 Medical Rec #: [id number]              Accession #:    [id number] Date of Birth: 06-06-1945              Patient Gender: F Patient Age:   89 years Exam Location:  [facility] Procedure:      VAS US  LOWER EXTREMITY VENOUS (DVT) Referring Phys: WARREN BARRETT --------------------------------------------------------------------------------  Indications: Pain, and Palpable Cord.  Risk Factors: Known reflux disease in the bilateral lower extremities, diagnosed at [facility] 11/06/20. Post ablation of right GSV 04/17/21. Comparison Study: No prior left LEV on file Performing Technologist: Alberta Lis RVS  Examination Guidelines: A complete evaluation includes B-mode imaging, spectral Doppler, color Doppler, and power Doppler as needed of all accessible portions of each vessel. Bilateral testing is considered an integral part of a complete examination. Limited examinations for reoccurring indications may be performed as noted. The reflux portion of the exam is performed with the patient in reverse Trendelenburg.  +-----+---------------+---------+-----------+----------+--------------+ RIGHTCompressibilityPhasicitySpontaneityPropertiesThrombus Aging +-----+---------------+---------+-----------+----------+--------------+ CFV  Full           Yes      Yes                                 +-----+---------------+---------+-----------+----------+--------------+ SFJ  Full                                                        +-----+---------------+---------+-----------+----------+--------------+    +---------+---------------+---------+-----------+----------+-------------------+ LEFT     CompressibilityPhasicitySpontaneityPropertiesThrombus Aging      +---------+---------------+---------+-----------+----------+-------------------+ CFV      Full           Yes      Yes                                      +---------+---------------+---------+-----------+----------+-------------------+  SFJ      Full                                                             +---------+---------------+---------+-----------+----------+-------------------+ FV Prox  Full           Yes      Yes                                      +---------+---------------+---------+-----------+----------+-------------------+ FV Mid   Full                                                             +---------+---------------+---------+-----------+----------+-------------------+ FV DistalFull                                                             +---------+---------------+---------+-----------+----------+-------------------+ PFV      Full           Yes      Yes                                      +---------+---------------+---------+-----------+----------+-------------------+ POP      Full                                                             +---------+---------------+---------+-----------+----------+-------------------+ PTV      Full                                                             +---------+---------------+---------+-----------+----------+-------------------+ PERO     Full                                                             +---------+---------------+---------+-----------+----------+-------------------+ GSV      Full                                         Patent throughout  thigh to knee       +---------+---------------+---------+-----------+----------+-------------------+  GSV/VV   None                                         Acute               +---------+---------------+---------+-----------+----------+-------------------+     Summary: RIGHT: - No evidence of common femoral vein obstruction.   LEFT: - Findings consistent with acute superficial vein thrombosis involving the left varicosities and greater saphenous vein in the proximal calf at site of knotting.  - There is no evidence of deep vein thrombosis in the lower extremity.  - No cystic structure found in the popliteal fossa.  *See table(s) above for measurements and observations.    Preliminary    DG HIP PORT UNILAT WITH PELVIS 1V LEFT Result Date: 03/27/2024 CLINICAL DATA:  Low back pain. EXAM: DG HIP (WITH OR WITHOUT PELVIS) 1V PORT LEFT COMPARISON:  None Available. FINDINGS: There is no evidence of an acute hip fracture or dislocation. Moderate severity degenerative changes are seen in the form of joint space narrowing, acetabular sclerosis and lateral acetabular bony spurring. Additional degenerative changes are seen within the lower lumbar spine. IMPRESSION: Moderate severity degenerative changes of the left hip. Electronically Signed   By: Suzen Dials M.D.   On: 03/27/2024 18:21   DG HIP UNILAT WITH PELVIS 1V RIGHT Result Date: 03/27/2024 CLINICAL DATA:  Low back pain. EXAM: DG HIP (WITH OR WITHOUT PELVIS) 1V RIGHT COMPARISON:  None Available. FINDINGS: There is no evidence of hip fracture or dislocation. Degenerative changes are seen in the form of joint space narrowing and acetabular sclerosis. IMPRESSION: Degenerative changes in the right hip. Electronically Signed   By: Suzen Dials M.D.   On: 03/27/2024 18:19   DG Lumbar Spine Complete Result Date: 03/27/2024 CLINICAL DATA:  Low back pain. EXAM: LUMBAR SPINE - COMPLETE 4+ VIEW COMPARISON:  None Available. FINDINGS: Compression fracture deformities are seen involving the L2 and L3 vertebral bodies with an additional fracture deformity noted  along the superior endplate of L5. These are of indeterminate age. There is mild levoscoliosis with approximately 5 mm anterolisthesis of the L4 vertebral body on L5. There is multilevel endplate sclerosis with marked severity anterior and lateral osteophyte formation noted at the levels of L1-L2 and L2-L3. Multilevel bilateral facet joint hypertrophy is seen throughout the lumbar spine. Moderate severity intervertebral disc space narrowing is seen at the levels of L1-L2 and L2-L3. IMPRESSION: 1. Compression fracture deformities of indeterminate age involving the L2 and L3 vertebral bodies with an additional fracture deformity noted along the superior endplate of L5. 2. Multilevel degenerative changes, as described above. Electronically Signed   By: Suzen Dials M.D.   On: 03/27/2024 18:15     Procedures   Medications Ordered in the ED  traMADol (ULTRAM) tablet 50 mg (50 mg Oral Given 03/27/24 1712)  methocarbamol (ROBAXIN) tablet 500 mg (500 mg Oral Given 03/27/24 1714)  sodium chloride  0.9 % bolus 1,000 mL (0 mLs Intravenous Stopped 03/27/24 2051)  cefTRIAXone (ROCEPHIN) 1 g in sodium chloride  0.9 % 100 mL IVPB (0 g Intravenous Stopped 03/27/24 2042)    Clinical Course as of 03/27/24 2205  Sun Mar 27, 2024  8162 Patient presents with complaint of low back pain that extends to bilateral hips, thighs and across low abdomen, progressive over the last week. She  reports burning sensation in thighs and an area of tenderness to left lower leg concerning for DVT. H/O dVT in the past. No SOB, CP, fever, nausea.   She is overall well appearing. No neurologic deficits on exam. No reproducible tenderness of the back or abdomen. There is an area to the proximal left lower leg that is tender, cordlike, c/w superficial thrombophlebitis or varicosity.    [SU]  1911 Labs significant for like UTI - culture added. Will start Keflex. Encourage review of culture with PCP in 2-3 days. Cr 1.58 indicating mild  AKI.  Lumbar x ray per radiology:  IMPRESSION: 1. Compression fracture deformities of indeterminate age involving the L2 and L3 vertebral bodies with an additional fracture deformity noted along the superior endplate of L5. 2. Multilevel degenerative changes, as described above.   On recheck, she is feeling better with Tramadol and Robaxin. She has been ambulatory to the bathroom.    [SU]  1934 Will provide a liter of IV fluids and recheck Cr afterward. The patient reports she wants to be discharged home. Reviewed with Dr. Pamella who agrees with plan. First dose abx for UTI provided IV with Rocephin.  [SU]    Clinical Course User Index [SU] Odell Balls, PA-C                                 Medical Decision Making Amount and/or Complexity of Data Reviewed Labs: ordered. Radiology: ordered.  Risk Prescription drug management.        Final diagnoses:  Compression fracture of lumbar vertebra, unspecified lumbar vertebral level, initial encounter (HCC)  Acute cystitis without hematuria    ED Discharge Orders          Ordered    cephALEXin (KEFLEX) 500 MG capsule  4 times daily        03/27/24 2151    methocarbamol (ROBAXIN) 500 MG tablet  2 times daily        03/27/24 2151    traMADol (ULTRAM) 50 MG tablet  Every 6 hours PRN        03/27/24 2151               Odell Balls, PA-C 03/27/24 2205    Pamella Ozell LABOR, DO 03/29/24 0119

## 2024-03-27 NOTE — Progress Notes (Signed)
 VASCULAR LAB    Left lower extremity venous duplex has been performed.  See CV proc for preliminary results.  Gave verbal report to Margit Paris, PA-C  Blu Lori, RVT 03/27/2024, 6:23 PM

## 2024-03-27 NOTE — ED Provider Triage Note (Signed)
 Emergency Medicine Provider Triage Evaluation Note  Madison Craig , a 78 y.o. female  was evaluated in triage.  Pt complains of a week of prominence of left leg veins and left calf pain. Complaint of lower abdominal pain and also worsening of low back pain.  No fever or chills.  Does have some leg pain and weakness with this.  Review of Systems  Positive: Back pain Negative: Fever  Physical Exam  BP 136/80   Pulse 95   Temp 97.8 F (36.6 C)   Resp 18   Ht 5' 2 (1.575 m)   Wt 78.9 kg   SpO2 98%   BMI 31.81 kg/m  Gen:   Awake, no distress   Resp:  Normal effort  MSK:   Moves extremities without difficulty  Other:    Medical Decision Making  Medically screening exam initiated at 3:50 PM.  Appropriate orders placed.  Madison Craig was informed that the remainder of the evaluation will be completed by another provider, this initial triage assessment does not replace that evaluation, and the importance of remaining in the ED until their evaluation is complete.     Madison Warren SAILOR, PA-C 03/27/24 1551

## 2024-03-29 LAB — URINE CULTURE: Culture: >=100000 — AB

## 2024-03-30 ENCOUNTER — Telehealth (HOSPITAL_BASED_OUTPATIENT_CLINIC_OR_DEPARTMENT_OTHER): Payer: Self-pay | Admitting: *Deleted

## 2024-03-30 NOTE — Telephone Encounter (Signed)
 Post ED Visit - Positive Culture Follow-up  Culture report reviewed by antimicrobial stewardship pharmacist: [facility] [x]  Maurilio Patten Pharm.D. []  Venetia Gully, Pharm.D., BCPS AQ-ID []  Garrel Crews, Pharm.D., BCPS []  Almarie Lunger, Pharm.D., BCPS []  Horine, [place].D., BCPS, AAHIVP []  Rosaline Bihari, Pharm.D., BCPS, AAHIVP []  Vernell Meier, PharmD, BCPS []  Latanya Hint, PharmD, BCPS []  Donald Medley, PharmD, BCPS []  Rocky Bold, PharmD []  Dorothyann Alert, PharmD, BCPS []  Morene Babe, PharmD  [facility] []  Rosaline Edison, PharmD []  Romona Bliss, PharmD []  Dolphus Roller, PharmD []  Veva Seip, Rph []  Vernell Daunt) Leonce, PharmD []  Eva Allis, PharmD []  Rosaline Millet, PharmD []  Iantha Batch, PharmD []  Arvin Gauss, PharmD []  Wanda Hasting, PharmD []  Ronal Rav, PharmD []  Rocky Slade, PharmD []  Bard Jeans, PharmD   Positive urine culture Treated with Cephalexin, organism sensitive to the same and no further patient follow-up is required at this time.  Jama Lisle Blondie 03/30/2024, 12:12 PM

## 2024-03-31 ENCOUNTER — Ambulatory Visit: Admitting: Nurse Practitioner

## 2024-03-31 ENCOUNTER — Encounter: Payer: Self-pay | Admitting: Nurse Practitioner

## 2024-03-31 VITALS — BP 112/74 | HR 69 | Temp 97.7°F | Ht 62.0 in | Wt 154.0 lb

## 2024-03-31 DIAGNOSIS — S32000S Wedge compression fracture of unspecified lumbar vertebra, sequela: Secondary | ICD-10-CM | POA: Diagnosis not present

## 2024-03-31 DIAGNOSIS — N8110 Cystocele, unspecified: Secondary | ICD-10-CM | POA: Diagnosis not present

## 2024-03-31 DIAGNOSIS — M1990 Unspecified osteoarthritis, unspecified site: Secondary | ICD-10-CM | POA: Diagnosis not present

## 2024-03-31 DIAGNOSIS — K5900 Constipation, unspecified: Secondary | ICD-10-CM | POA: Diagnosis not present

## 2024-03-31 DIAGNOSIS — R110 Nausea: Secondary | ICD-10-CM

## 2024-03-31 DIAGNOSIS — S32000A Wedge compression fracture of unspecified lumbar vertebra, initial encounter for closed fracture: Secondary | ICD-10-CM

## 2024-03-31 DIAGNOSIS — N3000 Acute cystitis without hematuria: Secondary | ICD-10-CM

## 2024-03-31 MED ORDER — DICLOFENAC SODIUM 75 MG PO TBEC: 75.0000 mg | DELAYED_RELEASE_TABLET | Freq: Every day | ORAL | 0 refills | Status: DC

## 2024-03-31 MED ORDER — ONDANSETRON HCL 4 MG PO TABS: 4.0000 mg | ORAL_TABLET | Freq: Three times a day (TID) | ORAL | 0 refills | Status: DC | PRN

## 2024-03-31 NOTE — Progress Notes (Signed)
 Established Patient Office Visit  Subjective:  Patient ID: Madison Craig, female    DOB: [birth year]  Age: 78 y.o. MRN: [id number]  CC:  Chief Complaint  Patient presents with   Follow-up    ED follow up   Discussed the use of AI scribe software for clinical note transcription with the patient, who gave verbal consent to proceed.  History of Present Illness Discussed the use of AI scribe software for clinical note transcription with the patient, who gave verbal consent to proceed.  History of Present Illness   Madison Craig is a 78 year old female who presents for follow-up from the emergency department for a compression fracture. She is accompanied by her daughter.  She experiences severe pain in her hips and back, extending to the lower abdomen. Tramadol provides some relief. She has orthopedic appointment is scheduled for the twelfth.  She has nausea and decreased appetite for two to three months, resulting in weight loss from 174 to 154 pounds. She feels unwell after eating small amounts and has not eaten properly for months. No blood in urine or feces.  She has not had a bowel movement in a week, attributing it to constipation, with no urge to defecate and experiencing significant gas. No medication taken for constipation.  She was diagnosed with a urinary tract infection in the emergency department and is taking Keflex. She experiences frequent urination.  She reports a prolapsed vagina, related to her bladder and uterus, and seeks a referral to a urogynecologist after previous pessary treatment was ineffective.    She is requesting refill for diclofenac .  Past Medical History:  Diagnosis Date   Anemia    Arthritis    Basal cell carcinoma    Benign neoplasm of ascending colon    Dyspnea 09/28/2013   Pulmonary function testing done on 02/09/2014 showed minimal airway obstruction with a lack of response to bronchodilators; her FEV1 was normal, the FEV1/FVC  ratio and the FEF 25-75% were reduced; the airway resistance was normal; lung volumes were within normal limits; the diffusing capacity was high for the measured volumes.     GERD (gastroesophageal reflux disease)    Heart murmur    Heme + stool    Macular degeneration 01/11/2014   Followed at [facility] in [place], [place].    Multiple thyroid  nodules    Thyroid  ultrasound on 01/25/2014 showed a multinodular goiter, with a 1.5 cm nodule in the inferior left thyroid  lobe that met the criteria for ultrasound-guided biopsy.  On 02/06/2014 an ultrasound guided needle aspirate biopsy was performed of the left inferior thyroid  nodule; cytopathology findings were consistent with non-neoplastic goiter.      Osteoporosis 09/18/2014   DXA 08/16/2014 showed osteopenia of right femur neck (T-score -2.3) and osteoporosis of left forearm radius (T-score -2.9).  Lumbar spine was not utilized due to advanced degenerative changes.    Reflux esophagitis    SLEEP APNEA 03/22/2009   Had sleep study in [place] about 10 years, has BiPAP, cannot sleep with it on.      Past Surgical History:  Procedure Laterality Date   BREAST SURGERY  1988   breast biopsy   CARDIAC CATHETERIZATION  2006   No Stents/[facility]   COLONOSCOPY WITH PROPOFOL  N/A 04/24/2017   Procedure: COLONOSCOPY WITH PROPOFOL ;  Surgeon: Janalyn Keene NOVAK, MD;  Location: [facility] ENDOSCOPY;  Service: Endoscopy;  Laterality: N/A;   COLONOSCOPY WITH PROPOFOL  N/A 06/04/2023   Procedure: COLONOSCOPY WITH PROPOFOL ;  Surgeon: Unk,  Corinn Skiff, MD;  Location: [facility] ENDOSCOPY;  Service: Gastroenterology;  Laterality: N/A;   ENDOVENOUS ABLATION SAPHENOUS VEIN W/ LASER Right 04/17/2021   endovenous laser ablation right greater saphenous vein and stab phlebectomy > 20 incisions right leg by Medford Blade MD   ESOPHAGOGASTRODUODENOSCOPY (EGD) WITH PROPOFOL  N/A 04/24/2017   Procedure: ESOPHAGOGASTRODUODENOSCOPY (EGD) WITH PROPOFOL ;  Surgeon: Janalyn Keene NOVAK,  MD;  Location: [facility] ENDOSCOPY;  Service: Endoscopy;  Laterality: N/A;   eye     cateract surgery   INCISION AND DRAINAGE OF WOUND Left 05/05/2023   Procedure: IRRIGATION AND DEBRIDEMENT WOUND LEFT LONG FINGER;  Surgeon: Edie Norleen PARAS, MD;  Location: [facility] ORS;  Service: Orthopedics;  Laterality: Left;    Family History  Problem Relation Age of Onset   Stroke Mother    Hypertension Mother    Dementia Mother    Heart attack Mother    Atrial fibrillation Mother    Heart disease Father 48   Sudden death Father    Hypertension Father    Hyperlipidemia Father    Heart attack Father    Stroke Sister 67   Alcohol abuse Sister    Valvular heart disease Sister    Atrial fibrillation Sister    Aneurysm Sister    Arrhythmia Brother    Atrial fibrillation Brother    Diabetes Son 58   Hodgkin's lymphoma Son    Colon cancer Neg Hx    Breast cancer Neg Hx     Social History   Socioeconomic History   Marital status: Widowed    Spouse name: Not on file   Number of children: 3   Years of education: Not on file   Highest education level: Not on file  Occupational History   Not on file  Tobacco Use   Smoking status: Former    Current packs/day: 0.00    Average packs/day: 2.0 packs/day for 16.0 years (32.0 ttl pk-yrs)    Types: Cigarettes    Start date: 09/28/1965    Quit date: 09/28/1981    Years since quitting: 42.5   Smokeless tobacco: Never  Vaping Use   Vaping status: Never Used  Substance and Sexual Activity   Alcohol use: No   Drug use: No   Sexual activity: Not Currently    Birth control/protection: Post-menopausal  Other Topics Concern   Not on file  Social History Narrative   Widow   Social Drivers of Health   Financial Resource Strain: Low Risk  (01/12/2024)   Overall Financial Resource Strain (CARDIA)    Difficulty of Paying Living Expenses: Not hard at all  Food Insecurity: No Food Insecurity (01/12/2024)   Hunger Vital Sign    Worried About Running Out of Food  in the Last Year: Never true    Ran Out of Food in the Last Year: Never true  Transportation Needs: No Transportation Needs (01/12/2024)   PRAPARE - Administrator, Civil Service (Medical): No    Lack of Transportation (Non-Medical): No  Physical Activity: Inactive (01/12/2024)   Exercise Vital Sign    Days of Exercise per Week: 0 days    Minutes of Exercise per Session: 0 min  Stress: No Stress Concern Present (01/12/2024)   [employer] of Occupational Health - Occupational Stress Questionnaire    Feeling of Stress: Only a little  Social Connections: Moderately Isolated (01/12/2024)   Social Connection and Isolation Panel    Frequency of Communication with Friends and Family: More than three times a week  Frequency of Social Gatherings with Friends and Family: More than three times a week    Attends Religious Services: More than 4 times per year    Active Member of Clubs or Organizations: No    Attends Banker Meetings: Never    Marital Status: Widowed  Intimate Partner Violence: Not At Risk (01/12/2024)   Humiliation, Afraid, Rape, and Kick questionnaire    Fear of Current or Ex-Partner: No    Emotionally Abused: No    Physically Abused: No    Sexually Abused: No     Outpatient Medications Prior to Visit  Medication Sig Dispense Refill   Acetaminophen  (TYLENOL  PO) Take by mouth as needed.     amLODipine  (NORVASC ) 2.5 MG tablet Take 1 tablet (2.5 mg total) by mouth at bedtime. 90 tablet 3   azelastine  (ASTELIN ) 0.1 % nasal spray Place 2 sprays into both nostrils 2 (two) times daily. Use in each nostril as directed 30 mL 12   cephALEXin (KEFLEX) 500 MG capsule Take 1 capsule (500 mg total) by mouth 4 (four) times daily. 20 capsule 0   cetirizine (ZYRTEC) 10 MG chewable tablet Chew 10 mg by mouth daily.     cholecalciferol (VITAMIN D3) 25 MCG (1000 UNIT) tablet Take 2,000 Units by mouth daily.     omeprazole  (PRILOSEC) 40 MG capsule Take 1 capsule (40  mg total) by mouth daily. 90 capsule 3   traMADol (ULTRAM) 50 MG tablet Take 1 tablet (50 mg total) by mouth every 6 (six) hours as needed. 15 tablet 0   methocarbamol (ROBAXIN) 500 MG tablet Take 1 tablet (500 mg total) by mouth 2 (two) times daily. (Patient not taking: Reported on 03/31/2024) 20 tablet 0   diclofenac  (VOLTAREN ) 75 MG EC tablet Take 1 tablet (75 mg total) by mouth daily. 60 tablet 0   No facility-administered medications prior to visit.    No Known Allergies  ROS Review of Systems Negative unless indicated in HPI.    Objective:    Physical Exam Constitutional:      Appearance: Normal appearance.  Cardiovascular:     Rate and Rhythm: Normal rate and regular rhythm.     Pulses: Normal pulses.     Heart sounds: Normal heart sounds.  Neurological:     General: No focal deficit present.     Mental Status: She is alert and oriented to person, place, and time.  Psychiatric:        Mood and Affect: Mood normal.        Behavior: Behavior normal.     BP 112/74   Pulse 69   Temp 97.7 F (36.5 C)   Ht 5' 2 (1.575 m)   Wt 154 lb (69.9 kg)   SpO2 97%   BMI 28.17 kg/m  Wt Readings from Last 3 Encounters:  03/31/24 154 lb (69.9 kg)  03/27/24 173 lb 15.1 oz (78.9 kg)  01/12/24 174 lb (78.9 kg)     Health Maintenance  Topic Date Due   DTaP/Tdap/Td (4 - Td or Tdap) 01/12/2024   COVID-19 Vaccine (4 - 2025-26 season) 01/25/2024   Influenza Vaccine  08/23/2024 (Originally 12/25/2023)   Mammogram  11/16/2024   Medicare Annual Wellness (AWV)  01/11/2025   Colonoscopy  06/03/2026   Pneumococcal Vaccine: 50+ Years  Completed   DEXA SCAN  Completed   Hepatitis C Screening  Completed   Meningococcal B Vaccine  Aged Out   Zoster Vaccines- Shingrix  Discontinued    There are  no preventive care reminders to display for this patient.  Lab Results  Component Value Date   TSH 1.19 10/06/2023   Lab Results  Component Value Date   WBC 5.4 03/27/2024   HGB 10.2 (L)  03/27/2024   HCT 30.0 (L) 03/27/2024   MCV 92.7 03/27/2024   PLT 274 03/27/2024   Lab Results  Component Value Date   NA 137 03/27/2024   K 4.6 03/27/2024   CO2 24 03/27/2024   GLUCOSE 84 03/27/2024   BUN 28 (H) 03/27/2024   CREATININE 1.30 (H) 03/27/2024   BILITOT 0.7 03/27/2024   ALKPHOS 94 03/27/2024   AST 22 03/27/2024   ALT 18 03/27/2024   PROT 7.3 03/27/2024   ALBUMIN 3.7 03/27/2024   CALCIUM  9.7 03/27/2024   ANIONGAP 12 03/27/2024   GFR 55.51 (L) 10/06/2023   Lab Results  Component Value Date   CHOL 187 10/06/2023   Lab Results  Component Value Date   HDL 59.50 10/06/2023   Lab Results  Component Value Date   LDLCALC 113 (H) 10/06/2023   Lab Results  Component Value Date   TRIG 74.0 10/06/2023   Lab Results  Component Value Date   CHOLHDL 3 10/06/2023   Lab Results  Component Value Date   HGBA1C 5.8 10/06/2023      Assessment & Plan:   Assessment & Plan Osteoarthritis, unspecified osteoarthritis type, unspecified site  Orders:   diclofenac  (VOLTAREN ) 75 MG EC tablet; Take 1 tablet (75 mg total) by mouth daily.  Compression fracture of lumbar vertebra, sequela Pain due to lumbar compression fracture, severe and persistent, relieved by sitting and medication.  - Continue tramadol for pain management. - Follow up with orthopedic specialist on November 12th. - Consider obtaining a back brace from the orthopedic clinic.     Vaginal prolapse Pelvic organ prolapse with urinary incontinence. Previous pessary placement unsuccessful.  Referral to urogynecology in place. - Provided contact information for urogynecology to schedule appointment.     Constipation, unspecified constipation type No bowel movement for a week, with nausea and abdominal pain. Possible factor in nausea and weight loss. - Start stool softener or Miralax. - Ensure adequate fluid and fiber intake. - Consider abdominal x-ray if no bowel movement in next week.      Nausea Nausea with poor intake and significant weight loss. Weight loss from 174 lbs to 154 lbs since August. - Prescribed medication for nausea. - Encouraged small, frequent meals and trying different foods. - Monitor weight and appetite at follow-up visit.   Orders:   ondansetron  (ZOFRAN ) 4 MG tablet; Take 1 tablet (4 mg total) by mouth every 8 (eight) hours as needed for nausea or vomiting.  Acute cystitis without hematuria Currently treated with Keflexin. - Continue Keflexin for UTI treatment.       Assessment & Plan    Follow-up: Return in about 1 week (around 04/07/2024).   Emonii Wienke, NP

## 2024-03-31 NOTE — Patient Instructions (Signed)
 [facility], [facility]  [address]  [place], [place] [address]  Phone: [phone]  fax:[phone]

## 2024-03-31 NOTE — Assessment & Plan Note (Addendum)
  Orders:   diclofenac  (VOLTAREN ) 75 MG EC tablet; Take 1 tablet (75 mg total) by mouth daily.

## 2024-04-06 ENCOUNTER — Other Ambulatory Visit: Payer: Self-pay | Admitting: Student

## 2024-04-06 ENCOUNTER — Encounter: Payer: Self-pay | Admitting: Nurse Practitioner

## 2024-04-06 DIAGNOSIS — M5416 Radiculopathy, lumbar region: Secondary | ICD-10-CM

## 2024-04-06 DIAGNOSIS — M4807 Spinal stenosis, lumbosacral region: Secondary | ICD-10-CM

## 2024-04-06 DIAGNOSIS — M47816 Spondylosis without myelopathy or radiculopathy, lumbar region: Secondary | ICD-10-CM

## 2024-04-06 DIAGNOSIS — N3000 Acute cystitis without hematuria: Secondary | ICD-10-CM | POA: Insufficient documentation

## 2024-04-06 DIAGNOSIS — M48062 Spinal stenosis, lumbar region with neurogenic claudication: Secondary | ICD-10-CM

## 2024-04-06 DIAGNOSIS — S32000S Wedge compression fracture of unspecified lumbar vertebra, sequela: Secondary | ICD-10-CM | POA: Insufficient documentation

## 2024-04-06 DIAGNOSIS — K5900 Constipation, unspecified: Secondary | ICD-10-CM | POA: Insufficient documentation

## 2024-04-06 DIAGNOSIS — R110 Nausea: Secondary | ICD-10-CM | POA: Insufficient documentation

## 2024-04-06 HISTORY — DX: Acute cystitis without hematuria: N30.00

## 2024-04-06 HISTORY — DX: Constipation, unspecified: K59.00

## 2024-04-06 NOTE — Assessment & Plan Note (Addendum)
 No bowel movement for a week, with nausea and abdominal pain. Possible factor in nausea and weight loss. - Start stool softener or Miralax. - Ensure adequate fluid and fiber intake. - Consider abdominal x-ray if no bowel movement in next week.

## 2024-04-06 NOTE — Assessment & Plan Note (Addendum)
 Pelvic organ prolapse with urinary incontinence. Previous pessary placement unsuccessful.  Referral to urogynecology in place. - Provided contact information for urogynecology to schedule appointment.

## 2024-04-06 NOTE — Assessment & Plan Note (Addendum)
 Nausea with poor intake and significant weight loss. Weight loss from 174 lbs to 154 lbs since August. - Prescribed medication for nausea. - Encouraged small, frequent meals and trying different foods. - Monitor weight and appetite at follow-up visit.   Orders:   ondansetron  (ZOFRAN ) 4 MG tablet; Take 1 tablet (4 mg total) by mouth every 8 (eight) hours as needed for nausea or vomiting.

## 2024-04-06 NOTE — Assessment & Plan Note (Addendum)
 Currently treated with Keflexin. - Continue Keflexin for UTI treatment.

## 2024-04-06 NOTE — Assessment & Plan Note (Addendum)
 Pain due to lumbar compression fracture, severe and persistent, relieved by sitting and medication.  - Continue tramadol for pain management. - Follow up with orthopedic specialist on November 12th. - Consider obtaining a back brace from the orthopedic clinic.

## 2024-04-07 ENCOUNTER — Ambulatory Visit: Admitting: Nurse Practitioner

## 2024-04-07 ENCOUNTER — Ambulatory Visit
Admission: RE | Admit: 2024-04-07 | Discharge: 2024-04-07 | Disposition: A | Discharge status: Home / Self Care | Source: Ambulatory Visit | Attending: Student | Admitting: Student

## 2024-04-07 ENCOUNTER — Encounter: Payer: Self-pay | Admitting: Nurse Practitioner

## 2024-04-07 VITALS — BP 122/78 | HR 69 | Temp 97.5°F | Ht 62.0 in | Wt 153.6 lb

## 2024-04-07 DIAGNOSIS — N3000 Acute cystitis without hematuria: Secondary | ICD-10-CM

## 2024-04-07 DIAGNOSIS — M47816 Spondylosis without myelopathy or radiculopathy, lumbar region: Secondary | ICD-10-CM | POA: Insufficient documentation

## 2024-04-07 DIAGNOSIS — S32000S Wedge compression fracture of unspecified lumbar vertebra, sequela: Secondary | ICD-10-CM | POA: Diagnosis not present

## 2024-04-07 DIAGNOSIS — M48062 Spinal stenosis, lumbar region with neurogenic claudication: Secondary | ICD-10-CM | POA: Insufficient documentation

## 2024-04-07 DIAGNOSIS — M4807 Spinal stenosis, lumbosacral region: Secondary | ICD-10-CM | POA: Diagnosis present

## 2024-04-07 DIAGNOSIS — Z87440 Personal history of urinary (tract) infections: Secondary | ICD-10-CM

## 2024-04-07 DIAGNOSIS — M5416 Radiculopathy, lumbar region: Secondary | ICD-10-CM | POA: Diagnosis present

## 2024-04-07 DIAGNOSIS — G479 Sleep disorder, unspecified: Secondary | ICD-10-CM

## 2024-04-08 LAB — URINE CULTURE
MICRO NUMBER:: 17231308
SPECIMEN QUALITY:: ADEQUATE

## 2024-04-09 ENCOUNTER — Ambulatory Visit: Payer: Self-pay | Admitting: Nurse Practitioner

## 2024-04-09 NOTE — Progress Notes (Signed)
 Please inform patient urine culture negative for UTI.

## 2024-04-13 ENCOUNTER — Encounter: Payer: Self-pay | Admitting: Nurse Practitioner

## 2024-04-13 DIAGNOSIS — Z87440 Personal history of urinary (tract) infections: Secondary | ICD-10-CM | POA: Insufficient documentation

## 2024-04-13 NOTE — Assessment & Plan Note (Signed)
 Patient had UTI on 03/27/2024 has completed antibiotic course and would like to recheck the urine.  Denies any frequency or urgency. Orders:   Urine Culture   Urinalysis, Routine w reflex microscopic

## 2024-04-13 NOTE — Assessment & Plan Note (Signed)
 Back pain due to lumbar vertebral fractures. Gabapentin  improved pain and sleep.  She has seen orthopedic on 04/06/2024 and has MRI scheduled to assess fractures and determine further management. - Continue gabapentin  as prescribed by orthopedics. -Followed by orthopedics

## 2024-04-13 NOTE — Progress Notes (Signed)
 Established Patient Office Visit  Subjective:  Patient ID: Madison Craig, female    DOB: Jan 14, 1946  Age: 78 y.o. MRN: [id number]  CC:  Chief Complaint  Patient presents with   Follow-up   Discussed the use of AI scribe software for clinical note transcription with the patient, who gave verbal consent to proceed.  History of Present Illness   Madison Craig is a 78 year old female who presents for follow-up on constipation and back pain.  Constipation has improved with a stool softener and increased food intake. She last bowel movement was normal and occurred yesterday evening. Previously experienced nausea has resolved, and she is now eating better with regular bowel movements.  She has an MRI of her back scheduled for today due to two fractured vertebrae. Gabapentin, prescribed by an orthopedic specialist, has significantly helped with her pain and sleep. Her pain, described as a burning sensation in her legs, has improved with the medication, allowing her to sleep through the night.   Patient completed the course of antibiotic for UTI and would like to get a urine culture done.  Denies any frequency and urgency      Past Medical History:  Diagnosis Date   Anemia    Arthritis    Basal cell carcinoma    Benign neoplasm of ascending colon    Dyspnea 09/28/2013   Pulmonary function testing done on 02/09/2014 showed minimal airway obstruction with a lack of response to bronchodilators; her FEV1 was normal, the FEV1/FVC ratio and the FEF 25-75% were reduced; the airway resistance was normal; lung volumes were within normal limits; the diffusing capacity was high for the measured volumes.     GERD (gastroesophageal reflux disease)    Heart murmur    Heme + stool    Macular degeneration 01/11/2014   Followed at [facility] in [place], [place].    Multiple thyroid  nodules    Thyroid  ultrasound on 01/25/2014 showed a multinodular goiter, with a 1.5 cm nodule in the  inferior left thyroid  lobe that met the criteria for ultrasound-guided biopsy.  On 02/06/2014 an ultrasound guided needle aspirate biopsy was performed of the left inferior thyroid  nodule; cytopathology findings were consistent with non-neoplastic goiter.      Osteoporosis 09/18/2014   DXA 08/16/2014 showed osteopenia of right femur neck (T-score -2.3) and osteoporosis of left forearm radius (T-score -2.9).  Lumbar spine was not utilized due to advanced degenerative changes.    Reflux esophagitis    SLEEP APNEA 03/22/2009   Had sleep study in [place] about 10 years, has BiPAP, cannot sleep with it on.      Past Surgical History:  Procedure Laterality Date   BREAST SURGERY  1988   breast biopsy   CARDIAC CATHETERIZATION  2006   No Stents/[facility]   COLONOSCOPY WITH PROPOFOL  N/A 04/24/2017   Procedure: COLONOSCOPY WITH PROPOFOL ;  Surgeon: Janalyn Keene NOVAK, MD;  Location: [facility] ENDOSCOPY;  Service: Endoscopy;  Laterality: N/A;   COLONOSCOPY WITH PROPOFOL  N/A 06/04/2023   Procedure: COLONOSCOPY WITH PROPOFOL ;  Surgeon: Unk Corinn Skiff, MD;  Location[facility];  Service: Gastroenterology;  Laterality: N/A;   ENDOVENOUS ABLATION SAPHENOUS VEIN W/ LASER Right 04/17/2021   endovenous laser ablation right greater saphenous vein and stab phlebectomy > 20 incisions right leg by Medford Blade MD   ESOPHAGOGASTRODUODENOSCOPY (EGD) WITH PROPOFOL  N/A 04/24/2017   Procedure: ESOPHAGOGASTRODUODENOSCOPY (EGD) WITH PROPOFOL ;  Surgeon: Janalyn Keene NOVAK, MD;  Location: [facility] ENDOSCOPY;  Service: Endoscopy;  Laterality: N/A;  eye     cateract surgery   INCISION AND DRAINAGE OF WOUND Left 05/05/2023   Procedure: IRRIGATION AND DEBRIDEMENT WOUND LEFT LONG FINGER;  Surgeon: Edie Norleen PARAS, MD;  Location: [facility] ORS;  Service: Orthopedics;  Laterality: Left;    Family History  Problem Relation Age of Onset   Stroke Mother    Hypertension Mother    Dementia Mother    Heart attack Mother    Atrial  fibrillation Mother    Heart disease Father 50   Sudden death Father    Hypertension Father    Hyperlipidemia Father    Heart attack Father    Stroke Sister 37   Alcohol abuse Sister    Valvular heart disease Sister    Atrial fibrillation Sister    Aneurysm Sister    Arrhythmia Brother    Atrial fibrillation Brother    Diabetes Son 88   Hodgkin's lymphoma Son    Colon cancer Neg Hx    Breast cancer Neg Hx     Social History   Socioeconomic History   Marital status: Widowed    Spouse name: Not on file   Number of children: 3   Years of education: Not on file   Highest education level: Not on file  Occupational History   Not on file  Tobacco Use   Smoking status: Former    Current packs/day: 0.00    Average packs/day: 2.0 packs/day for 16.0 years (32.0 ttl pk-yrs)    Types: Cigarettes    Start date: 09/28/1965    Quit date: 09/28/1981    Years since quitting: 42.5   Smokeless tobacco: Never  Vaping Use   Vaping status: Never Used  Substance and Sexual Activity   Alcohol use: No   Drug use: No   Sexual activity: Not Currently    Birth control/protection: Post-menopausal  Other Topics Concern   Not on file  Social History Narrative   Widow   Social Drivers of Health   Financial Resource Strain: Medium Risk (04/06/2024)   Received from [facility]   Overall Financial Resource Strain (CARDIA)    Difficulty of Paying Living Expenses: Somewhat hard  Food Insecurity: No Food Insecurity (04/06/2024)   Received from [facility]   Hunger Vital Sign    Within the past 12 months, you worried that your food would run out before you got the money to buy more.: Never true    Within the past 12 months, the food you bought just didn't last and you didn't have money to get more.: Never true  Transportation Needs: No Transportation Needs (04/06/2024)   Received from [facility] - Transportation    In the past 12  months, has lack of transportation kept you from medical appointments or from getting medications?: No    Lack of Transportation (Non-Medical): No  Physical Activity: Inactive (01/12/2024)   Exercise Vital Sign    Days of Exercise per Week: 0 days    Minutes of Exercise per Session: 0 min  Stress: No Stress Concern Present (01/12/2024)   [employer] - Occupational Stress Questionnaire    Feeling of Stress: Only a little  Social Connections: Moderately Isolated (01/12/2024)   Social Connection and Isolation Panel    Frequency of Communication with Friends and Family: More than three times a week    Frequency of Social Gatherings with Friends and Family: More than three times a week  Attends Religious Services: More than 4 times per year    Active Member of Clubs or Organizations: No    Attends Banker Meetings: Never    Marital Status: Widowed  Intimate Partner Violence: Not At Risk (01/12/2024)   Humiliation, Afraid, Rape, and Kick questionnaire    Fear of Current or Ex-Partner: No    Emotionally Abused: No    Physically Abused: No    Sexually Abused: No     Outpatient Medications Prior to Visit  Medication Sig Dispense Refill   Acetaminophen  (TYLENOL  PO) Take by mouth as needed.     amLODipine  (NORVASC ) 2.5 MG tablet Take 1 tablet (2.5 mg total) by mouth at bedtime. 90 tablet 3   azelastine  (ASTELIN ) 0.1 % nasal spray Place 2 sprays into both nostrils 2 (two) times daily. Use in each nostril as directed 30 mL 12   cetirizine (ZYRTEC) 10 MG chewable tablet Chew 10 mg by mouth daily.     cholecalciferol (VITAMIN D3) 25 MCG (1000 UNIT) tablet Take 2,000 Units by mouth daily.     diclofenac  (VOLTAREN ) 75 MG EC tablet Take 1 tablet (75 mg total) by mouth daily. 60 tablet 0   gabapentin (NEURONTIN) 100 MG capsule Take 100 mg by mouth 2 (two) times daily.     omeprazole  (PRILOSEC) 40 MG capsule Take 1 capsule (40 mg total) by mouth daily. 90  capsule 3   ondansetron  (ZOFRAN ) 4 MG tablet Take 1 tablet (4 mg total) by mouth every 8 (eight) hours as needed for nausea or vomiting. 20 tablet 0   traMADol (ULTRAM) 50 MG tablet Take 1 tablet (50 mg total) by mouth every 6 (six) hours as needed. 15 tablet 0   cephALEXin (KEFLEX) 500 MG capsule Take 1 capsule (500 mg total) by mouth 4 (four) times daily. (Patient not taking: Reported on 04/07/2024) 20 capsule 0   methocarbamol (ROBAXIN) 500 MG tablet Take 1 tablet (500 mg total) by mouth 2 (two) times daily. (Patient not taking: Reported on 04/07/2024) 20 tablet 0   No facility-administered medications prior to visit.    No Known Allergies  ROS Review of Systems Negative unless indicated in HPI.    Objective:    Physical Exam Constitutional:      Appearance: Normal appearance.  HENT:     Mouth/Throat:     Mouth: Mucous membranes are moist.  Eyes:     Conjunctiva/sclera: Conjunctivae normal.     Pupils: Pupils are equal, round, and reactive to light.  Cardiovascular:     Rate and Rhythm: Normal rate and regular rhythm.     Pulses: Normal pulses.     Heart sounds: Normal heart sounds.  Pulmonary:     Effort: Pulmonary effort is normal.     Breath sounds: Normal breath sounds.  Musculoskeletal:     Cervical back: Normal range of motion. No tenderness.  Neurological:     General: No focal deficit present.     Mental Status: She is alert and oriented to person, place, and time. Mental status is at baseline.  Psychiatric:        Mood and Affect: Mood normal.        Behavior: Behavior normal.        Thought Content: Thought content normal.        Judgment: Judgment normal.     BP 122/78   Pulse 69   Temp (!) 97.5 F (36.4 C)   Ht 5' 2 (1.575 m)   Wt  153 lb 9.6 oz (69.7 kg)   SpO2 98%   BMI 28.09 kg/m  Wt Readings from Last 3 Encounters:  04/07/24 153 lb 9.6 oz (69.7 kg)  03/31/24 154 lb (69.9 kg)  03/27/24 173 lb 15.1 oz (78.9 kg)     Health Maintenance   Topic Date Due   DTaP/Tdap/Td (4 - Td or Tdap) 01/12/2024   COVID-19 Vaccine (4 - 2025-26 season) 01/25/2024   Influenza Vaccine  08/23/2024 (Originally 12/25/2023)   Mammogram  11/16/2024   Medicare Annual Wellness (AWV)  01/11/2025   Colonoscopy  06/03/2026   Pneumococcal Vaccine: 50+ Years  Completed   Bone Density Scan  Completed   Hepatitis C Screening  Completed   Meningococcal B Vaccine  Aged Out   Zoster Vaccines- Shingrix  Discontinued    There are no preventive care reminders to display for this patient.  Lab Results  Component Value Date   TSH 1.19 10/06/2023   Lab Results  Component Value Date   WBC 5.4 03/27/2024   HGB 10.2 (L) 03/27/2024   HCT 30.0 (L) 03/27/2024   MCV 92.7 03/27/2024   PLT 274 03/27/2024   Lab Results  Component Value Date   NA 137 03/27/2024   K 4.6 03/27/2024   CO2 24 03/27/2024   GLUCOSE 84 03/27/2024   BUN 28 (H) 03/27/2024   CREATININE 1.30 (H) 03/27/2024   BILITOT 0.7 03/27/2024   ALKPHOS 94 03/27/2024   AST 22 03/27/2024   ALT 18 03/27/2024   PROT 7.3 03/27/2024   ALBUMIN 3.7 03/27/2024   CALCIUM  9.7 03/27/2024   ANIONGAP 12 03/27/2024   GFR 55.51 (L) 10/06/2023   Lab Results  Component Value Date   CHOL 187 10/06/2023   Lab Results  Component Value Date   HDL 59.50 10/06/2023   Lab Results  Component Value Date   LDLCALC 113 (H) 10/06/2023   Lab Results  Component Value Date   TRIG 74.0 10/06/2023   Lab Results  Component Value Date   CHOLHDL 3 10/06/2023   Lab Results  Component Value Date   HGBA1C 5.8 10/06/2023      Assessment & Plan:   Assessment & Plan H/O cystitis Patient had UTI on 03/27/2024 has completed antibiotic course and would like to recheck the urine.  Denies any frequency or urgency. Orders:   Urine Culture   Urinalysis, Routine w reflex microscopic  Compression fracture of lumbar vertebra, sequela Back pain due to lumbar vertebral fractures. Gabapentin improved pain and sleep.   She has seen orthopedic on 04/06/2024 and has MRI scheduled to assess fractures and determine further management. - Continue gabapentin as prescribed by orthopedics. -Followed by orthopedics     Sleep disturbance, improved Sleep disturbance improved with gabapentin, allowing for longer sleep duration and reduced nocturnal awakenings. - Continue gabapentin as it aids in sleep improvement.      Follow-up: Return for [facility] Dr.Bair.   Jahkai Yandell, NP

## 2024-04-18 ENCOUNTER — Ambulatory Visit: Payer: Self-pay

## 2024-04-18 ENCOUNTER — Ambulatory Visit

## 2024-04-18 VITALS — BP 120/74 | HR 68 | Temp 97.9°F | Ht 62.5 in | Wt 154.6 lb

## 2024-04-18 DIAGNOSIS — R7303 Prediabetes: Secondary | ICD-10-CM | POA: Diagnosis not present

## 2024-04-18 DIAGNOSIS — S32000S Wedge compression fracture of unspecified lumbar vertebra, sequela: Secondary | ICD-10-CM

## 2024-04-18 DIAGNOSIS — Z23 Encounter for immunization: Secondary | ICD-10-CM

## 2024-04-18 DIAGNOSIS — J309 Allergic rhinitis, unspecified: Secondary | ICD-10-CM

## 2024-04-18 DIAGNOSIS — I10 Essential (primary) hypertension: Secondary | ICD-10-CM | POA: Diagnosis not present

## 2024-04-18 DIAGNOSIS — K219 Gastro-esophageal reflux disease without esophagitis: Secondary | ICD-10-CM | POA: Diagnosis not present

## 2024-04-18 DIAGNOSIS — E042 Nontoxic multinodular goiter: Secondary | ICD-10-CM

## 2024-04-18 DIAGNOSIS — M8008XA Age-related osteoporosis with current pathological fracture, vertebra(e), initial encounter for fracture: Secondary | ICD-10-CM | POA: Diagnosis not present

## 2024-04-18 DIAGNOSIS — E7800 Pure hypercholesterolemia, unspecified: Secondary | ICD-10-CM

## 2024-04-18 DIAGNOSIS — M150 Primary generalized (osteo)arthritis: Secondary | ICD-10-CM

## 2024-04-18 DIAGNOSIS — N1831 Chronic kidney disease, stage 3a: Secondary | ICD-10-CM

## 2024-04-18 DIAGNOSIS — R7309 Other abnormal glucose: Secondary | ICD-10-CM

## 2024-04-18 DIAGNOSIS — G4730 Sleep apnea, unspecified: Secondary | ICD-10-CM

## 2024-04-18 DIAGNOSIS — N819 Female genital prolapse, unspecified: Secondary | ICD-10-CM

## 2024-04-18 DIAGNOSIS — D500 Iron deficiency anemia secondary to blood loss (chronic): Secondary | ICD-10-CM

## 2024-04-18 DIAGNOSIS — I8393 Asymptomatic varicose veins of bilateral lower extremities: Secondary | ICD-10-CM

## 2024-04-18 DIAGNOSIS — R011 Cardiac murmur, unspecified: Secondary | ICD-10-CM

## 2024-04-18 LAB — COMPREHENSIVE METABOLIC PANEL WITH GFR
ALT: 12 U/L (ref 0–35)
AST: 17 U/L (ref 0–37)
Albumin: 4.2 g/dL (ref 3.5–5.2)
Alkaline Phosphatase: 103 U/L (ref 39–117)
BUN: 19 mg/dL (ref 6–23)
CO2: 30 meq/L (ref 19–32)
Calcium: 9.4 mg/dL (ref 8.4–10.5)
Chloride: 101 meq/L (ref 96–112)
Creatinine, Ser: 1.12 mg/dL (ref 0.40–1.20)
GFR: 47.69 mL/min — ABNORMAL LOW (ref >60.00)
Glucose, Bld: 87 mg/dL (ref 70–99)
Potassium: 4.3 meq/L (ref 3.5–5.1)
Sodium: 137 meq/L (ref 135–145)
Total Bilirubin: 0.6 mg/dL (ref 0.2–1.2)
Total Protein: 6.7 g/dL (ref 6.0–8.3)

## 2024-04-18 LAB — TSH: TSH: 0.64 u[IU]/mL (ref 0.35–5.50)

## 2024-04-18 LAB — HEMOGLOBIN A1C: Hgb A1c MFr Bld: 5.4 % (ref 4.6–6.5)

## 2024-04-18 MED ORDER — OMEPRAZOLE 40 MG PO CPDR: 40.0000 mg | DELAYED_RELEASE_CAPSULE | Freq: Every day | ORAL | 3 refills | Status: AC

## 2024-04-18 MED ORDER — DICLOFENAC SODIUM 75 MG PO TBEC: 75.0000 mg | DELAYED_RELEASE_TABLET | Freq: Every day | ORAL | 1 refills | Status: AC

## 2024-04-18 MED ORDER — AMLODIPINE BESYLATE 2.5 MG PO TABS: 2.5000 mg | ORAL_TABLET | Freq: Every evening | ORAL | 3 refills | Status: AC

## 2024-04-18 MED ORDER — GABAPENTIN 100 MG PO CAPS: 100.0000 mg | ORAL_CAPSULE | Freq: Two times a day (BID) | ORAL | 1 refills | Status: AC

## 2024-04-18 MED ORDER — AZELASTINE HCL 0.1 % NA SOLN: 2.0000 | Freq: Two times a day (BID) | NASAL | 3 refills | Status: AC

## 2024-04-18 NOTE — Assessment & Plan Note (Addendum)
 Stable on Azelastine  0.1% nasal spray BID, continue.  Orders:   azelastine  (ASTELIN ) 0.1 % nasal spray; Place 2 sprays into both nostrils 2 (two) times daily. Use in each nostril as directed

## 2024-04-18 NOTE — Assessment & Plan Note (Addendum)
 BP within goal, no dizziness, lower leg edema, palpitations. Continue Amlodipine  2.5 mg daily. Refill sent.  Check CMP given previous lab from 03/27/24 which showed reduction in kidney function from baseline (GFR 34, Cr 1.58. Baseline Cr 0.99, GFR 49-55).  Anticipate improvement in renal function as patient was not eating/drinking during lab on 03/27/24.  Orders:   amLODipine  (NORVASC ) 2.5 MG tablet; Take 1 tablet (2.5 mg total) by mouth at bedtime.   Comprehensive metabolic panel with GFR

## 2024-04-18 NOTE — Assessment & Plan Note (Addendum)
 Previous treated with pessary. No current pessary use due to UTI and discomfort. Treated for E. Coli UTI in 03/27/24. No UTI like symptoms today.   Attend urogynecologist appointment on December 30th.

## 2024-04-18 NOTE — Assessment & Plan Note (Addendum)
 Check A1c  Orders:    Hemoglobin A1c

## 2024-04-18 NOTE — Progress Notes (Signed)
 Established Patient Office Visit   Subjective  Patient ID: Madison Craig, female    DOB: 1945/11/21  Age: 78 y.o. MRN: [id number]  Chief Complaint  Patient presents with   Thyroid  Nodule   Establish Care    Discussed the use of AI scribe software for clinical note transcription with the patient, who gave verbal consent to proceed.  History of Present Illness Madison Craig is a 78 year old female who presents to establish care with new PCP and for medication review.  - Pelvic organ prolapse: 02/22/24 visit with urologist Dr. Lovetta at [facility] for pelvic organ prolapse, managed by pessary in the past. She is not able to tolerate pessary for recurrent UTI. She has an upcoming appointment with a urogynecologist on December 30th. No current urinary symptoms, and her recent E. coli UTI was successfully treated with antibiotics.  - Polyarthritis, compression fracture of lumbar spine, osteoporosis, tinglings of lower legs (reater trochanteric bursitis of right hip, lumbar spondylosis, lumbar radiculopathy, IT band syndrome right), MRI 04/07/2024:  chronic L3 compression fracture, acute to chronic compression fractures of L1,L2,L5 with up to 45% height loss, spinal stenosis at L4-L5, spinal stenosis L3-L4 & moderate to severe spinal stenosis at L2-L3. She experiences pain involving hips, knees, back.  Managed by [facility] orthopedic department for this and sees Dr. Edie and PA Gustavo. Recently she was referred to pain clinic in [place].  She takes Tylenol  1000 mg daily prn, Gabapentin  100 mg BID (helps some but does not resolve burning sensations on both feet), Diclofenac  XR 75 mg daily.  Does not take Tramadol , muscle relaxant.  02/15/24 steroid injection to the right trochanteric bursa. Lives independently at home but has help from her daughter and grand daughter who lives close by.  Has a walker at home, does not use it regularly.  No stool, urine incontinence.     - HTN  On Amlodipine  2.5 mg daily. Denies palpitations, chest pain, dizziness.   - AKI on CKD:  Baseline Cr 0.99, GFR 49-55. Noted to have changed renal function/reduced during ED visit on 03/27/24. Patient reports she was not drinking/eating during this ED visit. She does not see nephrologist. Does take Diclofenac  and omeprazole  daily. She is prediabetic and has htn with BP within goal.   - GERD: Managed with daily Omeprazole  is used to manage her acid reflux. No worsening of reflux symptoms.  - Allergic rhinitis: Using  Azelastine  nasal spray daily and Zyrtec without side effects. Controlling symptoms well.   - Osteoporosis with a compression fracture of the lumbar spine. 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 She is not currently on treatment for osteoporosis.  -Thyroid  nodules: which were biopsied in the 2015 and found to be benign. Last thyroid  ultrasound was in 11/02/2018. Her last thyroid  function test was normal. She has a family history of Graves' disease, as her sister was recently diagnosed. She denies difficulty swallowing, neck pain.   - She has a history of prediabetes and has had iron  infusions in the past for iron  deficiency anemia which she no longer requires. She lives alone but receives daily assistance from her daughter and daughter-in-law. She uses a cane and has a walker at home for mobility. She has not had a fall in over a year.    ROS As per HPI    Objective:     BP 120/74 (BP Location: Left Arm, Patient Position: Sitting, Cuff Size: Normal)   Pulse 68  Temp 97.9 F (36.6 C) (Oral)   Ht 5' 2.5 (1.588 m)   Wt 154 lb 9.6 oz (70.1 kg)   SpO2 98%   BMI 27.83 kg/m      04/18/2024   10:50 AM 04/07/2024   10:40 AM 03/31/2024   11:24 AM  Depression screen PHQ 2/9  Decreased Interest 0 0 1  Down, Depressed, Hopeless 0 0 0  PHQ - 2 Score 0 0 1  Altered sleeping 0 0 0  Tired, decreased energy 0 0 1  Change in appetite 0  0 0  Feeling bad or failure about yourself  0 0 0  Trouble concentrating 0 0 1  Moving slowly or fidgety/restless 0 0 0  Suicidal thoughts 0 0 0  PHQ-9 Score 0 0 3  Difficult doing work/chores Not difficult at all Not difficult at all Somewhat difficult      04/18/2024   10:50 AM 04/07/2024   10:40 AM 03/31/2024   11:24 AM 10/06/2023   10:21 AM  GAD 7 : Generalized Anxiety Score  Nervous, Anxious, on Edge 0 0 0 0  Control/stop worrying 0 0 0 0  Worry too much - different things 0 0 0 0  Trouble relaxing 0 0 1 0  Restless 0 0 0 0  Easily annoyed or irritable 0 0 0 0  Afraid - awful might happen 0 0 0 0  Total GAD 7 Score 0 0 1 0  Anxiety Difficulty Not difficult at all Not difficult at all Somewhat difficult Not difficult at all      04/18/2024   10:50 AM 04/07/2024   10:40 AM 03/31/2024   11:24 AM  Depression screen PHQ 2/9  Decreased Interest 0 0 1  Down, Depressed, Hopeless 0 0 0  PHQ - 2 Score 0 0 1  Altered sleeping 0 0 0  Tired, decreased energy 0 0 1  Change in appetite 0 0 0  Feeling bad or failure about yourself  0 0 0  Trouble concentrating 0 0 1  Moving slowly or fidgety/restless 0 0 0  Suicidal thoughts 0 0 0  PHQ-9 Score 0 0 3  Difficult doing work/chores Not difficult at all Not difficult at all Somewhat difficult      04/18/2024   10:50 AM 04/07/2024   10:40 AM 03/31/2024   11:24 AM 10/06/2023   10:21 AM  GAD 7 : Generalized Anxiety Score  Nervous, Anxious, on Edge 0 0 0 0  Control/stop worrying 0 0 0 0  Worry too much - different things 0 0 0 0  Trouble relaxing 0 0 1 0  Restless 0 0 0 0  Easily annoyed or irritable 0 0 0 0  Afraid - awful might happen 0 0 0 0  Total GAD 7 Score 0 0 1 0  Anxiety Difficulty Not difficult at all Not difficult at all Somewhat difficult Not difficult at all   SDOH Screenings   Food Insecurity: No Food Insecurity (04/06/2024)   Received from [facility]  Housing: Unknown (04/06/2024)   Received  from [facility]  Transportation Needs: No Transportation Needs (04/06/2024)   Received from [facility]  Utilities: Not At Risk (04/06/2024)   Received from [facility]  Alcohol Screen: Low Risk  (01/12/2024)  Depression (PHQ2-9): Low Risk  (04/18/2024)  Financial Resource Strain: Medium Risk (04/06/2024)   Received from [facility]  Physical Activity: Inactive (01/12/2024)  Social Connections: Moderately  Isolated (01/12/2024)  Stress: No Stress Concern Present (01/12/2024)  Tobacco Use: Medium Risk (04/18/2024)  Health Literacy: Adequate Health Literacy (01/12/2024)     Physical Exam Constitutional:      General: She is not in acute distress. HENT:     Head: Normocephalic and atraumatic.     Right Ear: Tympanic membrane normal. There is no impacted cerumen.     Left Ear: Tympanic membrane normal. There is no impacted cerumen.  Cardiovascular:     Rate and Rhythm: Normal rate.     Heart sounds: Murmur heard.  Abdominal:     Palpations: Abdomen is soft.     Tenderness: There is no abdominal tenderness. There is no guarding.  Musculoskeletal:     Cervical back: Neck supple. No rigidity or tenderness.     Right lower leg: No edema.     Left lower leg: No edema.     Comments: B/L varicose veins noted, left worse than right. No edema, erythema.   Lymphadenopathy:     Cervical: No cervical adenopathy.  Neurological:     Mental Status: She is alert and oriented to person, place, and time.     Gait: Gait abnormal (slow, stooped gait.).  Psychiatric:        Mood and Affect: Mood normal.        No results found for any visits on 04/18/24.  The 10-year ASCVD risk score (Arnett DK, et al., 2019) is: 20.8%     Assessment & Plan:   Assessment & Plan Osteoporosis with pathological fracture of lumbar vertebra [facility]) Patient with chronic L3 compression fracture, acute to chronic compression fractures of  L1,L2,L5 with up to 45% height loss, noted on lumbar MRI on 04/07/24.  Referral to [facility] endocr[facility]ology made today for further management of osteoporosis.  Briefly discussed treatment options for osteoporosis. She has a h/o GERD, hiatal hernia currently on treatment with Omeprazole  daily. Likely will benefit from either Denosumab or IV bisphosphonate vs oral.   Continue Acetaminophen  1000 mg daily prn, Gabapentin  100 mg BID and Diclofenac  75 mg daily to help with pain.  Orders:   Ambulatory referral to Endocrinology   diclofenac  (VOLTAREN ) 75 MG EC tablet; Take 1 tablet (75 mg total) by mouth daily.  Essential hypertension BP within goal, no dizziness, lower leg edema, palpitations. Continue Amlodipine  2.5 mg daily. Refill sent.  Check CMP given previous lab from 03/27/24 which showed reduction in kidney function from baseline (GFR 34, Cr 1.58. Baseline Cr 0.99, GFR 49-55).  Anticipate improvement in renal function as patient was not eating/drinking during lab on 03/27/24.  Orders:   amLODipine  (NORVASC ) 2.5 MG tablet; Take 1 tablet (2.5 mg total) by mouth at bedtime.   Comprehensive metabolic panel with GFR  Gastroesophageal reflux disease, unspecified whether esophagitis present Stable on Omeprazole  40 mg daily, continue. Brief medication s/e discussed with the patient.  Orders:   omeprazole  (PRILOSEC) 40 MG capsule; Take 1 capsule (40 mg total) by mouth daily.  Allergic rhinitis, unspecified seasonality, unspecified trigger Stable on Azelastine  0.1% nasal spray BID, continue.  Orders:   azelastine  (ASTELIN ) 0.1 % nasal spray; Place 2 sprays into both nostrils 2 (two) times daily. Use in each nostril as directed  Prediabetes Check A1c  Orders:   Hemoglobin A1c  Asymptomatic varicose veins of both lower extremities Asymptomatic varicose veins with previous vein removal. Compression socks not tolerated. - Elevate legs and walk regularly to reduce symptoms.    Compression fracture  of lumbar vertebra, sequela Defer  to management from today as listed on osteoporosis and primary arthritis of multiple joints.  Continue vitamin D  2000 units daily.  Orders:   gabapentin  (NEURONTIN ) 100 MG capsule; Take 1 capsule (100 mg total) by mouth 2 (two) times daily.   Ambulatory referral to Endocrinology   diclofenac  (VOLTAREN ) 75 MG EC tablet; Take 1 tablet (75 mg total) by mouth daily.  Primary osteoarthritis involving multiple joints - Greater trochanteric bursitis of right hip, lumbar spondylosis, lumbar radiculopathy, IT band syndrome right, lumbar compression fracture causing pain on rotational movement.  - Patient is very hesitant on stopping intake of Diclofenac  75 mg daily as this has helped with pain. Discussed in detail increased risk of worsening CKD, GERD symptoms. Patient is aware and will continue to take Diclofenac  75 mg daily with food.  - Also notices tingling of b/l feet. Continue Gabapentin  100 mg BID, refill sent.  - Continue follow up with [facility] orthopedic department (Dr. Edie and PA Gustavo.  - Has appointment with PMR at [facility].  - Encourage PT/use of a walker to reduce risk of fall. Patient encouraged to reach out to us  for potential home health for PT if worsening arthritis.   Orders:   diclofenac  (VOLTAREN ) 75 MG EC tablet; Take 1 tablet (75 mg total) by mouth daily.  Multiple thyroid  nodules Patient with known h/o multiple thyroid  nodules.  Last thyroid  ultrasound 11/02/2018:  Borderline thyromegaly with multiple small nodules. None meets criteria for biopsy or dedicated imaging follow-up.  Patient does not have thyromegaly, hyper,hypothyroid symptoms today.  TSH has been normal. Recommend TSH repeat today.  Consider repeat tyroid ultrasound, referral to ENT in the future.  Orders:   TSH   Ambulatory referral to Endocrinology  Stage 3a chronic kidney disease (HCC) Baseline Cr 0.99, GFR 49-55. Suspect multifactorial (hypertension, use of NSAID,  pelvic organ prolapse).  Check CMP.  Patient hesitant on discontinuing Diclofenac  75 mg daily which has helped with arthritic pain.  Will continue to monitor renal function intermittently.     Female genital prolapse, unspecified type Previous treated with pessary. No current pessary use due to UTI and discomfort. Treated for E. Coli UTI in 03/27/24. No UTI like symptoms today.   Attend urogynecologist appointment on December 30th.     Sleep apnea, unspecified type Not compliant with CPAP, declines referral to sleep clinic.      Murmur Heart murmur with no new symptoms of palpitations, dyspnea. Continue follow up with cardiology.      I personally spent a total of 55 minutes in the care of the patient today including preparing to see the patient, getting/reviewing separately obtained history, performing a medically appropriate exam/evaluation, counseling and educating, placing orders, referring and communicating with other health care professionals, documenting clinical information in the EHR, independently interpreting results, and communicating results.  Return in about 4 months (around 08/16/2024) for Chronic follow up .   Luke Shade, MD

## 2024-04-18 NOTE — Assessment & Plan Note (Deleted)
 Madison Craig

## 2024-04-18 NOTE — Patient Instructions (Addendum)
-   Please follow up with [facility]/pain/orthopedic clinic to see if you will benefit from physical therapy. If they are not able to put a referral to physical therapy please reach out to our clinic.   - Use a walker for ambulation to help reduce risk of further fall.   - I am referring you to [facility] end[facility] for treatment of osteoporosis. If you do not hear from them to schedule an appointment within 2 weeks please reach out to our clinic.   - Follow up with Dr. Abbey in 4 months.

## 2024-04-18 NOTE — Assessment & Plan Note (Addendum)
 Stable on Omeprazole  40 mg daily, continue. Brief medication s/e discussed with the patient.  Orders:   omeprazole  (PRILOSEC) 40 MG capsule; Take 1 capsule (40 mg total) by mouth daily.

## 2024-04-18 NOTE — Assessment & Plan Note (Signed)
 Patient with known h/o multiple thyroid  nodules.  Last thyroid  ultrasound 11/02/2018:  Borderline thyromegaly with multiple small nodules. None meets criteria for biopsy or dedicated imaging follow-up.  Patient does not have thyromegaly, hyper,hypothyroid symptoms today.  TSH has been normal. Recommend TSH repeat today.  Consider repeat tyroid ultrasound, referral to ENT in the future.  Orders:   TSH   Ambulatory referral to Endocrinology

## 2024-04-18 NOTE — Assessment & Plan Note (Addendum)
 Heart murmur with no new symptoms of palpitations, dyspnea. Continue follow up with cardiology.

## 2024-04-18 NOTE — Assessment & Plan Note (Signed)
 Baseline Cr 0.99, GFR 49-55. Suspect multifactorial (hypertension, use of NSAID, pelvic organ prolapse).  Check CMP.  Patient hesitant on discontinuing Diclofenac  75 mg daily which has helped with arthritic pain.  Will continue to monitor renal function intermittently.

## 2024-04-18 NOTE — Assessment & Plan Note (Addendum)
 Asymptomatic varicose veins with previous vein removal. Compression socks not tolerated. - Elevate legs and walk regularly to reduce symptoms.

## 2024-04-18 NOTE — Assessment & Plan Note (Signed)
-   Greater trochanteric bursitis of right hip, lumbar spondylosis, lumbar radiculopathy, IT band syndrome right, lumbar compression fracture causing pain on rotational movement.  - Patient is very hesitant on stopping intake of Diclofenac  75 mg daily as this has helped with pain. Discussed in detail increased risk of worsening CKD, GERD symptoms. Patient is aware and will continue to take Diclofenac  75 mg daily with food.  - Also notices tingling of b/l feet. Continue Gabapentin  100 mg BID, refill sent.  - Continue follow up with [facility] orthopedic department (Dr. Edie and PA Gustavo.  - Has appointment with PMR at [facility].  - Encourage PT/use of a walker to reduce risk of fall. Patient encouraged to reach out to us  for potential home health for PT if worsening arthritis.   Orders:   diclofenac  (VOLTAREN ) 75 MG EC tablet; Take 1 tablet (75 mg total) by mouth daily.

## 2024-04-18 NOTE — Assessment & Plan Note (Addendum)
 Not compliant with CPAP, declines referral to sleep clinic.

## 2024-04-18 NOTE — Assessment & Plan Note (Addendum)
 Defer to management from today as listed on osteoporosis and primary arthritis of multiple joints.  Continue vitamin D  2000 units daily.  Orders:   gabapentin  (NEURONTIN ) 100 MG capsule; Take 1 capsule (100 mg total) by mouth 2 (two) times daily.   Ambulatory referral to Endocrinology   diclofenac  (VOLTAREN ) 75 MG EC tablet; Take 1 tablet (75 mg total) by mouth daily.

## 2024-04-18 NOTE — Assessment & Plan Note (Signed)
 Patient with chronic L3 compression fracture, acute to chronic compression fractures of L1,L2,L5 with up to 45% height loss, noted on lumbar MRI on 04/07/24.  Referral to [facility] endocr[facility]ology made today for further management of osteoporosis.  Briefly discussed treatment options for osteoporosis. She has a h/o GERD, hiatal hernia currently on treatment with Omeprazole  daily. Likely will benefit from either Denosumab or IV bisphosphonate vs oral.   Continue Acetaminophen  1000 mg daily prn, Gabapentin  100 mg BID and Diclofenac  75 mg daily to help with pain.  Orders:   Ambulatory referral to Endocrinology   diclofenac  (VOLTAREN ) 75 MG EC tablet; Take 1 tablet (75 mg total) by mouth daily.

## 2024-04-25 HISTORY — PX: FINGER SURGERY: SHX640

## 2024-05-03 ENCOUNTER — Other Ambulatory Visit: Payer: Self-pay | Admitting: Family Medicine

## 2024-05-03 ENCOUNTER — Inpatient Hospital Stay
Admission: RE | Admit: 2024-05-03 | Discharge: 2024-05-03 | Disposition: A | Payer: Self-pay | Source: Ambulatory Visit | Attending: Neurosurgery | Admitting: Neurosurgery

## 2024-05-03 DIAGNOSIS — Z049 Encounter for examination and observation for unspecified reason: Secondary | ICD-10-CM

## 2024-05-05 NOTE — Progress Notes (Unsigned)
 Referring Physician:  Abbey Bruckner, MD [address] [place],  [place] [address]  Primary Physician:  Abbey Bruckner, MD  History of Present Illness: 05/05/2024 [name] is here today with a chief complaint of ***  Low back pain that radiates down bilateral legs to the toes causing numbness and tingling.   Duration: ***6 weeks  Bowel/Bladder Dysfunction: none  Conservative measures:  Physical therapy: *** has not participated in Multimodal medical therapy including regular antiinflammatories: *** Prednisone , Tramadol , Tylenol , Gabapentin  Injections: no epidural steroid injections  Past Surgery: ***none  Madison Craig has ***no symptoms of cervical myelopathy.  The symptoms are causing a significant impact on the patient's life.   I have utilized the care everywhere function in epic to review the outside records available from external health systems.  Review of Systems:  A 10 point review of systems is negative, except for the pertinent positives and negatives detailed in the HPI.  Past Medical History: Past Medical History:  Diagnosis Date   Acute cystitis without hematuria 04/06/2024   Anemia    Arthritis    Basal cell carcinoma    Benign neoplasm of ascending colon    Cellulitis of finger 05/06/2023   Constipation 04/06/2024   Dyspnea 09/28/2013   Pulmonary function testing done on 02/09/2014 showed minimal airway obstruction with a lack of response to bronchodilators; her FEV1 was normal, the FEV1/FVC ratio and the FEF 25-75% were reduced; the airway resistance was normal; lung volumes were within normal limits; the diffusing capacity was high for the measured volumes.     Fall 04/06/2023   Felon of finger of left hand with lymphangitis 05/08/2023   GERD (gastroesophageal reflux disease)    Heart murmur    Heme + stool    Injury of left shoulder 03/14/2023   Macular degeneration 01/11/2014   Followed at [facility] in [place],  [place].    Multiple thyroid  nodules    Thyroid  ultrasound on 01/25/2014 showed a multinodular goiter, with a 1.5 cm nodule in the inferior left thyroid  lobe that met the criteria for ultrasound-guided biopsy.  On 02/06/2014 an ultrasound guided needle aspirate biopsy was performed of the left inferior thyroid  nodule; cytopathology findings were consistent with non-neoplastic goiter.      Osteoporosis 09/18/2014   DXA 08/16/2014 showed osteopenia of right femur neck (T-score -2.3) and osteoporosis of left forearm radius (T-score -2.9).  Lumbar spine was not utilized due to advanced degenerative changes.    Palpitations 11/11/2017   Reflux esophagitis    Right ankle pain 04/06/2023   Right leg injury 10/05/2020   SLEEP APNEA 03/22/2009   Had sleep study in [place] about 10 years, has BiPAP, cannot sleep with it on.     Syncope 09/20/2021   Tibial pain 04/06/2023   Traumatic injury 03/14/2023    Past Surgical History: Past Surgical History:  Procedure Laterality Date   BREAST SURGERY  1988   breast biopsy   CARDIAC CATHETERIZATION  2006   No Stents/[facility]   COLONOSCOPY WITH PROPOFOL  N/A 04/24/2017   Procedure: COLONOSCOPY WITH PROPOFOL ;  Surgeon: Madison Keene NOVAK, MD;  Location: [facility] ENDOSCOPY;  Service: Endoscopy;  Laterality: N/A;   COLONOSCOPY WITH PROPOFOL  N/A 06/04/2023   Procedure: COLONOSCOPY WITH PROPOFOL ;  Surgeon: Unk Madison Skiff, MD;  Location: [facility] ENDOSCOPY;  Service: Gastroenterology;  Laterality: N/A;   ENDOVENOUS ABLATION SAPHENOUS VEIN W/ LASER Right 04/17/2021   endovenous laser ablation right greater saphenous vein and stab phlebectomy > 20 incisions right  leg by Medford Blade MD   ESOPHAGOGASTRODUODENOSCOPY (EGD) WITH PROPOFOL  N/A 04/24/2017   Procedure: ESOPHAGOGASTRODUODENOSCOPY (EGD) WITH PROPOFOL ;  Surgeon: Madison Keene NOVAK, MD;  Location: [facility] ENDOSCOPY;  Service: Endoscopy;  Laterality: N/A;   eye     cateract surgery   INCISION AND DRAINAGE OF WOUND Left  05/05/2023   Procedure: IRRIGATION AND DEBRIDEMENT WOUND LEFT LONG FINGER;  Surgeon: Madison Norleen PARAS, MD;  Location: [facility] ORS;  Service: Orthopedics;  Laterality: Left;    Allergies: Allergies as of 05/13/2024   (No Known Allergies)    Medications: Current Medications[1]  Social History: Social History[2]  Family Medical History: Family History  Problem Relation Age of Onset   Stroke Mother    Hypertension Mother    Dementia Mother    Heart attack Mother    Atrial fibrillation Mother    Heart disease Father 67   Sudden death Father    Hypertension Father    Hyperlipidemia Father    Heart attack Father    Stroke Sister 54   Alcohol abuse Sister    Valvular heart disease Sister    Atrial fibrillation Sister    Aneurysm Sister    Arrhythmia Brother    Atrial fibrillation Brother    Diabetes Son 37   Hodgkin's lymphoma Son    Colon cancer Neg Hx    Breast cancer Neg Hx     Physical Examination: There were no vitals filed for this visit.  General: Patient is in no apparent distress. Attention to examination is appropriate.  Neck:   Supple.  Full range of motion.  Respiratory: Patient is breathing without any difficulty.   NEUROLOGICAL:     Awake, alert, oriented to person, place, and time.  Speech is clear and fluent.   Cranial Nerves: Pupils equal round and reactive to light.  Facial tone is symmetric.  Facial sensation is symmetric. Shoulder shrug is symmetric. Tongue protrusion is midline.    Strength: Side Biceps Triceps Deltoid Interossei Grip Wrist Ext. Wrist Flex.  R 5 5 5 5 5 5 5   L 5 5 5 5 5 5 5    Side Iliopsoas Quads Hamstring PF DF EHL  R 5 5 5 5 5 5   L 5 5 5 5 5 5    Reflexes are ***2+ and symmetric at the biceps, triceps, brachioradialis, patella and achilles.   Hoffman's is absent. Clonus is absent  Bilateral upper and lower extremity sensation is intact to light touch ***.     No evidence of dysmetria noted.  Gait is normal.     Imaging: *** I have personally reviewed the images and agree with the above interpretation.  Medical Decision Making/Assessment and Plan: [name] is a pleasant 78 y.o. female with ***  There are no diagnoses linked to this encounter.   Thank you for involving me in the care of this patient.    Penne MICAEL Sharps MD/MSCR Neurosurgery     [1]  Current Outpatient Medications:    Acetaminophen  (TYLENOL  PO), Take by mouth as needed., Disp: , Rfl:    amLODipine  (NORVASC ) 2.5 MG tablet, Take 1 tablet (2.5 mg total) by mouth at bedtime., Disp: 90 tablet, Rfl: 3   azelastine  (ASTELIN ) 0.1 % nasal spray, Place 2 sprays into both nostrils 2 (two) times daily. Use in each nostril as directed, Disp: 54 mL, Rfl: 3   cetirizine (ZYRTEC) 10 MG chewable tablet, Chew 10 mg by mouth daily., Disp: , Rfl:    cholecalciferol (VITAMIN D3) 25  MCG (1000 UNIT) tablet, Take 2,000 Units by mouth daily., Disp: , Rfl:    [START ON 05/26/2024] diclofenac  (VOLTAREN ) 75 MG EC tablet, Take 1 tablet (75 mg total) by mouth daily., Disp: 90 tablet, Rfl: 1   gabapentin  (NEURONTIN ) 100 MG capsule, Take 1 capsule (100 mg total) by mouth 2 (two) times daily., Disp: 180 capsule, Rfl: 1   omeprazole  (PRILOSEC) 40 MG capsule, Take 1 capsule (40 mg total) by mouth daily., Disp: 90 capsule, Rfl: 3 [2]  Social History Tobacco Use   Smoking status: Former    Current packs/day: 0.00    Average packs/day: 2.0 packs/day for 16.0 years (32.0 ttl pk-yrs)    Types: Cigarettes    Start date: 09/28/1965    Quit date: 09/28/1981    Years since quitting: 42.6   Smokeless tobacco: Never  Vaping Use   Vaping status: Never Used  Substance Use Topics   Alcohol use: No   Drug use: No

## 2024-05-12 ENCOUNTER — Telehealth: Payer: Self-pay

## 2024-05-12 NOTE — Telephone Encounter (Signed)
 Copied from CRM [id number]. Topic: General - Other >> May 12, 2024 10:41 AM Deleta S wrote: Reason for CRM: patient never received a call from the endo or was placed on the schedule she would like to be placed on the schedule some time after the new year. Please contact [phone]

## 2024-05-12 NOTE — Telephone Encounter (Signed)
 Referral to endo made during her last visit with me on 04/18/24.  Luke Shade, MD

## 2024-05-13 ENCOUNTER — Ambulatory Visit: Admitting: Neurosurgery

## 2024-05-13 ENCOUNTER — Encounter: Payer: Self-pay | Admitting: Neurosurgery

## 2024-05-13 VITALS — BP 118/72 | Ht 62.5 in | Wt 150.5 lb

## 2024-05-13 DIAGNOSIS — S32010A Wedge compression fracture of first lumbar vertebra, initial encounter for closed fracture: Secondary | ICD-10-CM

## 2024-05-13 DIAGNOSIS — S32050A Wedge compression fracture of fifth lumbar vertebra, initial encounter for closed fracture: Secondary | ICD-10-CM | POA: Diagnosis not present

## 2024-05-13 DIAGNOSIS — M48062 Spinal stenosis, lumbar region with neurogenic claudication: Secondary | ICD-10-CM

## 2024-05-13 DIAGNOSIS — S32000S Wedge compression fracture of unspecified lumbar vertebra, sequela: Secondary | ICD-10-CM

## 2024-05-13 DIAGNOSIS — S32020A Wedge compression fracture of second lumbar vertebra, initial encounter for closed fracture: Secondary | ICD-10-CM | POA: Diagnosis not present

## 2024-05-13 NOTE — Telephone Encounter (Signed)
 Left message for patient to give our office a call back to discuss Dr Graylon recommendations.   OK for E2C2 to give note if patient calls back. If relayed, please notify the office.

## 2024-05-17 DIAGNOSIS — M48062 Spinal stenosis, lumbar region with neurogenic claudication: Secondary | ICD-10-CM | POA: Insufficient documentation

## 2024-05-23 ENCOUNTER — Ambulatory Visit: Admitting: Neurosurgery

## 2024-05-24 ENCOUNTER — Encounter: Payer: Self-pay | Admitting: Obstetrics and Gynecology

## 2024-05-24 ENCOUNTER — Ambulatory Visit: Admitting: Obstetrics and Gynecology

## 2024-05-24 ENCOUNTER — Other Ambulatory Visit (HOSPITAL_COMMUNITY)
Admission: RE | Admit: 2024-05-24 | Discharge: 2024-05-24 | Disposition: A | Discharge status: Home / Self Care | Source: Other Acute Inpatient Hospital | Attending: Obstetrics and Gynecology | Admitting: Obstetrics and Gynecology

## 2024-05-24 VITALS — BP 101/62 | HR 79 | Ht <= 58 in | Wt 153.8 lb

## 2024-05-24 DIAGNOSIS — R82998 Other abnormal findings in urine: Secondary | ICD-10-CM | POA: Insufficient documentation

## 2024-05-24 DIAGNOSIS — R350 Frequency of micturition: Secondary | ICD-10-CM | POA: Diagnosis not present

## 2024-05-24 DIAGNOSIS — N816 Rectocele: Secondary | ICD-10-CM | POA: Insufficient documentation

## 2024-05-24 DIAGNOSIS — N3281 Overactive bladder: Secondary | ICD-10-CM | POA: Diagnosis not present

## 2024-05-24 DIAGNOSIS — R319 Hematuria, unspecified: Secondary | ICD-10-CM | POA: Diagnosis not present

## 2024-05-24 LAB — POCT URINALYSIS DIP (CLINITEK)
Blood, UA: NEGATIVE
Glucose, UA: NEGATIVE mg/dL
Nitrite, UA: POSITIVE — AB
POC PROTEIN,UA: 30 — AB
Spec Grav, UA: 1.02
Urobilinogen, UA: 1 U/dL
pH, UA: 5.5

## 2024-05-24 LAB — URINALYSIS, COMPLETE (UACMP) WITH MICROSCOPIC
Glucose, UA: NEGATIVE mg/dL
Hgb urine dipstick: NEGATIVE
Ketones, ur: NEGATIVE mg/dL
Nitrite: POSITIVE — AB
Protein, ur: 30 mg/dL — AB
Specific Gravity, Urine: 1.02 (ref 1.005–1.030)
WBC, UA: >50 WBC/hpf (ref 0–5)
pH: 5 (ref 5.0–8.0)

## 2024-05-24 NOTE — Patient Instructions (Addendum)
 Today we talked about ways to manage bladder urgency such as altering your diet to avoid irritative beverages and foods (bladder diet) as well as attempting to decrease stress and other exacerbating factors.  Avoid drinking 2-3 hours prior to bedtime. Avoid caffeine in the afternoon.   The Most Bothersome Foods* The Least Bothersome Foods*  Coffee - Regular & Decaf Tea - caffeinated Carbonated beverages - cola, non-colas, diet & caffeine-free Alcohols - Beer, Red Wine, White Wine, [address] - Grapefruit, [place], Orange, [employer] - Cranberry, Grapefruit, Orange, Pineapple Vegetables - Tomato & Tomato Products Flavor Enhancers - Hot peppers, Spicy foods, Chili, Horseradish, Vinegar, Monosodium glutamate (MSG) Artificial Sweeteners - NutraSweet, Sweet 'N Low, Equal (sweetener), Saccharin Ethnic foods - Mexican, Thai, Indian food [employer] - low-fat & whole Fruits - Bananas, Blueberries, Honeydew melon, Pears, Raisins, Watermelon Vegetables - Broccoli, [address] Sprouts, [place], Carrots, Cauliflower, [place], Cucumber, Mushrooms, Peas, Radishes, Squash, Zucchini, White potatoes, Sweet potatoes & yams Poultry - Chicken, Eggs, Turkey, [employer] - Beef, Dipl[employer] Operational [employer], Lamb Seafood - Shrimp, [place] fish, Salmon Grains - Oat, Rice Snacks - Pretzels, Popcorn  *Mitch ALF et al. Diet and its role in interstitial cystitis/bladder pain syndrome (IC/BPS) and comorbid conditions. [employer]. BJU Int. 2012 Jan 11.   You have a stage 3 (out of 4) prolapse.  We discussed the fact that it is not life threatening but there are several treatment options. For treatment of pelvic organ prolapse, we discussed options for management including expectant management, conservative management, and surgical management, such as Kegels, a pessary, pelvic floor physical therapy, and specific surgical procedures.

## 2024-05-24 NOTE — Progress Notes (Addendum)
 " New Patient Evaluation and Consultation  Referring Provider: Schermerhorn, Debby PARAS, MD PCP: Abbey Bruckner, MD Date of Service: 05/24/2024  SUBJECTIVE Chief Complaint: New Patient (Initial Visit) Madison Craig is a 78 y.o. female is here for rectocele.)  History of Present Illness: Madison Craig is a 78 y.o. White or Caucasian female seen in consultation at the request of Dr Lovetta for evaluation of prolapse.    Urinary Symptoms: Leaks urine with with movement to the bathroom. Denies SUI Leaks all day Pad use: none Patient is bothered by UI symptoms.  Day time voids- can usually hold for a few hours.  Nocturia: 5-6 times per night to void. Voiding dysfunction:  empties bladder well.  Patient does not use a catheter to empty bladder.  When urinating, patient feels she has no difficulties Drinks: 30-40oz coffee, abt 24oz bottles water per day, 8oz bottle water at night per day, occasional soda She has sleep apnea but is not using a CPAP because she did not like the one she had- was fit many years ago.   UTIs: 1 UTI's in the last year.   Denies history of blood in urine and kidney or bladder stones Susceptibility data from last 90 days. Collected Specimen Info Organism AMPICILLIN AMPICILLIN/SULBACTAM CEFAZOLIN  (URINE) CEFEPIME CEFTRIAXONE  Ciprofloxacin Ertapenem Gentamicin Susc lslt Meropenem Nitrofurantoin Susc lslt Piperacillin + Tazobactam  03/27/24 Urine, Clean Catch Escherichia coli  S  S S  S  S  S  S  S  S  S S   Collected Specimen Info Organism Trimethoprim/Sulfa  03/27/24 Urine, Clean Catch Escherichia coli  S    Pelvic Organ Prolapse Symptoms:                  Patient Admits to a feeling of a bulge the vaginal area. It has been present for a long time  Patient Admits to seeing a bulge.  This bulge is bothersome. Had a #5 cube pessary but it feel out and decided that she did not want it anymore. Previously had gellhorn but it was causing more  urinary incontinence.   Bowel Symptom: Bowel movements: 1 time(s) per day Stool consistency: soft  Straining: yes.  Splinting: no.  Incomplete evacuation: no.  Patient Denies accidental bowel leakage / fecal incontinence Bowel regimen: none  HM Colonoscopy          Upcoming     Colonoscopy (Every 3 Years) Next due on 06/03/2026    06/04/2023  COLONOSCOPY  Only the first 1 history entries have been loaded, but more history exists.               Sexual Function Sexually active: no.    Pelvic Pain Denies pelvic pain But has pain in her hips   Past Medical History:  Past Medical History:  Diagnosis Date   Acute cystitis without hematuria 04/06/2024   Anemia    Arthritis    Basal cell carcinoma    Benign neoplasm of ascending colon    Cellulitis of finger 05/06/2023   Constipation 04/06/2024   Dyspnea 09/28/2013   Pulmonary function testing done on 02/09/2014 showed minimal airway obstruction with a lack of response to bronchodilators; her FEV1 was normal, the FEV1/FVC ratio and the FEF 25-75% were reduced; the airway resistance was normal; lung volumes were within normal limits; the diffusing capacity was high for the measured volumes.     Fall 04/06/2023   Felon of finger of left hand with lymphangitis 05/08/2023   GERD (  gastroesophageal reflux disease)    Heart murmur    Heme + stool    Injury of left shoulder 03/14/2023   Macular degeneration 01/11/2014   Followed at [facility] in [place], [place].    Multiple thyroid  nodules    Thyroid  ultrasound on 01/25/2014 showed a multinodular goiter, with a 1.5 cm nodule in the inferior left thyroid  lobe that met the criteria for ultrasound-guided biopsy.  On 02/06/2014 an ultrasound guided needle aspirate biopsy was performed of the left inferior thyroid  nodule; cytopathology findings were consistent with non-neoplastic goiter.      Osteoporosis 09/18/2014   DXA 08/16/2014 showed osteopenia of right femur neck  (T-score -2.3) and osteoporosis of left forearm radius (T-score -2.9).  Lumbar spine was not utilized due to advanced degenerative changes.    Palpitations 11/11/2017   Reflux esophagitis    Right ankle pain 04/06/2023   Right leg injury 10/05/2020   SLEEP APNEA 03/22/2009   Had sleep study in [place] about 10 years, has BiPAP, cannot sleep with it on.     Syncope 09/20/2021   Tibial pain 04/06/2023   Traumatic injury 03/14/2023     Past Surgical History:   Past Surgical History:  Procedure Laterality Date   BREAST SURGERY  1988   breast biopsy   CARDIAC CATHETERIZATION  2006   No Stents/[facility]   COLONOSCOPY WITH PROPOFOL  N/A 04/24/2017   Procedure: COLONOSCOPY WITH PROPOFOL ;  Surgeon: Janalyn Keene NOVAK, MD;  Location: [facility] ENDOSCOPY;  Service: Endoscopy;  Laterality: N/A;   COLONOSCOPY WITH PROPOFOL  N/A 06/04/2023   Procedure: COLONOSCOPY WITH PROPOFOL ;  Surgeon: Unk Corinn Skiff, MD;  Location: [facility] ENDOSCOPY;  Service: Gastroenterology;  Laterality: N/A;   ENDOVENOUS ABLATION SAPHENOUS VEIN W/ LASER Right 04/17/2021   endovenous laser ablation right greater saphenous vein and stab phlebectomy > 20 incisions right leg by Medford Blade MD   ESOPHAGOGASTRODUODENOSCOPY (EGD) WITH PROPOFOL  N/A 04/24/2017   Procedure: ESOPHAGOGASTRODUODENOSCOPY (EGD) WITH PROPOFOL ;  Surgeon: Janalyn Keene NOVAK, MD;  Location: [facility] ENDOSCOPY;  Service: Endoscopy;  Laterality: N/A;   eye     cateract surgery   FINGER SURGERY Left 04/2024   INCISION AND DRAINAGE OF WOUND Left 05/05/2023   Procedure: IRRIGATION AND DEBRIDEMENT WOUND LEFT LONG FINGER;  Surgeon: Edie Norleen PARAS, MD;  Location: [facility] ORS;  Service: Orthopedics;  Laterality: Left;     Past OB/GYN History: OB History  Gravida Para Term Preterm AB Living  3 3 3   3   SAB IAB Ectopic Multiple Live Births          # Outcome Date GA Lbr Len/2nd Weight Sex Type Anes PTL Lv  3 Term      Vag-Spont     2 Term      Vag-Spont     1 Term       Vag-Spont       Menopausal: Denies vaginal bleeding since menopause  Any history of abnormal pap smears: no.   Medications: Patient has a current medication list which includes the following prescription(s): acetaminophen , amlodipine , azelastine , cetirizine, cholecalciferol, [START ON 05/26/2024] diclofenac , gabapentin , and omeprazole .   Allergies: Patient has no known allergies.   Social History: Social History[1]  Relationship status: single Patient lives alone Patient is not employed. Regular exercise: No History of abuse: No  Family History:   Family History  Problem Relation Age of Onset   Stroke Mother    Hypertension Mother    Dementia Mother    Heart attack Mother  Atrial fibrillation Mother    Heart disease Father 48   Sudden death Father    Hypertension Father    Hyperlipidemia Father    Heart attack Father    Stroke Sister 18   Alcohol abuse Sister    Valvular heart disease Sister    Atrial fibrillation Sister    Aneurysm Sister    Arrhythmia Brother    Atrial fibrillation Brother    Diabetes Son 12   Hodgkin's lymphoma Son    Colon cancer Neg Hx    Breast cancer Neg Hx      Review of Systems: Review of Systems  Constitutional:  Positive for weight loss. Negative for fever and malaise/fatigue.  Respiratory:  Positive for wheezing. Negative for cough and shortness of breath.   Cardiovascular:  Negative for chest pain, palpitations and leg swelling.  Gastrointestinal:  Negative for abdominal pain and blood in stool.  Genitourinary:  Negative for dysuria.  Musculoskeletal:  Positive for myalgias.  Skin:  Negative for rash.  Neurological:  Negative for dizziness and headaches.  Endo/Heme/Allergies:  Bruises/bleeds easily.  Psychiatric/Behavioral:  Positive for depression. The patient is not nervous/anxious.      OBJECTIVE Physical Exam: Vitals:   05/24/24 1308  BP: 101/62  Pulse: 79  Weight: 153 lb 12.8 oz (69.8 kg)  Height: 4' 9 (1.448  m)    Physical Exam Vitals reviewed. Exam conducted with a chaperone present.  Constitutional:      General: She is not in acute distress. Pulmonary:     Effort: Pulmonary effort is normal.  Abdominal:     General: There is no distension.     Palpations: Abdomen is soft.     Tenderness: There is no abdominal tenderness. There is no rebound.  Musculoskeletal:        General: No swelling. Normal range of motion.  Skin:    General: Skin is warm and dry.     Findings: No rash.  Neurological:     Mental Status: She is alert and oriented to person, place, and time.  Psychiatric:        Mood and Affect: Mood normal.        Behavior: Behavior normal.      GU / Detailed Urogynecologic Evaluation:  Pelvic Exam: Normal external female genitalia; Bartholin's and Skene's glands normal in appearance; urethral meatus normal in appearance, no urethral masses or discharge.   CST: negative  Speculum exam reveals normal vaginal mucosa with atrophy. Cervix normal appearance. Uterus normal single, nontender. Adnexa no mass, fullness, tenderness.     Pelvic floor strength I/V, puborectalis II/V external anal sphincter II/V  Pelvic floor musculature: Right levator non-tender, Right obturator non-tender, Left levator non-tender, Left obturator non-tender  POP-Q:   POP-Q  -2.5                                            Aa   -2.5                                           Ba  -7  C   7                                            Gh  4                                            Pb  7.5                                            tvl   3                                            Ap  3                                            Bp  -7                                              D      Rectal Exam:  Normal sphincter tone, large distal rectocele, large enterocoele present, no rectal masses, no sign of dyssynergia when asking the patient  to bear down.  Post-Void Residual (PVR) by Bladder Scan: In order to evaluate bladder emptying, we discussed obtaining a postvoid residual and patient agreed to this procedure.  Procedure: The ultrasound unit was placed on the patient's abdomen in the suprapubic region after the patient had voided.    Post Void Residual - 05/24/24 1540       Post Void Residual   Post Void Residual 3 mL           Laboratory Results: Lab Results  Component Value Date   COLORU yellow 05/24/2024   CLARITYU clear 05/24/2024   GLUCOSEUR negative 05/24/2024   BILIRUBINUR small (A) 05/24/2024   KETONESU negative 03/24/2017   SPECGRAV 1.020 05/24/2024   RBCUR negative 05/24/2024   PHUR 5.5 05/24/2024   PROTEINUR 30 (A) 03/27/2024   UROBILINOGEN 1.0 05/24/2024   LEUKOCYTESUR Small (1+) (A) 05/24/2024    Lab Results  Component Value Date   CREATININE 1.12 04/18/2024   CREATININE 1.30 (H) 03/27/2024   CREATININE 1.58 (H) 03/27/2024    Lab Results  Component Value Date   HGBA1C 5.4 04/18/2024    Lab Results  Component Value Date   HGB 10.2 (L) 03/27/2024     ASSESSMENT AND PLAN Ms. Lovin is a 78 y.o. with:  1. Prolapse of posterior vaginal wall   2. Overactive bladder   3. Urinary frequency   4. Leukocytes in urine   5. Hematuria, unspecified type     Prolapse of posterior vaginal wall Assessment & Plan: -For treatment of pelvic organ prolapse, we discussed options for management including expectant management, conservative management, and surgical management, such as Kegels, a pessary, pelvic floor physical therapy, and specific  surgical procedures. - She is interested in surgery but may potentially be having back surgery so would want that issue corrected first. We discussed that her prolapse is more of a quality of life issue. Reviewed option of posterior repair with SSLF and perineorrhaphy. Handouts provided for her about the surgery.  - Advised patient to return when she is  ready for surgery.    Overactive bladder Assessment & Plan: - We discussed the symptoms of overactive bladder (OAB), which include urinary urgency, urinary frequency, nocturia, with or without urge incontinence.  While we do not know the exact etiology of OAB, several treatment options exist. We discussed management including behavioral therapy (decreasing bladder irritants, urge suppression strategies, timed voids, bladder retraining), physical therapy, medication.  - Recommended eliminating caffeine and drinking more water. Avoid drinking 2-3 hours prior to bedtime and throughout the night.  - We also discussed that untreated sleep apnea may be contributing to her nocturia. Recommended f/u with her PCP.     Urinary frequency -     POCT URINALYSIS DIP (CLINITEK)  Leukocytes in urine -     Urine Culture; Future  Hematuria, unspecified type -     Urinalysis, Complete w Microscopic     Rosaline LOISE Caper, MD        [1]  Social History Tobacco Use   Smoking status: Former    Current packs/day: 0.00    Average packs/day: 2.0 packs/day for 16.0 years (32.0 ttl pk-yrs)    Types: Cigarettes    Start date: 09/28/1965    Quit date: 09/28/1981    Years since quitting: 42.6   Smokeless tobacco: Never  Vaping Use   Vaping status: Never Used  Substance Use Topics   Alcohol use: No   Drug use: No   "

## 2024-05-24 NOTE — Assessment & Plan Note (Signed)
-  For treatment of pelvic organ prolapse, we discussed options for management including expectant management, conservative management, and surgical management, such as Kegels, a pessary, pelvic floor physical therapy, and specific surgical procedures. - She is interested in surgery but may potentially be having back surgery so would want that issue corrected first. We discussed that her prolapse is more of a quality of life issue. Reviewed option of posterior repair with SSLF and perineorrhaphy. Handouts provided for her about the surgery.  - Advised patient to return when she is ready for surgery.

## 2024-05-24 NOTE — Assessment & Plan Note (Signed)
-   We discussed the symptoms of overactive bladder (OAB), which include urinary urgency, urinary frequency, nocturia, with or without urge incontinence.  While we do not know the exact etiology of OAB, several treatment options exist. We discussed management including behavioral therapy (decreasing bladder irritants, urge suppression strategies, timed voids, bladder retraining), physical therapy, medication.  - Recommended eliminating caffeine and drinking more water. Avoid drinking 2-3 hours prior to bedtime and throughout the night.  - We also discussed that untreated sleep apnea may be contributing to her nocturia. Recommended f/u with her PCP.

## 2024-05-27 ENCOUNTER — Ambulatory Visit: Payer: Self-pay | Admitting: Obstetrics and Gynecology

## 2024-05-27 DIAGNOSIS — R82998 Other abnormal findings in urine: Secondary | ICD-10-CM

## 2024-05-27 LAB — URINE CULTURE: Culture: >=100000 — AB

## 2024-05-27 MED ORDER — SULFAMETHOXAZOLE-TRIMETHOPRIM 800-160 MG PO TABS: 1.0000 | ORAL_TABLET | Freq: Two times a day (BID) | ORAL | 0 refills | Status: AC

## 2024-06-09 ENCOUNTER — Encounter: Payer: Self-pay | Admitting: Obstetrics and Gynecology

## 2024-07-18 ENCOUNTER — Ambulatory Visit: Admitting: Neurosurgery

## 2024-07-18 ENCOUNTER — Ambulatory Visit

## 2024-08-16 ENCOUNTER — Ambulatory Visit

## 2025-01-16 ENCOUNTER — Ambulatory Visit
